# Patient Record
Sex: Male | Born: 1980
Health system: Southern US, Community
[De-identification: ages and names within clinical notes are randomized; demographics above are authoritative.]

## PROBLEM LIST (undated history)

## (undated) DIAGNOSIS — Z91199 Patient's noncompliance with other medical treatment and regimen due to unspecified reason: Secondary | ICD-10-CM

## (undated) DIAGNOSIS — G43909 Migraine, unspecified, not intractable, without status migrainosus: Secondary | ICD-10-CM

## (undated) DIAGNOSIS — E7800 Pure hypercholesterolemia, unspecified: Secondary | ICD-10-CM

## (undated) DIAGNOSIS — I2510 Atherosclerotic heart disease of native coronary artery without angina pectoris: Secondary | ICD-10-CM

## (undated) DIAGNOSIS — N4400 Torsion of testis, unspecified: Secondary | ICD-10-CM

## (undated) DIAGNOSIS — Z9119 Patient's noncompliance with other medical treatment and regimen: Secondary | ICD-10-CM

## (undated) DIAGNOSIS — J45909 Unspecified asthma, uncomplicated: Secondary | ICD-10-CM

## (undated) DIAGNOSIS — I214 Non-ST elevation (NSTEMI) myocardial infarction: Secondary | ICD-10-CM

## (undated) DIAGNOSIS — R7303 Prediabetes: Secondary | ICD-10-CM

## (undated) DIAGNOSIS — I10 Essential (primary) hypertension: Secondary | ICD-10-CM

## (undated) HISTORY — PX: INCISION / DRAINAGE HAND / FINGER: SUR695

## (undated) HISTORY — DX: Prediabetes: R73.03

## (undated) HISTORY — PX: TESTICLE TORSION REDUCTION: SHX795

---

## 2010-10-05 ENCOUNTER — Ambulatory Visit: Payer: Self-pay | Admitting: Diagnostic Radiology

## 2010-10-05 ENCOUNTER — Emergency Department (HOSPITAL_BASED_OUTPATIENT_CLINIC_OR_DEPARTMENT_OTHER): Admission: EM | Admit: 2010-10-05 | Discharge: 2010-10-06 | Payer: Self-pay | Admitting: Emergency Medicine

## 2010-10-09 ENCOUNTER — Emergency Department (HOSPITAL_BASED_OUTPATIENT_CLINIC_OR_DEPARTMENT_OTHER): Admission: EM | Admit: 2010-10-09 | Discharge: 2010-10-09 | Payer: Self-pay | Admitting: Emergency Medicine

## 2010-10-11 ENCOUNTER — Ambulatory Visit (HOSPITAL_COMMUNITY): Admission: AD | Admit: 2010-10-11 | Discharge: 2010-10-11 | Payer: Self-pay | Admitting: Orthopedic Surgery

## 2010-10-11 ENCOUNTER — Encounter: Payer: Self-pay | Admitting: Emergency Medicine

## 2010-10-11 ENCOUNTER — Ambulatory Visit: Payer: Self-pay | Admitting: Diagnostic Radiology

## 2011-03-14 LAB — DIFFERENTIAL
Basophils Absolute: 0.2 10*3/uL — ABNORMAL HIGH (ref 0.0–0.1)
Basophils Relative: 2 % — ABNORMAL HIGH (ref 0–1)
Monocytes Absolute: 1 10*3/uL (ref 0.1–1.0)
Neutro Abs: 6.6 10*3/uL (ref 1.7–7.7)
Neutrophils Relative %: 56 % (ref 43–77)

## 2011-03-14 LAB — ANAEROBIC CULTURE

## 2011-03-14 LAB — CULTURE, ROUTINE-ABSCESS

## 2011-03-14 LAB — CBC
HCT: 43.6 % (ref 39.0–52.0)
MCHC: 33.7 g/dL (ref 30.0–36.0)
RDW: 13.3 % (ref 11.5–15.5)

## 2012-10-17 ENCOUNTER — Encounter (HOSPITAL_BASED_OUTPATIENT_CLINIC_OR_DEPARTMENT_OTHER): Payer: Self-pay | Admitting: *Deleted

## 2012-10-17 ENCOUNTER — Emergency Department (HOSPITAL_BASED_OUTPATIENT_CLINIC_OR_DEPARTMENT_OTHER)
Admission: EM | Admit: 2012-10-17 | Discharge: 2012-10-17 | Disposition: A | Discharge status: Home / Self Care | Payer: Self-pay | Attending: Emergency Medicine | Admitting: Emergency Medicine

## 2012-10-17 DIAGNOSIS — M109 Gout, unspecified: Secondary | ICD-10-CM

## 2012-10-17 DIAGNOSIS — Z885 Allergy status to narcotic agent status: Secondary | ICD-10-CM | POA: Insufficient documentation

## 2012-10-17 HISTORY — DX: Torsion of testis, unspecified: N44.00

## 2012-10-17 MED ORDER — PREDNISONE 20 MG PO TABS
40.0000 mg | ORAL_TABLET | Freq: Once | ORAL | Status: AC
  Administered 2012-10-17: 40 mg via ORAL
  Filled 2012-10-17: qty 2

## 2012-10-17 MED ORDER — HYDROCODONE-ACETAMINOPHEN 5-325 MG PO TABS: ORAL_TABLET | ORAL | Status: DC

## 2012-10-17 MED ORDER — COLCHICINE 0.6 MG PO TABS
1.2000 mg | ORAL_TABLET | Freq: Once | ORAL | Status: AC
  Administered 2012-10-17: 1.2 mg via ORAL
  Filled 2012-10-17: qty 2

## 2012-10-17 MED ORDER — COLCHICINE 0.6 MG PO TABS: 0.6000 mg | ORAL_TABLET | Freq: Once | ORAL | Status: DC

## 2012-10-17 NOTE — ED Notes (Signed)
Pt presents to ED today with left thumb swelling and pain for the last 3 days.

## 2012-10-17 NOTE — ED Provider Notes (Signed)
History     CSN: [id number]  Arrival date & time 10/17/12  2130   First MD Initiated Contact with Patient 10/17/12 [phone]      Chief Complaint  Patient presents with  . Extremity Pain    (Consider location/radiation/quality/duration/timing/severity/associated sxs/prior treatment) HPI Comments: Pain to left thumb, intense, no injury that began about 3 days ago.  Pt has prior h/o abscess to finger, this pain is not similar.  Thumb is swollen, intense pain with light touch, worse with movement of thumb.  No fevers.  No drainage.  Pt is right handed.  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    Patient is a 31 y.o. male presenting with extremity pain. The history is provided by the patient.  Extremity Pain    Past Medical History  Diagnosis Date  . Abscess of finger   . Torsion, testicular     Past Surgical History  Procedure Date  . Incision / drainage hand / finger   . Testicle torsion reduction     History reviewed. No pertinent family history.  History  Substance Use Topics  . Smoking status: Not on file  . Smokeless tobacco: Not on file  . Alcohol Use: Yes      Review of Systems  Constitutional: Negative for fever.  Musculoskeletal: Positive for joint swelling and arthralgias.  Skin: Negative for color change, pallor and wound.  Neurological: Negative for weakness and numbness.    Allergies  Codeine  Home Medications   Current Outpatient Rx  Name Route Sig Dispense Refill  . COLCHICINE 0.6 MG PO TABS Oral Take 1 tablet (0.6 mg total) by mouth once. Take 2 tablets now, then one tablet 1 hour after initial dose.  No more than 3 tablets per attack 6 tablet 0  . HYDROCODONE-ACETAMINOPHEN 5-325 MG PO TABS  1-2 tablets po q 6 hours prn moderate to severe pain 20 tablet 0    BP 165/96  Pulse 80  Temp 97.7 F (36.5 C)  Resp 20  SpO2 100%  Physical Exam  Nursing note and vitals reviewed. Constitutional: He appears well-developed and  well-nourished.  HENT:  Head: Normocephalic.  Pulmonary/Chest: Effort normal. No respiratory distress.  Musculoskeletal: He exhibits tenderness.       Left hand: He exhibits decreased range of motion, tenderness and swelling. He exhibits no bony tenderness, normal capillary refill and no deformity. normal sensation noted. Decreased strength noted. He exhibits thumb/finger opposition.       Hands: Neurological: He is alert.  Skin: Skin is warm and dry. No rash noted.  Psychiatric: He has a normal mood and affect.    ED Course  Procedures (including critical care time)  Labs Reviewed - No data to display No results found.   1. Gout       MDM  Pt's symptoms suggestive of gout.  No injury.  No appearance of cellulitis or abscess at this time.  Although prior h/o abscess, no abrasions, or other obvious violation of skin.  Pt instructed to return or follow up with Dr. Merlyn Lot whom he had seen previously if symptoms worsening.  Pt also instructed to have BP rechecked in future.  Referrals given to various local PCP clinics.          Gavin Pound. Oletta Lamas, MD 10/17/12 2123

## 2012-10-17 NOTE — ED Notes (Signed)
Pt reports no injury or trauma to area.  No redness or deformity noted.  Pt has minimal swelling around joint.  Pt has tried several otc remedies with no relief at home

## 2012-10-17 NOTE — Discharge Instructions (Signed)
 Gout Gout is an inflammatory condition (arthritis) caused by a buildup of uric acid crystals in the joints. Uric acid is a chemical that is normally present in the blood. Under some circumstances, uric acid can form into crystals in your joints. This causes joint redness, soreness, and swelling (inflammation). Repeat attacks are common. Over time, uric acid crystals can form into masses (tophi) near a joint, causing disfigurement. Gout is treatable and often preventable. CAUSES  The disease begins with elevated levels of uric acid in the blood. Uric acid is produced by your body when it breaks down a naturally found substance called purines. This also happens when you eat certain foods such as meats and fish. Causes of an elevated uric acid level include:  Being passed down from parent to child (heredity).  Diseases that cause increased uric acid production (obesity, psoriasis, some cancers).  Excessive alcohol use.  Diet, especially diets rich in meat and seafood.  Medicines, including certain cancer-fighting drugs (chemotherapy), diuretics, and aspirin .  Chronic kidney disease. The kidneys are no longer able to remove uric acid well.  Problems with metabolism. Conditions strongly associated with gout include:  Obesity.  High blood pressure.  High cholesterol.  Diabetes. Not everyone with elevated uric acid levels gets gout. It is not understood why some people get gout and others do not. Surgery, joint injury, and eating too much of certain foods are some of the factors that can lead to gout. SYMPTOMS   An attack of gout comes on quickly. It causes intense pain with redness, swelling, and warmth in a joint.  Fever can occur.  Often, only one joint is involved. Certain joints are more commonly involved:  Base of the big toe.  Knee.  Ankle.  Wrist.  Finger. Without treatment, an attack usually goes away in a few days to weeks. Between attacks, you usually will not have  symptoms, which is different from many other forms of arthritis. DIAGNOSIS  Your caregiver will suspect gout based on your symptoms and exam. Removal of fluid from the joint (arthrocentesis) is done to check for uric acid crystals. Your caregiver will give you a medicine that numbs the area (local anesthetic) and use a needle to remove joint fluid for exam. Gout is confirmed when uric acid crystals are seen in joint fluid, using a special microscope. Sometimes, blood, urine, and X-ray tests are also used. TREATMENT  There are 2 phases to gout treatment: treating the sudden onset (acute) attack and preventing attacks (prophylaxis). Treatment of an Acute Attack  Medicines are used. These include anti-inflammatory medicines or steroid medicines.  An injection of steroid medicine into the affected joint is sometimes necessary.  The painful joint is rested. Movement can worsen the arthritis.  You may use warm or cold treatments on painful joints, depending which works best for you.  Discuss the use of coffee, vitamin C, or cherries with your caregiver. These may be helpful treatment options. Treatment to Prevent Attacks After the acute attack subsides, your caregiver may advise prophylactic medicine. These medicines either help your kidneys eliminate uric acid from your body or decrease your uric acid production. You may need to stay on these medicines for a very long time. The early phase of treatment with prophylactic medicine can be associated with an increase in acute gout attacks. For this reason, during the first few months of treatment, your caregiver may also advise you to take medicines usually used for acute gout treatment. Be sure you understand your caregiver's directions.  You should also discuss dietary treatment with your caregiver. Certain foods such as meats and fish can increase uric acid levels. Other foods such as dairy can decrease levels. Your caregiver can give you a list of foods  to avoid. HOME CARE INSTRUCTIONS   Do not take aspirin  to relieve pain. This raises uric acid levels.  Only take over-the-counter or prescription medicines for pain, discomfort, or fever as directed by your caregiver.  Rest the joint as much as possible. When in bed, keep sheets and blankets off painful areas.  Keep the affected joint raised (elevated).  Use crutches if the painful joint is in your leg.  Drink enough water and fluids to keep your urine clear or pale yellow. This helps your body get rid of uric acid. Do not drink alcoholic beverages. They slow the passage of uric acid.  Follow your caregiver's dietary instructions. Pay careful attention to the amount of protein you eat. Your daily diet should emphasize fruits, vegetables, whole grains, and fat-free or low-fat milk products.  Maintain a healthy body weight. SEEK MEDICAL CARE IF:   You have an oral temperature above 102 F (38.9 C).  You develop diarrhea, vomiting, or any side effects from medicines.  You do not feel better in 24 hours, or you are getting worse. SEEK IMMEDIATE MEDICAL CARE IF:   Your joint becomes suddenly more tender and you have:  Chills.  An oral temperature above 102 F (38.9 C), not controlled by medicine. MAKE SURE YOU:   Understand these instructions.  Will watch your condition.  Will get help right away if you are not doing well or get worse. Document Released: 12/13/2000 Document Revised: 03/09/2012 Document Reviewed: 03/26/2010 [facility] Patient Information 2013 [place], [place].   Narcotic and benzodiazepine use may cause drowsiness, slowed breathing or dependence.  Please use with caution and do not drive, operate machinery or watch young children alone while taking them.  Taking combinations of these medications or drinking alcohol will potentiate these effects.

## 2012-10-19 ENCOUNTER — Emergency Department (HOSPITAL_BASED_OUTPATIENT_CLINIC_OR_DEPARTMENT_OTHER)
Admission: EM | Admit: 2012-10-19 | Discharge: 2012-10-19 | Disposition: A | Discharge status: Home / Self Care | Payer: Self-pay | Attending: Emergency Medicine | Admitting: Emergency Medicine

## 2012-10-19 ENCOUNTER — Encounter (HOSPITAL_BASED_OUTPATIENT_CLINIC_OR_DEPARTMENT_OTHER): Payer: Self-pay | Admitting: Emergency Medicine

## 2012-10-19 DIAGNOSIS — IMO0002 Reserved for concepts with insufficient information to code with codable children: Secondary | ICD-10-CM | POA: Insufficient documentation

## 2012-10-19 DIAGNOSIS — J45909 Unspecified asthma, uncomplicated: Secondary | ICD-10-CM | POA: Insufficient documentation

## 2012-10-19 DIAGNOSIS — F17200 Nicotine dependence, unspecified, uncomplicated: Secondary | ICD-10-CM | POA: Insufficient documentation

## 2012-10-19 DIAGNOSIS — Z79899 Other long term (current) drug therapy: Secondary | ICD-10-CM | POA: Insufficient documentation

## 2012-10-19 HISTORY — DX: Unspecified asthma, uncomplicated: J45.909

## 2012-10-19 MED ORDER — OXYCODONE-ACETAMINOPHEN 5-325 MG PO TABS
1.0000 | ORAL_TABLET | Freq: Once | ORAL | Status: AC
  Administered 2012-10-19: 1 via ORAL
  Filled 2012-10-19 (×2): qty 1

## 2012-10-19 MED ORDER — DOXYCYCLINE HYCLATE 100 MG PO TABS
100.0000 mg | ORAL_TABLET | Freq: Once | ORAL | Status: AC
  Administered 2012-10-19: 100 mg via ORAL
  Filled 2012-10-19: qty 1

## 2012-10-19 MED ORDER — DOXYCYCLINE HYCLATE 100 MG PO CAPS: 100.0000 mg | ORAL_CAPSULE | Freq: Two times a day (BID) | ORAL | Status: DC

## 2012-10-19 MED ORDER — OXYCODONE-ACETAMINOPHEN 5-325 MG PO TABS: ORAL_TABLET | ORAL | Status: DC

## 2012-10-19 NOTE — Progress Notes (Signed)
7:35 PM Pt with pain and swelling of the left thumb, which has now extended up to the thenar eminence.  Thought to be gout 2 days ago, no relief with treatment with colchicine.  Exam shows a felon of the left thumb.  He has seen Dr. Merlyn Lot in the past for a hand infection, so will call Dr. Merrilee Seashore group.

## 2012-10-19 NOTE — ED Notes (Signed)
Pain in left thumb x5 days.  Sts he was seen here 2 days ago and diagnosed with gout.  Med not working.

## 2012-10-19 NOTE — ED Provider Notes (Signed)
History     CSN: [id number]  Arrival date & time 10/19/12  4098   First MD Initiated Contact with Patient 10/19/12 1757      Chief Complaint  Patient presents with  . Hand Pain    (Consider location/radiation/quality/duration/timing/severity/associated sxs/prior treatment) HPI Comments: Pt denies any injury to L thumb.  Was seen here 2 days ago for same and dx with gout.  Taking colchicine and hydrocodone with no improvement or pain relief.  No fever or chills.   R hand dominant.  No other complaints.  Patient is a 31 y.o. male presenting with hand pain. The history is provided by the patient. No language interpreter was used.  Hand Pain This is a new problem. Episode onset: 5 days ago. Pertinent negatives include no chills, fever, joint swelling, numbness or weakness. Exacerbated by: palpation.    Past Medical History  Diagnosis Date  . Abscess of finger   . Torsion, testicular   . Asthma     Past Surgical History  Procedure Date  . Incision / drainage hand / finger   . Testicle torsion reduction     No family history on file.  History  Substance Use Topics  . Smoking status: Current Every Day Smoker -- 0.5 packs/day  . Smokeless tobacco: Never Used  . Alcohol Use: Yes      Review of Systems  Constitutional: Negative for fever and chills.  Musculoskeletal: Negative for joint swelling.       Thumb pain   Neurological: Negative for weakness and numbness.  All other systems reviewed and are negative.    Allergies  Codeine  Home Medications   Current Outpatient Rx  Name Route Sig Dispense Refill  . HYDROCODONE-ACETAMINOPHEN 5-325 MG PO TABS  1-2 tablets po q 6 hours prn moderate to severe pain 20 tablet 0  . COLCHICINE 0.6 MG PO TABS Oral Take 1 tablet (0.6 mg total) by mouth once. Take 2 tablets now, then one tablet 1 hour after initial dose.  No more than 3 tablets per attack 6 tablet 0  . DOXYCYCLINE HYCLATE 100 MG PO CAPS Oral Take 1 capsule (100 mg  total) by mouth 2 (two) times daily. 20 capsule 0  . OXYCODONE-ACETAMINOPHEN 5-325 MG PO TABS  One tab po q 4-6 hrs prn pain 20 tablet 0    BP 172/97  Pulse 74  Temp 98.5 F (36.9 C) (Oral)  Resp 16  Ht 5\' 8"  (1.727 m)  Wt 225 lb (102.059 kg)  BMI 34.21 kg/m2  SpO2 100%  Physical Exam  Nursing note and vitals reviewed. Constitutional: He is oriented to person, place, and time. He appears well-developed and well-nourished.  HENT:  Head: Normocephalic and atraumatic.  Eyes: EOM are normal.  Neck: Normal range of motion.  Cardiovascular: Normal rate, regular rhythm, normal heart sounds and intact distal pulses.   Pulmonary/Chest: Effort normal and breath sounds normal. No respiratory distress.  Abdominal: Soft. He exhibits no distension. There is no tenderness.  Musculoskeletal: He exhibits tenderness.       Hands: Neurological: He is alert and oriented to person, place, and time.  Skin: Skin is warm and dry.  Psychiatric: He has a normal mood and affect. Judgment normal.    ED Course  Procedures (including critical care time)  Labs Reviewed - No data to display No results found. 1945-i spoke with dr. Mina Marble who is on office call for dr. Merlyn Lot.  Pt saw dr. Merlyn Lot last year for a R hand  problem  Dr. Mina Marble recommend doxycyline and office follow up tomorrow.  1. Felon       MDM  rx-doxycycline, 20 Percocet, 20 Call dr. Merlyn Lot tomorrow AM.        Evalina Field, PA 10/19/12 2007

## 2012-10-19 NOTE — ED Notes (Signed)
PA at bedside.

## 2012-10-19 NOTE — ED Notes (Signed)
Pt. Has noted edema in the R thumb with pain...  Pt. Thumb is firm with edema present .

## 2012-10-20 NOTE — ED Provider Notes (Signed)
Medical screening examination/treatment/procedure(s) were conducted as a shared visit with non-physician practitioner(s) and myself.  I personally evaluated the patient during the encounter Pt with pain and swelling of the left thumb, which has now extended up to the thenar eminence. Thought to be gout 2 days ago, no relief with treatment with colchicine. Exam shows a felon of the left thumb. He has seen Dr. Merlyn Lot in the past for a hand infection, so will call Dr. Merrilee Seashore group.       Carleene Cooper III, MD 10/20/12 1235

## 2012-10-21 ENCOUNTER — Encounter (HOSPITAL_COMMUNITY): Payer: Self-pay | Admitting: Surgery

## 2012-10-21 ENCOUNTER — Ambulatory Visit (HOSPITAL_COMMUNITY)
Admission: AD | Admit: 2012-10-21 | Discharge: 2012-10-21 | Disposition: A | Discharge status: Home / Self Care | Payer: Self-pay | Source: Ambulatory Visit | Attending: Orthopedic Surgery | Admitting: Orthopedic Surgery

## 2012-10-21 ENCOUNTER — Other Ambulatory Visit: Payer: Self-pay | Admitting: Orthopedic Surgery

## 2012-10-21 ENCOUNTER — Encounter (HOSPITAL_COMMUNITY): Payer: Self-pay | Admitting: Anesthesiology

## 2012-10-21 ENCOUNTER — Ambulatory Visit (HOSPITAL_COMMUNITY): Payer: Self-pay | Admitting: Anesthesiology

## 2012-10-21 ENCOUNTER — Encounter (HOSPITAL_COMMUNITY)
Admission: AD | Disposition: A | Discharge status: Home / Self Care | Payer: Self-pay | Source: Ambulatory Visit | Attending: Orthopedic Surgery

## 2012-10-21 DIAGNOSIS — IMO0002 Reserved for concepts with insufficient information to code with codable children: Secondary | ICD-10-CM | POA: Diagnosis present

## 2012-10-21 DIAGNOSIS — L03019 Cellulitis of unspecified finger: Secondary | ICD-10-CM | POA: Insufficient documentation

## 2012-10-21 DIAGNOSIS — F17200 Nicotine dependence, unspecified, uncomplicated: Secondary | ICD-10-CM | POA: Insufficient documentation

## 2012-10-21 DIAGNOSIS — L02519 Cutaneous abscess of unspecified hand: Secondary | ICD-10-CM | POA: Insufficient documentation

## 2012-10-21 HISTORY — PX: I & D EXTREMITY: SHX5045

## 2012-10-21 LAB — POCT I-STAT 4, (NA,K, GLUC, HGB,HCT)
Glucose, Bld: 82 mg/dL (ref 70–99)
HCT: 54 % — ABNORMAL HIGH (ref 39.0–52.0)
Hemoglobin: 18.4 g/dL — ABNORMAL HIGH (ref 13.0–17.0)
Potassium: 4 mEq/L (ref 3.5–5.1)
Sodium: 137 mEq/L (ref 135–145)

## 2012-10-21 LAB — SURGICAL PCR SCREEN
MRSA, PCR: POSITIVE — AB
Staphylococcus aureus: POSITIVE — AB

## 2012-10-21 SURGERY — IRRIGATION AND DEBRIDEMENT EXTREMITY
Anesthesia: General | Site: Thumb | Laterality: Left | Wound class: Dirty or Infected

## 2012-10-21 MED ORDER — LACTATED RINGERS IV SOLN
INTRAVENOUS | Status: DC | PRN
  Administered 2012-10-21: 16:00:00 via INTRAVENOUS

## 2012-10-21 MED ORDER — PROMETHAZINE HCL 25 MG/ML IJ SOLN: 6.2500 mg | INTRAMUSCULAR | Status: DC | PRN

## 2012-10-21 MED ORDER — FENTANYL CITRATE 0.05 MG/ML IJ SOLN
INTRAMUSCULAR | Status: AC
  Filled 2012-10-21: qty 2

## 2012-10-21 MED ORDER — CHLORHEXIDINE GLUCONATE 4 % EX LIQD: 60.0000 mL | Freq: Once | CUTANEOUS | Status: DC

## 2012-10-21 MED ORDER — FENTANYL CITRATE 0.05 MG/ML IJ SOLN
25.0000 ug | INTRAMUSCULAR | Status: DC | PRN
  Administered 2012-10-21: 50 ug via INTRAVENOUS

## 2012-10-21 MED ORDER — LACTATED RINGERS IV SOLN: INTRAVENOUS | Status: DC

## 2012-10-21 MED ORDER — MUPIROCIN 2 % EX OINT
TOPICAL_OINTMENT | Freq: Two times a day (BID) | CUTANEOUS | Status: DC
  Filled 2012-10-21: qty 22

## 2012-10-21 MED ORDER — BUPIVACAINE HCL (PF) 0.25 % IJ SOLN
INTRAMUSCULAR | Status: DC | PRN
  Administered 2012-10-21: 4 mL

## 2012-10-21 MED ORDER — LIDOCAINE HCL (CARDIAC) 10 MG/ML IV SOLN
INTRAVENOUS | Status: DC | PRN
  Administered 2012-10-21: 100 mg via INTRAVENOUS

## 2012-10-21 MED ORDER — CEFAZOLIN SODIUM-DEXTROSE 2-3 GM-% IV SOLR
INTRAVENOUS | Status: AC
  Filled 2012-10-21: qty 50

## 2012-10-21 MED ORDER — MEPERIDINE HCL 25 MG/ML IJ SOLN: 6.2500 mg | INTRAMUSCULAR | Status: DC | PRN

## 2012-10-21 MED ORDER — FENTANYL CITRATE 0.05 MG/ML IJ SOLN
INTRAMUSCULAR | Status: DC | PRN
  Administered 2012-10-21 (×2): 50 ug via INTRAVENOUS

## 2012-10-21 MED ORDER — MUPIROCIN 2 % EX OINT
TOPICAL_OINTMENT | CUTANEOUS | Status: AC
  Filled 2012-10-21: qty 22

## 2012-10-21 MED ORDER — CEFAZOLIN SODIUM-DEXTROSE 2-3 GM-% IV SOLR
INTRAVENOUS | Status: DC | PRN
  Administered 2012-10-21: 2 g via INTRAVENOUS

## 2012-10-21 MED ORDER — OXYCODONE-ACETAMINOPHEN 5-325 MG PO TABS: 1.0000 | ORAL_TABLET | ORAL | Status: DC | PRN

## 2012-10-21 MED ORDER — BUPIVACAINE HCL (PF) 0.25 % IJ SOLN
INTRAMUSCULAR | Status: AC
  Filled 2012-10-21: qty 30

## 2012-10-21 MED ORDER — PROPOFOL 10 MG/ML IV BOLUS
INTRAVENOUS | Status: DC | PRN
  Administered 2012-10-21: 300 mg via INTRAVENOUS

## 2012-10-21 MED ORDER — LACTATED RINGERS IV SOLN
INTRAVENOUS | Status: DC
  Administered 2012-10-21: 14:00:00 via INTRAVENOUS

## 2012-10-21 MED ORDER — SODIUM CHLORIDE 0.9 % IR SOLN
Status: DC | PRN
  Administered 2012-10-21: 1

## 2012-10-21 SURGICAL SUPPLY — 49 items
BANDAGE CONFORM 2  STR LF (GAUZE/BANDAGES/DRESSINGS) IMPLANT
BANDAGE ELASTIC 3 VELCRO ST LF (GAUZE/BANDAGES/DRESSINGS) ×2 IMPLANT
BANDAGE ELASTIC 4 VELCRO ST LF (GAUZE/BANDAGES/DRESSINGS) ×2 IMPLANT
BANDAGE GAUZE ELAST BULKY 4 IN (GAUZE/BANDAGES/DRESSINGS) ×4 IMPLANT
BNDG CMPR 9X4 STRL LF SNTH (GAUZE/BANDAGES/DRESSINGS)
BNDG COHESIVE 1X5 TAN STRL LF (GAUZE/BANDAGES/DRESSINGS) ×2 IMPLANT
BNDG ESMARK 4X9 LF (GAUZE/BANDAGES/DRESSINGS) IMPLANT
CLOTH BEACON ORANGE TIMEOUT ST (SAFETY) ×2 IMPLANT
CORDS BIPOLAR (ELECTRODE) ×2 IMPLANT
COVER SURGICAL LIGHT HANDLE (MISCELLANEOUS) ×4 IMPLANT
CUFF TOURNIQUET SINGLE 18IN (TOURNIQUET CUFF) ×2 IMPLANT
DECANTER SPIKE VIAL GLASS SM (MISCELLANEOUS) ×2 IMPLANT
DRAIN PENROSE 1/4X12 LTX STRL (WOUND CARE) IMPLANT
DRAPE OEC MINIVIEW 54X84 (DRAPES) IMPLANT
DRAPE SURG 17X23 STRL (DRAPES) ×2 IMPLANT
DRSG PAD ABDOMINAL 8X10 ST (GAUZE/BANDAGES/DRESSINGS) ×4 IMPLANT
DURAPREP 26ML APPLICATOR (WOUND CARE) IMPLANT
ELECT REM PT RETURN 9FT ADLT (ELECTROSURGICAL)
ELECTRODE REM PT RTRN 9FT ADLT (ELECTROSURGICAL) IMPLANT
GAUZE PACKING IODOFORM 1/4X5 (PACKING) ×2 IMPLANT
GAUZE XEROFORM 1X8 LF (GAUZE/BANDAGES/DRESSINGS) ×2 IMPLANT
GLOVE BIO SURGEON STRL SZ8.5 (GLOVE) ×2 IMPLANT
GOWN PREVENTION PLUS XLARGE (GOWN DISPOSABLE) ×2 IMPLANT
GOWN STRL NON-REIN LRG LVL3 (GOWN DISPOSABLE) ×2 IMPLANT
HANDPIECE INTERPULSE COAX TIP (DISPOSABLE)
KIT BASIN OR (CUSTOM PROCEDURE TRAY) ×2 IMPLANT
KIT ROOM TURNOVER OR (KITS) ×2 IMPLANT
MANIFOLD NEPTUNE II (INSTRUMENTS) ×2 IMPLANT
NEEDLE HYPO 25GX1X1/2 BEV (NEEDLE) IMPLANT
NEEDLE HYPO 25X1 1.5 SAFETY (NEEDLE) ×2 IMPLANT
NS IRRIG 1000ML POUR BTL (IV SOLUTION) ×2 IMPLANT
PACK ORTHO EXTREMITY (CUSTOM PROCEDURE TRAY) ×2 IMPLANT
PAD ARMBOARD 7.5X6 YLW CONV (MISCELLANEOUS) ×4 IMPLANT
PAD CAST 4YDX4 CTTN HI CHSV (CAST SUPPLIES) ×2 IMPLANT
PADDING CAST COTTON 4X4 STRL (CAST SUPPLIES) ×4
SET HNDPC FAN SPRY TIP SCT (DISPOSABLE) IMPLANT
SPONGE GAUZE 4X4 12PLY (GAUZE/BANDAGES/DRESSINGS) ×2 IMPLANT
SPONGE LAP 18X18 X RAY DECT (DISPOSABLE) ×2 IMPLANT
SUCTION FRAZIER TIP 10 FR DISP (SUCTIONS) ×2 IMPLANT
SUT VICRYL RAPIDE 4/0 PS 2 (SUTURE) ×2 IMPLANT
SYR 20CC LL (SYRINGE) IMPLANT
SYR CONTROL 10ML LL (SYRINGE) ×2 IMPLANT
TOWEL OR 17X24 6PK STRL BLUE (TOWEL DISPOSABLE) ×2 IMPLANT
TOWEL OR 17X26 10 PK STRL BLUE (TOWEL DISPOSABLE) ×2 IMPLANT
TUBE ANAEROBIC SPECIMEN COL (MISCELLANEOUS) ×2 IMPLANT
TUBE CONNECTING 12X1/4 (SUCTIONS) ×2 IMPLANT
UNDERPAD 30X30 INCONTINENT (UNDERPADS AND DIAPERS) ×2 IMPLANT
WATER STERILE IRR 1000ML POUR (IV SOLUTION) ×2 IMPLANT
YANKAUER SUCT BULB TIP NO VENT (SUCTIONS) ×2 IMPLANT

## 2012-10-21 NOTE — Transfer of Care (Signed)
Immediate Anesthesia Transfer of Care Note  Patient: Mike Frazier  Procedure(s) Performed: Procedure(s) (LRB) with comments: IRRIGATION AND DEBRIDEMENT EXTREMITY (Left)  Patient Location: PACU  Anesthesia Type: General  Level of Consciousness: awake, alert , oriented and patient cooperative  Airway & Oxygen Therapy: Patient Spontanous Breathing and Patient connected to nasal cannula oxygen  Post-op Assessment: Report given to PACU RN, Post -op Vital signs reviewed and stable and Patient moving all extremities  Post vital signs: Reviewed and stable  Complications: No apparent anesthesia complications

## 2012-10-21 NOTE — Anesthesia Postprocedure Evaluation (Signed)
  Anesthesia Post-op Note  Patient: Mike Frazier  Procedure(s) Performed: Procedure(s) (LRB) with comments: IRRIGATION AND DEBRIDEMENT EXTREMITY (Left)  Patient Location: PACU  Anesthesia Type: General  Level of Consciousness: awake  Airway and Oxygen Therapy: Patient Spontanous Breathing  Post-op Pain: mild  Post-op Assessment: Post-op Vital signs reviewed  Post-op Vital Signs: Reviewed  Complications: No apparent anesthesia complications

## 2012-10-21 NOTE — Anesthesia Postprocedure Evaluation (Signed)
  Anesthesia Post-op Note  Patient: Mike Frazier  Procedure(s) Performed: Procedure(s) (LRB) with comments: IRRIGATION AND DEBRIDEMENT EXTREMITY (Left)  Patient Location: PACU  Anesthesia Type: General  Level of Consciousness: awake, oriented and patient cooperative  Airway and Oxygen Therapy: Patient Spontanous Breathing  Post-op Pain: mild  Post-op Assessment: Post-op Vital signs reviewed, Patient's Cardiovascular Status Stable, Respiratory Function Stable, Patent Airway, No signs of Nausea or vomiting and Pain level controlled  Post-op Vital Signs: stable  Complications: No apparent anesthesia complications

## 2012-10-21 NOTE — H&P (Signed)
Mike Frazier is an 31 y.o. male.   Chief Complaint: left thumb pain and swelling HPI: as above with 36 h/o of left thumb volar pain and swelling despite po abx  Past Medical History  Diagnosis Date  . Abscess of finger   . Torsion, testicular   . Asthma     Past Surgical History  Procedure Date  . Incision / drainage hand / finger   . Testicle torsion reduction     No family history on file. Social History:  reports that he has been smoking.  He has never used smokeless tobacco. He reports that he drinks alcohol. He reports that he uses illicit drugs (Marijuana).  Allergies:  Allergies  Allergen Reactions  . Codeine     Medications Prior to Admission  Medication Sig Dispense Refill  . colchicine 0.6 MG tablet Take 1 tablet (0.6 mg total) by mouth once. Take 2 tablets now, then one tablet 1 hour after initial dose.  No more than 3 tablets per attack  6 tablet  0  . doxycycline (VIBRAMYCIN) 100 MG capsule Take 1 capsule (100 mg total) by mouth 2 (two) times daily.  20 capsule  0  . HYDROcodone-acetaminophen (NORCO/VICODIN) 5-325 MG per tablet 1-2 tablets po q 6 hours prn moderate to severe pain  20 tablet  0  . oxyCODONE-acetaminophen (PERCOCET/ROXICET) 5-325 MG per tablet One tab po q 4-6 hrs prn pain  20 tablet  0    No results found for this or any previous visit (from the past 48 hour(s)). No results found.  Review of Systems  All other systems reviewed and are negative.    Blood pressure 131/82, pulse 77, temperature 98.7 F (37.1 C), temperature source Oral, resp. rate 16, height 5' 8.5" (1.74 m), weight 100.869 kg (222 lb 6 oz), SpO2 99.00%. Physical Exam  Constitutional: He is oriented to person, place, and time. He appears well-developed and well-nourished.  HENT:  Head: Normocephalic and atraumatic.  Cardiovascular: Normal rate.   Respiratory: Effort normal.  Musculoskeletal:       Hands: Neurological: He is alert and oriented to person, place, and  time.  Skin: Skin is warm.  Psychiatric: He has a normal mood and affect. His behavior is normal. Judgment and thought content normal.     Assessment/Plan                                  As above   Plan I and D Mike Frazier A 10/21/2012, 12:36 PM

## 2012-10-21 NOTE — Brief Op Note (Signed)
10/21/2012  4:04 PM  PATIENT:  Mike Frazier  31 y.o. male  PRE-OPERATIVE DIAGNOSIS:  Left thumb infection  POST-OPERATIVE DIAGNOSIS:  Left thumb infection  PROCEDURE:  Procedure(s) (LRB) with comments: IRRIGATION AND DEBRIDEMENT EXTREMITY (Left)  SURGEON:  Surgeon(s) and Role:    * Marlowe Shores, MD - Primary  PHYSICIAN ASSISTANT:   ASSISTANTS: none   ANESTHESIA:   general  EBL:     BLOOD ADMINISTERED:none  DRAINS: none   LOCAL MEDICATIONS USED:  MARCAINE   5cc  SPECIMEN:  No Specimen  DISPOSITION OF SPECIMEN:  N/A  COUNTS:  YES  TOURNIQUET:   Total Tourniquet Time Documented: Forearm (Left) - 7 minutes  DICTATION: .Other Dictation: Dictation Number [id number]  PLAN OF CARE: Discharge to home after PACU  PATIENT DISPOSITION:  PACU - hemodynamically stable.   Delay start of Pharmacological VTE agent (>24hrs) due to surgical blood loss or risk of bleeding: not applicable

## 2012-10-21 NOTE — Anesthesia Preprocedure Evaluation (Signed)
Anesthesia Evaluation  Patient identified by MRN, date of birth, ID band Patient awake    Reviewed: Allergy & Precautions, H&P , NPO status , Patient's Chart, lab work & pertinent test results  Airway Mallampati: II TM Distance: >3 FB Neck ROM: Full    Dental No notable dental hx.    Pulmonary neg pulmonary ROS, asthma , Current Smoker,  breath sounds clear to auscultation  Pulmonary exam normal       Cardiovascular negative cardio ROS  Rhythm:Regular Rate:Normal     Neuro/Psych negative neurological ROS  negative psych ROS   GI/Hepatic negative GI ROS, Neg liver ROS,   Endo/Other  negative endocrine ROS  Renal/GU negative Renal ROS  negative genitourinary   Musculoskeletal negative musculoskeletal ROS (+)   Abdominal   Peds negative pediatric ROS (+)  Hematology negative hematology ROS (+)   Anesthesia Other Findings   Reproductive/Obstetrics negative OB ROS                           Anesthesia Physical Anesthesia Plan  ASA: II  Anesthesia Plan: General   Post-op Pain Management:    Induction: Intravenous  Airway Management Planned: LMA  Additional Equipment:   Intra-op Plan:   Post-operative Plan: Extubation in OR  Informed Consent: I have reviewed the patients History and Physical, chart, labs and discussed the procedure including the risks, benefits and alternatives for the proposed anesthesia with the patient or authorized representative who has indicated his/her understanding and acceptance.   Dental advisory given  Plan Discussed with: CRNA  Anesthesia Plan Comments:         Anesthesia Quick Evaluation

## 2012-10-21 NOTE — Addendum Note (Signed)
Addendum  created 10/21/12 1712 by Judie Petit, MD   Modules edited:Notes Section

## 2012-10-21 NOTE — Op Note (Signed)
See notre [id number]

## 2012-10-22 MED FILL — Mupirocin Oint 2%: CUTANEOUS | Qty: 22 | Status: AC

## 2012-10-22 NOTE — Op Note (Signed)
Mike Frazier, Mike Frazier            ACCOUNT NO.:  [id number]  MEDICAL RECORD NO.:  [id number]  LOCATION:  [facility]                         FACILITY:  [facility]  PHYSICIAN:  Artist Pais. Vernee Baines, M.D.DATE OF BIRTH:  August 25, 1981  DATE OF PROCEDURE:  10/21/2012 DATE OF DISCHARGE:  10/21/2012                              OPERATIVE REPORT   PREOPERATIVE DIAGNOSIS:  Deep abscess, left thumb.  POSTOPERATIVE DIAGNOSIS:  Deep abscess, left thumb.  PROCEDURES:  Incision and drainage above.  SURGEON:  Artist Pais. Mina Marble, M.D.  ASSISTANT:  None.  ANESTHESIA:  General.  Cultures x2 sent.  Wound packed open with quarter-inch Iodoform gauze.  COMPLICATIONS:  No complication.  DRAINS:  No drains.  DESCRIPTION OF PROCEDURE:  The patient was taken to the operating suite. After induction of adequate general anesthesia, left upper extremity was prepped and draped in sterile fashion.  An Esmarch was used to exsanguinate the limb.  Tourniquet was inflated to 250 mmHg.  At this point in time, midlateral incision was made over the radial border of the left thumb.  Skin was incised 2 cm.  Dissection was carried down to the volar pad.  Purulence was encountered and cultured.  It was thoroughly this incised and drained with a liter of normal saline.  It was packed open with quarter-inch Iodoform gauze and dressed with 4x4s and Coban wrap.  The patient tolerated the procedure well and went to the recovery room in a stable fashion.     Artist Pais Mina Marble, M.D.     MAW/MEDQ  D:  10/21/2012  T:  10/22/2012  Job:  [id number]

## 2012-10-23 ENCOUNTER — Encounter (HOSPITAL_COMMUNITY): Payer: Self-pay | Admitting: Orthopedic Surgery

## 2012-10-24 LAB — WOUND CULTURE

## 2012-10-26 LAB — ANAEROBIC CULTURE

## 2012-12-04 LAB — AFB CULTURE WITH SMEAR (NOT AT ARMC): Acid Fast Smear: NONE SEEN

## 2013-03-03 ENCOUNTER — Emergency Department (HOSPITAL_BASED_OUTPATIENT_CLINIC_OR_DEPARTMENT_OTHER)
Admission: EM | Admit: 2013-03-03 | Discharge: 2013-03-03 | Disposition: A | Discharge status: Home / Self Care | Payer: Self-pay | Attending: Emergency Medicine | Admitting: Emergency Medicine

## 2013-03-03 ENCOUNTER — Encounter (HOSPITAL_BASED_OUTPATIENT_CLINIC_OR_DEPARTMENT_OTHER): Payer: Self-pay | Admitting: Emergency Medicine

## 2013-03-03 DIAGNOSIS — L02419 Cutaneous abscess of limb, unspecified: Secondary | ICD-10-CM | POA: Insufficient documentation

## 2013-03-03 DIAGNOSIS — Z872 Personal history of diseases of the skin and subcutaneous tissue: Secondary | ICD-10-CM | POA: Insufficient documentation

## 2013-03-03 DIAGNOSIS — J45909 Unspecified asthma, uncomplicated: Secondary | ICD-10-CM | POA: Insufficient documentation

## 2013-03-03 DIAGNOSIS — F17200 Nicotine dependence, unspecified, uncomplicated: Secondary | ICD-10-CM | POA: Insufficient documentation

## 2013-03-03 DIAGNOSIS — Z87448 Personal history of other diseases of urinary system: Secondary | ICD-10-CM | POA: Insufficient documentation

## 2013-03-03 MED ORDER — HYDROCODONE-ACETAMINOPHEN 5-325 MG PO TABS
2.0000 | ORAL_TABLET | Freq: Once | ORAL | Status: AC
  Administered 2013-03-03: 2 via ORAL

## 2013-03-03 MED ORDER — SULFAMETHOXAZOLE-TRIMETHOPRIM 800-160 MG PO TABS: 1.0000 | ORAL_TABLET | Freq: Two times a day (BID) | ORAL | Status: DC

## 2013-03-03 MED ORDER — LIDOCAINE HCL (PF) 1 % IJ SOLN
INTRAMUSCULAR | Status: AC
  Administered 2013-03-03: 22:00:00
  Filled 2013-03-03: qty 5

## 2013-03-03 MED ORDER — HYDROCODONE-ACETAMINOPHEN 5-325 MG PO TABS: 1.0000 | ORAL_TABLET | ORAL | Status: DC | PRN

## 2013-03-03 MED ORDER — HYDROCODONE-ACETAMINOPHEN 5-325 MG PO TABS
ORAL_TABLET | ORAL | Status: AC
  Administered 2013-03-03: 2 via ORAL
  Filled 2013-03-03: qty 2

## 2013-03-03 NOTE — ED Provider Notes (Addendum)
History     CSN: [id number]  Arrival date & time 03/03/13  1919   First MD Initiated Contact with Patient 03/03/13 2056      Chief Complaint  Patient presents with  . Abscess    (Consider location/radiation/quality/duration/timing/severity/associated sxs/prior treatment) Patient is a 32 y.o. male presenting with abscess. The history is provided by the patient. No language interpreter was used.  Abscess Location:  Leg Leg abscess location:  R upper leg Abscess quality: induration, painful and redness   Abscess quality: not draining   Red streaking: no   Progression:  Worsening Pain details:    Timing:  Constant   Progression:  Worsening Relieved by:  Nothing   Past Medical History  Diagnosis Date  . Abscess of finger   . Torsion, testicular   . Asthma     Past Surgical History  Procedure Laterality Date  . Incision / drainage hand / finger    . Testicle torsion reduction    . I&d extremity  10/21/2012    Procedure: IRRIGATION AND DEBRIDEMENT EXTREMITY;  Surgeon: Marlowe Shores, MD;  Location: [facility] OR;  Service: Orthopedics;  Laterality: Left;    No family history on file.  History  Substance Use Topics  . Smoking status: Current Every Day Smoker -- 0.50 packs/day  . Smokeless tobacco: Never Used  . Alcohol Use: No      Review of Systems  All other systems reviewed and are negative.    Allergies  Codeine  Home Medications   Current Outpatient Rx  Name  Route  Sig  Dispense  Refill  . colchicine 0.6 MG tablet   Oral   Take 1 tablet (0.6 mg total) by mouth once. Take 2 tablets now, then one tablet 1 hour after initial dose.  No more than 3 tablets per attack   6 tablet   0   . doxycycline (VIBRAMYCIN) 100 MG capsule   Oral   Take 1 capsule (100 mg total) by mouth 2 (two) times daily.   20 capsule   0   . HYDROcodone-acetaminophen (NORCO) 5-325 MG per tablet   Oral   Take 1 tablet by mouth every 4 (four) hours as needed for pain.   15  tablet   0   . HYDROcodone-acetaminophen (NORCO/VICODIN) 5-325 MG per tablet      1-2 tablets po q 6 hours prn moderate to severe pain   20 tablet   0   . oxyCODONE-acetaminophen (PERCOCET/ROXICET) 5-325 MG per tablet      One tab po q 4-6 hrs prn pain   20 tablet   0   . oxyCODONE-acetaminophen (ROXICET) 5-325 MG per tablet   Oral   Take 1 tablet by mouth every 4 (four) hours as needed for pain.   30 tablet   0   . sulfamethoxazole-trimethoprim (SEPTRA DS) 800-160 MG per tablet   Oral   Take 1 tablet by mouth every 12 (twelve) hours.   10 tablet   0     BP 181/97  Pulse 94  Temp(Src) 98.9 F (37.2 C) (Oral)  Resp 18  Ht 5' 8.5" (1.74 m)  Wt 230 lb (104.327 kg)  BMI 34.46 kg/m2  SpO2 100%  Physical Exam  Nursing note and vitals reviewed. Constitutional: He is oriented to person, place, and time. He appears well-developed.  Cardiovascular: Normal rate.   Pulmonary/Chest: Effort normal.  Musculoskeletal: He exhibits tenderness.  Swollen tender abscess right inner thigh  Neurological: He is alert and  oriented to person, place, and time. He has normal reflexes.  Skin: There is erythema.  Psychiatric: He has a normal mood and affect.    ED Course  INCISION AND DRAINAGE Date/Time: 03/03/2013 10:20 PM Performed by: Elson Areas Authorized by: Elson Areas Consent: Verbal consent obtained. Risks and benefits: risks, benefits and alternatives were discussed Consent given by: patient Patient understanding: patient states understanding of the procedure being performed Required items: required blood products, implants, devices, and special equipment available Patient identity confirmed: verbally with patient Type: abscess Body area: lower extremity Location details: right leg Anesthesia: local infiltration Local anesthetic: lidocaine 2% with epinephrine Patient sedated: no Scalpel size: 11 Incision type: single straight Complexity: simple Drainage:  purulent Drainage amount: moderate Wound treatment: wound left open Packing material: 1/4 in iodoform gauze Patient tolerance: Patient tolerated the procedure well with no immediate complications.   (including critical care time)  Labs Reviewed - No data to display No results found.   1. Abscess       MDM          Elson Areas, PA-C 03/03/13 2221  Lonia Skinner Deale, PA-C 03/17/13 2252

## 2013-03-03 NOTE — ED Provider Notes (Signed)
Medical screening examination/treatment/procedure(s) were conducted as a shared visit with non-physician practitioner(s) and myself.  I personally evaluated the patient during the encounter   Loren Racer, MD 03/03/13 2314

## 2013-03-03 NOTE — ED Notes (Signed)
Pt c/o abscess on inner upper right leg

## 2013-03-07 ENCOUNTER — Emergency Department (HOSPITAL_BASED_OUTPATIENT_CLINIC_OR_DEPARTMENT_OTHER)
Admission: EM | Admit: 2013-03-07 | Discharge: 2013-03-07 | Disposition: A | Discharge status: Home / Self Care | Payer: Self-pay | Attending: Emergency Medicine | Admitting: Emergency Medicine

## 2013-03-07 ENCOUNTER — Encounter (HOSPITAL_BASED_OUTPATIENT_CLINIC_OR_DEPARTMENT_OTHER): Payer: Self-pay | Admitting: *Deleted

## 2013-03-07 DIAGNOSIS — J45909 Unspecified asthma, uncomplicated: Secondary | ICD-10-CM | POA: Insufficient documentation

## 2013-03-07 DIAGNOSIS — L02419 Cutaneous abscess of limb, unspecified: Secondary | ICD-10-CM | POA: Insufficient documentation

## 2013-03-07 DIAGNOSIS — Z87448 Personal history of other diseases of urinary system: Secondary | ICD-10-CM | POA: Insufficient documentation

## 2013-03-07 DIAGNOSIS — Z872 Personal history of diseases of the skin and subcutaneous tissue: Secondary | ICD-10-CM | POA: Insufficient documentation

## 2013-03-07 DIAGNOSIS — F17200 Nicotine dependence, unspecified, uncomplicated: Secondary | ICD-10-CM | POA: Insufficient documentation

## 2013-03-07 NOTE — ED Notes (Signed)
Pt states he was seen here Wed and had packing placed in groin wound. States feels better.

## 2013-03-07 NOTE — ED Provider Notes (Signed)
History/physical exam/procedure(s) were performed by non-physician practitioner and as supervising physician I was immediately available for consultation/collaboration. I have reviewed all notes and am in agreement with care and plan.   Danielle S Ray, MD 03/07/13 1920 

## 2013-03-07 NOTE — ED Provider Notes (Signed)
History     CSN: [id number]  Arrival date & time 03/07/13  1447   First MD Initiated Contact with Patient 03/07/13 1641      Chief Complaint  Patient presents with  . Wound Check    (Consider location/radiation/quality/duration/timing/severity/associated sxs/prior treatment) HPI Patient presents to the emergency department for recheck of abscess.  Patient, states, that the area is draining.  Patient denies fevers, nausea, vomiting, or syncope. Past Medical History  Diagnosis Date  . Abscess of finger   . Torsion, testicular   . Asthma     Past Surgical History  Procedure Laterality Date  . Incision / drainage hand / finger    . Testicle torsion reduction    . I&d extremity  10/21/2012    Procedure: IRRIGATION AND DEBRIDEMENT EXTREMITY;  Surgeon: Marlowe Shores, MD;  Location: [facility] OR;  Service: Orthopedics;  Laterality: Left;    History reviewed. No pertinent family history.  History  Substance Use Topics  . Smoking status: Current Every Day Smoker -- 0.50 packs/day  . Smokeless tobacco: Never Used  . Alcohol Use: No      Review of Systems All other systems negative except as documented in the HPI. All pertinent positives and negatives as reviewed in the HPI. Allergies  Codeine  Home Medications   Current Outpatient Rx  Name  Route  Sig  Dispense  Refill  . colchicine 0.6 MG tablet   Oral   Take 1 tablet (0.6 mg total) by mouth once. Take 2 tablets now, then one tablet 1 hour after initial dose.  No more than 3 tablets per attack   6 tablet   0   . doxycycline (VIBRAMYCIN) 100 MG capsule   Oral   Take 1 capsule (100 mg total) by mouth 2 (two) times daily.   20 capsule   0   . HYDROcodone-acetaminophen (NORCO) 5-325 MG per tablet   Oral   Take 1 tablet by mouth every 4 (four) hours as needed for pain.   15 tablet   0   . HYDROcodone-acetaminophen (NORCO/VICODIN) 5-325 MG per tablet      1-2 tablets po q 6 hours prn moderate to severe pain   20  tablet   0   . oxyCODONE-acetaminophen (PERCOCET/ROXICET) 5-325 MG per tablet      One tab po q 4-6 hrs prn pain   20 tablet   0   . oxyCODONE-acetaminophen (ROXICET) 5-325 MG per tablet   Oral   Take 1 tablet by mouth every 4 (four) hours as needed for pain.   30 tablet   0   . sulfamethoxazole-trimethoprim (SEPTRA DS) 800-160 MG per tablet   Oral   Take 1 tablet by mouth every 12 (twelve) hours.   10 tablet   0     BP 166/89  Pulse 98  Temp(Src) 98.6 F (37 C) (Oral)  Resp 20  Ht 5\' 8"  (1.727 m)  Wt 230 lb (104.327 kg)  BMI 34.98 kg/m2  SpO2 100%  Physical Exam  Nursing note and vitals reviewed. Constitutional: He appears well-developed and well-nourished.  Musculoskeletal:       Legs:   ED Course  Procedures (including critical care time)  Packing was removed from the abscess area.  Still has some induration noted in the area.  Patient is advised to use heat around the area and also advised warm soaks.  Told to return here as needed   MDM  Carlyle Dolly, PA-C 03/07/13 1751

## 2013-03-29 ENCOUNTER — Encounter (HOSPITAL_BASED_OUTPATIENT_CLINIC_OR_DEPARTMENT_OTHER): Payer: Self-pay | Admitting: *Deleted

## 2013-03-29 ENCOUNTER — Emergency Department (HOSPITAL_BASED_OUTPATIENT_CLINIC_OR_DEPARTMENT_OTHER)
Admission: EM | Admit: 2013-03-29 | Discharge: 2013-03-30 | Disposition: A | Discharge status: Home / Self Care | Payer: Self-pay | Attending: Emergency Medicine | Admitting: Emergency Medicine

## 2013-03-29 DIAGNOSIS — Z79899 Other long term (current) drug therapy: Secondary | ICD-10-CM | POA: Insufficient documentation

## 2013-03-29 DIAGNOSIS — L03119 Cellulitis of unspecified part of limb: Secondary | ICD-10-CM | POA: Insufficient documentation

## 2013-03-29 DIAGNOSIS — L738 Other specified follicular disorders: Secondary | ICD-10-CM | POA: Insufficient documentation

## 2013-03-29 DIAGNOSIS — L02419 Cutaneous abscess of limb, unspecified: Secondary | ICD-10-CM | POA: Insufficient documentation

## 2013-03-29 DIAGNOSIS — L0390 Cellulitis, unspecified: Secondary | ICD-10-CM

## 2013-03-29 DIAGNOSIS — J45909 Unspecified asthma, uncomplicated: Secondary | ICD-10-CM | POA: Insufficient documentation

## 2013-03-29 DIAGNOSIS — Z872 Personal history of diseases of the skin and subcutaneous tissue: Secondary | ICD-10-CM | POA: Insufficient documentation

## 2013-03-29 DIAGNOSIS — F17200 Nicotine dependence, unspecified, uncomplicated: Secondary | ICD-10-CM | POA: Insufficient documentation

## 2013-03-29 DIAGNOSIS — Z87448 Personal history of other diseases of urinary system: Secondary | ICD-10-CM | POA: Insufficient documentation

## 2013-03-29 NOTE — ED Provider Notes (Signed)
History    This chart was scribed for Hilario Quarry, MD by Marlyne Beards, ED Scribe. The patient was seen in room MH07/MH07. Patient's care was started at 11:24 PM.    CSN: [id number]  Arrival date & time 03/29/13  2227   First MD Initiated Contact with Patient 03/29/13 2324      Chief Complaint  Patient presents with  . Abscess    (Consider location/radiation/quality/duration/timing/severity/associated sxs/prior treatment) Patient is a 32 y.o. male presenting with abscess. The history is provided by the patient. No language interpreter was used.  Abscess  Mike Frazier is a 32 y.o. male who presents to the Emergency Department complaining of moderate constant irritation due to an abscess on his upper right thigh near scrotum onset yesterday. Pt states movement exacerbates the pain. Pt had a previous abscess that was treated March 2014 at [facility] which got better. Pt denies fever, chills, cough, nausea, vomiting, diarrhea, SOB, weakness, and any other associated symptoms. Pt Smokes tobacco daily. Pt is allergic to codeine.  Past Medical History  Diagnosis Date  . Abscess of finger   . Torsion, testicular   . Asthma     Past Surgical History  Procedure Laterality Date  . Incision / drainage hand / finger    . Testicle torsion reduction    . I&d extremity  10/21/2012    Procedure: IRRIGATION AND DEBRIDEMENT EXTREMITY;  Surgeon: Marlowe Shores, MD;  Location: [facility] OR;  Service: Orthopedics;  Laterality: Left;    No family history on file.  History  Substance Use Topics  . Smoking status: Current Every Day Smoker -- 0.50 packs/day  . Smokeless tobacco: Never Used  . Alcohol Use: No      Review of Systems  Skin: Positive for rash.  All other systems reviewed and are negative.    Allergies  Codeine  Home Medications   Current Outpatient Rx  Name  Route  Sig  Dispense  Refill  . colchicine 0.6 MG tablet   Oral   Take 1 tablet (0.6 mg total) by  mouth once. Take 2 tablets now, then one tablet 1 hour after initial dose.  No more than 3 tablets per attack   6 tablet   0   . doxycycline (VIBRAMYCIN) 100 MG capsule   Oral   Take 1 capsule (100 mg total) by mouth 2 (two) times daily.   20 capsule   0   . HYDROcodone-acetaminophen (NORCO) 5-325 MG per tablet   Oral   Take 1 tablet by mouth every 4 (four) hours as needed for pain.   15 tablet   0   . HYDROcodone-acetaminophen (NORCO/VICODIN) 5-325 MG per tablet      1-2 tablets po q 6 hours prn moderate to severe pain   20 tablet   0   . oxyCODONE-acetaminophen (PERCOCET/ROXICET) 5-325 MG per tablet      One tab po q 4-6 hrs prn pain   20 tablet   0   . oxyCODONE-acetaminophen (ROXICET) 5-325 MG per tablet   Oral   Take 1 tablet by mouth every 4 (four) hours as needed for pain.   30 tablet   0   . sulfamethoxazole-trimethoprim (SEPTRA DS) 800-160 MG per tablet   Oral   Take 1 tablet by mouth every 12 (twelve) hours.   10 tablet   0     BP 150/84  Pulse 92  Temp(Src) 99.2 F (37.3 C) (Oral)  Resp 18  Wt 230  lb (104.327 kg)  BMI 34.98 kg/m2  SpO2 95%  Physical Exam  Nursing note and vitals reviewed. Constitutional: He is oriented to person, place, and time. He appears well-developed and well-nourished. No distress.  HENT:  Head: Normocephalic and atraumatic.  Eyes: Conjunctivae and EOM are normal. Pupils are equal, round, and reactive to light.  Neck: Normal range of motion. Neck supple. No tracheal deviation present.  Cardiovascular: Normal rate, regular rhythm, normal heart sounds and intact distal pulses.   Pulmonary/Chest: Effort normal. No respiratory distress.  Abdominal: Soft. Bowel sounds are normal. There is no tenderness. There is no guarding.  Musculoskeletal: Normal range of motion.  Neurological: He is alert and oriented to person, place, and time.  Skin: Skin is warm and dry. There is erythema.  indurated area on the medial posterior of  the right thigh. 5 cm by 8 cm. No drainage no discharge.  Psychiatric: He has a normal mood and affect. His behavior is normal.    ED Course  Procedures (including critical care time) DIAGNOSTIC STUDIES: Oxygen Saturation is 95% on room air, adequate by my interpretation.    COORDINATION OF CARE: 11:38 PM Discussed ED treatment with pt and pt agrees.     Labs Reviewed - No data to display No results found.   No diagnosis found.    MDM  Folliculitis, no evidence of abscess.       I personally performed the services described in this documentation, which was scribed in my presence. The recorded information has been reviewed and considered.   Hilario Quarry, MD 03/30/13 [phone]

## 2013-03-29 NOTE — ED Notes (Signed)
Abscess right upper leg. Same thing 2 weeks ago.

## 2013-03-30 MED ORDER — DOXYCYCLINE HYCLATE 100 MG PO CAPS: 100.0000 mg | ORAL_CAPSULE | Freq: Two times a day (BID) | ORAL | Status: DC

## 2013-03-30 MED ORDER — DOXYCYCLINE HYCLATE 100 MG PO TABS
100.0000 mg | ORAL_TABLET | Freq: Once | ORAL | Status: AC
  Administered 2013-03-30: 100 mg via ORAL
  Filled 2013-03-30: qty 1

## 2013-03-30 NOTE — ED Provider Notes (Signed)
Medical screening examination/treatment/procedure(s) were performed by non-physician practitioner and as supervising physician I was immediately available for consultation/collaboration.   Loren Racer, MD 03/30/13 [phone]

## 2016-01-01 ENCOUNTER — Emergency Department (HOSPITAL_BASED_OUTPATIENT_CLINIC_OR_DEPARTMENT_OTHER): Payer: Self-pay

## 2016-01-01 ENCOUNTER — Emergency Department (HOSPITAL_BASED_OUTPATIENT_CLINIC_OR_DEPARTMENT_OTHER)
Admission: EM | Admit: 2016-01-01 | Discharge: 2016-01-01 | Disposition: A | Discharge status: Home / Self Care | Payer: Self-pay | Attending: Emergency Medicine | Admitting: Emergency Medicine

## 2016-01-01 ENCOUNTER — Encounter (HOSPITAL_BASED_OUTPATIENT_CLINIC_OR_DEPARTMENT_OTHER): Payer: Self-pay | Admitting: *Deleted

## 2016-01-01 DIAGNOSIS — M25571 Pain in right ankle and joints of right foot: Secondary | ICD-10-CM

## 2016-01-01 DIAGNOSIS — S99911A Unspecified injury of right ankle, initial encounter: Secondary | ICD-10-CM | POA: Insufficient documentation

## 2016-01-01 DIAGNOSIS — Z87438 Personal history of other diseases of male genital organs: Secondary | ICD-10-CM | POA: Insufficient documentation

## 2016-01-01 DIAGNOSIS — Y998 Other external cause status: Secondary | ICD-10-CM | POA: Insufficient documentation

## 2016-01-01 DIAGNOSIS — J45909 Unspecified asthma, uncomplicated: Secondary | ICD-10-CM | POA: Insufficient documentation

## 2016-01-01 DIAGNOSIS — Z792 Long term (current) use of antibiotics: Secondary | ICD-10-CM | POA: Insufficient documentation

## 2016-01-01 DIAGNOSIS — Z872 Personal history of diseases of the skin and subcutaneous tissue: Secondary | ICD-10-CM | POA: Insufficient documentation

## 2016-01-01 DIAGNOSIS — Z79899 Other long term (current) drug therapy: Secondary | ICD-10-CM | POA: Insufficient documentation

## 2016-01-01 DIAGNOSIS — Y9389 Activity, other specified: Secondary | ICD-10-CM | POA: Insufficient documentation

## 2016-01-01 DIAGNOSIS — Y9289 Other specified places as the place of occurrence of the external cause: Secondary | ICD-10-CM | POA: Insufficient documentation

## 2016-01-01 DIAGNOSIS — F17200 Nicotine dependence, unspecified, uncomplicated: Secondary | ICD-10-CM | POA: Insufficient documentation

## 2016-01-01 DIAGNOSIS — X58XXXA Exposure to other specified factors, initial encounter: Secondary | ICD-10-CM | POA: Insufficient documentation

## 2016-01-01 MED ORDER — IBUPROFEN 800 MG PO TABS
800.0000 mg | ORAL_TABLET | Freq: Once | ORAL | Status: AC
  Administered 2016-01-01: 800 mg via ORAL
  Filled 2016-01-01: qty 1

## 2016-01-01 NOTE — ED Notes (Addendum)
Woke yesterday with pain in his right foot. Worse in his heel. No injury.

## 2016-01-01 NOTE — ED Provider Notes (Signed)
CSN: [id number]     Arrival date & time 01/01/16  1530 History  By signing my name below, I, Mike Frazier, attest that this documentation has been prepared under the direction and in the presence of Margarita Grizzleanielle Rilda Bulls, MD. Electronically Signed: Budd PalmerVanessa Frazier, ED Scribe. 01/01/2016. 6:32 PM.     Chief Complaint  Patient presents with  . Foot Injury   The history is provided by the patient. No language interpreter was used.   HPI Comments: Mike Frazier is a 35 y.o. male smoker at 0.5 ppd with a PMHx of asthma who presents to the Emergency Department complaining of  An injury to the right foot sustained 1 day ago. He reports associated aching, worsening, constant inner right foot pain from the right inner foot radiating up through the heel and ankle. He notes exacerbation of the pain with weight bearing and pressure, as well as when curling his toes. He has not tried taking anything for this. He notes his blood pressure has also been high recently, but that he is not on any medication for this. He does not currently have a PCP. He denies a PMHx of gout. Pt denies swelling to the area.   Past Medical History  Diagnosis Date  . Abscess of finger   . Torsion, testicular   . Asthma    Past Surgical History  Procedure Laterality Date  . Incision / drainage hand / finger    . Testicle torsion reduction    . I&d extremity  10/21/2012    Procedure: IRRIGATION AND DEBRIDEMENT EXTREMITY;  Surgeon: Marlowe ShoresMatthew A Weingold, MD;  Location: [facility] OR;  Service: Orthopedics;  Laterality: Left;   No family history on file. Social History  Substance Use Topics  . Smoking status: Current Every Day Smoker -- 0.50 packs/day  . Smokeless tobacco: Never Used  . Alcohol Use: No    Review of Systems  Musculoskeletal: Positive for myalgias and arthralgias.  All other systems reviewed and are negative.   Allergies  Codeine  Home Medications   Prior to Admission medications   Medication Sig Start Date End  Date Taking? Authorizing Provider  colchicine 0.6 MG tablet Take 1 tablet (0.6 mg total) by mouth once. Take 2 tablets now, then one tablet 1 hour after initial dose.  No more than 3 tablets per attack 10/17/12   Quita SkyeMichael Ghim, MD  doxycycline (VIBRAMYCIN) 100 MG capsule Take 1 capsule (100 mg total) by mouth 2 (two) times daily. 10/19/12   Richard Paul HalfM Miller, PA-C  doxycycline (VIBRAMYCIN) 100 MG capsule Take 1 capsule (100 mg total) by mouth 2 (two) times daily. 03/30/13   Margarita Grizzleanielle Kielee Care, MD  HYDROcodone-acetaminophen (NORCO) 5-325 MG per tablet Take 1 tablet by mouth every 4 (four) hours as needed for pain. 03/03/13   Loren Raceravid Yelverton, MD  HYDROcodone-acetaminophen (NORCO/VICODIN) 5-325 MG per tablet 1-2 tablets po q 6 hours prn moderate to severe pain 10/17/12   Quita SkyeMichael Ghim, MD  oxyCODONE-acetaminophen (PERCOCET/ROXICET) 5-325 MG per tablet One tab po q 4-6 hrs prn pain 10/19/12   Worthy Rancherichard M Miller, PA-C  oxyCODONE-acetaminophen (ROXICET) 5-325 MG per tablet Take 1 tablet by mouth every 4 (four) hours as needed for pain. 10/21/12   Dairl PonderMatthew Weingold, MD  sulfamethoxazole-trimethoprim (SEPTRA DS) 800-160 MG per tablet Take 1 tablet by mouth every 12 (twelve) hours. 03/03/13   Loren Raceravid Yelverton, MD   BP 186/101 mmHg  Pulse 72  Temp(Src) 98.6 F (37 C) (Oral)  Resp 18  Ht 5' 8.5" (1.74 m)  Wt 230 lb (104.327 kg)  BMI 34.46 kg/m2  SpO2 100% Physical Exam  Constitutional: He is oriented to person, place, and time. He appears well-developed.  HENT:  Head: Normocephalic and atraumatic.  Right Ear: External ear normal.  Left Ear: External ear normal.  Nose: Nose normal.  Eyes: EOM are normal.  Neck: No tracheal deviation present.  Pulmonary/Chest: Effort normal.  Musculoskeletal: Normal range of motion.       Feet:  No redness, swelling, fluctuance.   Neurological: He is alert and oriented to person, place, and time.  Skin: Skin is warm and dry.  Psychiatric: He has a normal mood and affect. His  behavior is normal.  Nursing note and vitals reviewed.   ED Course  Procedures  DIAGNOSTIC STUDIES: Oxygen Saturation is 100% on RA, normal by my interpretation.    COORDINATION OF CARE: 6:29 PM - Discussed XR results. Discussed plans to order anti-inflammatory medication. Pt advised of plan for treatment and pt agrees.  Labs Review Labs Reviewed - No data to display  Imaging Review Dg Foot Complete Right  01/01/2016  CLINICAL DATA:  Right heel pain starting yesterday EXAM: RIGHT FOOT COMPLETE - 3+ VIEW COMPARISON:  None FINDINGS: Small Achilles calcaneal spur. Minimal spurring along the anterior tibial rim. No other significant findings identified. IMPRESSION: 1. Minimal Achilles calcaneal spur and minimal spurring along the anterior tibial rim, otherwise normal exam. Electronically Signed   By: Gaylyn Rong M.D.   On: 01/01/2016 16:35   I have personally reviewed and evaluated these images and lab results as part of my medical decision-making.   EKG Interpretation None      MDM   Final diagnoses:  Acute right ankle pain   I personally performed the services described in this documentation, which was scribed in my presence. The recorded information has been reviewed and considered.    Margarita Grizzle, MD 01/01/16 2325

## 2016-01-01 NOTE — Discharge Instructions (Signed)

## 2016-01-01 NOTE — ED Notes (Signed)
Patient transported to X-ray 

## 2016-09-27 ENCOUNTER — Encounter (HOSPITAL_BASED_OUTPATIENT_CLINIC_OR_DEPARTMENT_OTHER): Payer: Self-pay | Admitting: *Deleted

## 2016-09-27 ENCOUNTER — Emergency Department (HOSPITAL_BASED_OUTPATIENT_CLINIC_OR_DEPARTMENT_OTHER): Payer: Self-pay

## 2016-09-27 ENCOUNTER — Inpatient Hospital Stay (HOSPITAL_BASED_OUTPATIENT_CLINIC_OR_DEPARTMENT_OTHER)
Admission: EM | Admit: 2016-09-27 | Discharge: 2016-10-02 | DRG: 247 | Disposition: A | Discharge status: Home / Self Care | Payer: Self-pay | Attending: Internal Medicine | Admitting: Internal Medicine

## 2016-09-27 DIAGNOSIS — E7800 Pure hypercholesterolemia, unspecified: Secondary | ICD-10-CM | POA: Diagnosis present

## 2016-09-27 DIAGNOSIS — Z955 Presence of coronary angioplasty implant and graft: Secondary | ICD-10-CM

## 2016-09-27 DIAGNOSIS — E876 Hypokalemia: Secondary | ICD-10-CM | POA: Diagnosis present

## 2016-09-27 DIAGNOSIS — F17210 Nicotine dependence, cigarettes, uncomplicated: Secondary | ICD-10-CM | POA: Diagnosis present

## 2016-09-27 DIAGNOSIS — Z8249 Family history of ischemic heart disease and other diseases of the circulatory system: Secondary | ICD-10-CM

## 2016-09-27 DIAGNOSIS — I214 Non-ST elevation (NSTEMI) myocardial infarction: Principal | ICD-10-CM | POA: Diagnosis present

## 2016-09-27 DIAGNOSIS — I200 Unstable angina: Secondary | ICD-10-CM

## 2016-09-27 DIAGNOSIS — I25110 Atherosclerotic heart disease of native coronary artery with unstable angina pectoris: Secondary | ICD-10-CM | POA: Diagnosis present

## 2016-09-27 DIAGNOSIS — R079 Chest pain, unspecified: Secondary | ICD-10-CM | POA: Diagnosis present

## 2016-09-27 DIAGNOSIS — R072 Precordial pain: Secondary | ICD-10-CM

## 2016-09-27 DIAGNOSIS — I10 Essential (primary) hypertension: Secondary | ICD-10-CM | POA: Diagnosis present

## 2016-09-27 DIAGNOSIS — R778 Other specified abnormalities of plasma proteins: Secondary | ICD-10-CM

## 2016-09-27 DIAGNOSIS — J45909 Unspecified asthma, uncomplicated: Secondary | ICD-10-CM | POA: Diagnosis present

## 2016-09-27 DIAGNOSIS — R739 Hyperglycemia, unspecified: Secondary | ICD-10-CM | POA: Diagnosis present

## 2016-09-27 DIAGNOSIS — R7989 Other specified abnormal findings of blood chemistry: Secondary | ICD-10-CM

## 2016-09-27 LAB — CBC WITH DIFFERENTIAL/PLATELET
BASOS PCT: 0 %
Basophils Absolute: 0 10*3/uL (ref 0.0–0.1)
EOS ABS: 0.2 10*3/uL (ref 0.0–0.7)
EOS PCT: 3 %
HCT: 45.4 % (ref 39.0–52.0)
HEMOGLOBIN: 15.2 g/dL (ref 13.0–17.0)
LYMPHS ABS: 3.4 10*3/uL (ref 0.7–4.0)
Lymphocytes Relative: 44 %
MCH: 29.1 pg (ref 26.0–34.0)
MCHC: 33.5 g/dL (ref 30.0–36.0)
MCV: 87 fL (ref 78.0–100.0)
MONO ABS: 0.7 10*3/uL (ref 0.1–1.0)
MONOS PCT: 9 %
NEUTROS PCT: 44 %
Neutro Abs: 3.5 10*3/uL (ref 1.7–7.7)
PLATELETS: 250 10*3/uL (ref 150–400)
RBC: 5.22 MIL/uL (ref 4.22–5.81)
RDW: 14 % (ref 11.5–15.5)
WBC: 7.8 10*3/uL (ref 4.0–10.5)

## 2016-09-27 LAB — D-DIMER, QUANTITATIVE (NOT AT ARMC)

## 2016-09-27 MED ORDER — ASPIRIN 81 MG PO CHEW
324.0000 mg | CHEWABLE_TABLET | Freq: Once | ORAL | Status: AC
  Administered 2016-09-27: 324 mg via ORAL
  Filled 2016-09-27: qty 4

## 2016-09-27 MED ORDER — NITROGLYCERIN 0.4 MG SL SUBL: 0.4000 mg | SUBLINGUAL_TABLET | SUBLINGUAL | Status: DC | PRN

## 2016-09-27 NOTE — ED Provider Notes (Signed)
[facility] Provider Note: Lowella Dell, MD, FACEP  CSN: [id number] MRN: [id number] ARRIVAL: 09/27/16 at 2312  CHIEF COMPLAINT  Chest Pain  HISTORY OF PRESENT ILLNESS  Mike Frazier is a 35 y.o. male who presents to the Emergency Department complaining of intermittent left-sided chest pain onset a couple of days ago. His pain is described as sharp and lasts for about two minutes at a time. It is severe enough that the pain almost brought him to his knees earlier. No alleviating or exacerbating factors noted. He also complains of associated SOB which has improved. Pt denies any pain with inspiration, nausea, sweating or jaw pain. He is pain-free presently.   He has been having pain in his left upper first molar which is known to be carious.  Past Medical History:  Diagnosis Date  . Abscess of finger   . Asthma   . Torsion, testicular    Past Surgical History:  Procedure Laterality Date  . I&D EXTREMITY  10/21/2012   Procedure: IRRIGATION AND DEBRIDEMENT EXTREMITY;  Surgeon: Marlowe Shores, MD;  Location: [facility] OR;  Service: Orthopedics;  Laterality: Left;  . INCISION / DRAINAGE HAND / FINGER    . TESTICLE TORSION REDUCTION      History reviewed. No pertinent family history.  Social History  Substance Use Topics  . Smoking status: Current Every Day Smoker    Packs/day: 0.50  . Smokeless tobacco: Never Used  . Alcohol use No    Prior to Admission medications   Medication Sig Start Date End Date Taking? Authorizing Provider  sulfamethoxazole-trimethoprim (SEPTRA DS) 800-160 MG per tablet Take 1 tablet by mouth every 12 (twelve) hours. 03/03/13   Loren Racer, MD   Allergies Codeine  REVIEW OF SYSTEMS  Negative except as noted here or in the History of Present Illness.  PHYSICAL EXAMINATION  Initial Vital Signs Blood pressure 179/93, pulse 64, temperature 97.8 F (36.6 C), resp. rate 16, height 5\' 9"  (1.753 m), weight 230 lb (104.3 kg), SpO2 99  %.  Examination General: Well-developed, well-nourished male in no acute distress; appearance consistent with age of record HENT: normocephalic; atraumatic; carious left upper first molar Eyes: pupils equal, round and reactive to light; extraocular muscles intact Neck: supple Heart: regular rate and rhythm; no murmurs, rubs or gallops Lungs: clear to auscultation bilaterally Abdomen: soft; nondistended; nontender; no masses or hepatosplenomegaly; bowel sounds present aExtremities: No deformity; full range of motion; pulses normal Neurologic: Awake, alert and oriented; motor function intact in all extremities and symmetric; no facial droop Skin: Warm and dry Psychiatric: Normal mood and affect   RESULTS  Summary of this visit's results, reviewed by myself:   EKG Interpretation  Date/Time:  Friday September 27 2016 23:53:13 EDT Ventricular Rate:  68 PR Interval:    QRS Duration: 118 QT Interval:  398 QTC Calculation: 424 R Axis:   -57 Text Interpretation:  Sinus rhythm Nonspecific IVCD with LAD Inferior infarct, old Minimal ST elevation, anterior leads Lateral leads are also involved No old tracing to compare Confirmed by Jandi Swiger  MD, Jonny Ruiz ([id number]) on 09/27/2016 11:58:42 PM      Laboratory Studies: Results for orders placed or performed during the hospital encounter of 09/27/16 (from the past 24 hour(s))  CBC with Differential/Platelet     Status: None   Collection Time: 09/27/16 11:43 PM  Result Value Ref Range   WBC 7.8 4.0 - 10.5 K/uL   RBC 5.22 4.22 - 5.81 MIL/uL   Hemoglobin 15.2 13.0 -  17.0 g/dL   HCT 16.145.4 09.639.0 - 04.552.0 %   MCV 87.0 78.0 - 100.0 fL   MCH 29.1 26.0 - 34.0 pg   MCHC 33.5 30.0 - 36.0 g/dL   RDW 40.914.0 81.111.5 - 91.415.5 %   Platelets 250 150 - 400 K/uL   Neutrophils Relative % 44 %   Neutro Abs 3.5 1.7 - 7.7 K/uL   Lymphocytes Relative 44 %   Lymphs Abs 3.4 0.7 - 4.0 K/uL   Monocytes Relative 9 %   Monocytes Absolute 0.7 0.1 - 1.0 K/uL   Eosinophils Relative 3  %   Eosinophils Absolute 0.2 0.0 - 0.7 K/uL   Basophils Relative 0 %   Basophils Absolute 0.0 0.0 - 0.1 K/uL  Basic metabolic panel     Status: Abnormal   Collection Time: 09/27/16 11:43 PM  Result Value Ref Range   Sodium 136 135 - 145 mmol/L   Potassium 3.4 (L) 3.5 - 5.1 mmol/L   Chloride 102 101 - 111 mmol/L   CO2 26 22 - 32 mmol/L   Glucose, Bld 182 (H) 65 - 99 mg/dL   BUN 12 6 - 20 mg/dL   Creatinine, Ser 7.821.09 0.61 - 1.24 mg/dL   Calcium 9.0 8.9 - 95.610.3 mg/dL   GFR calc non Af Amer >60 >60 mL/min   GFR calc Af Amer >60 >60 mL/min   Anion gap 8 5 - 15  Troponin I     Status: Abnormal   Collection Time: 09/27/16 11:43 PM  Result Value Ref Range   Troponin I 0.03 (HH) <0.03 ng/mL  D-dimer, quantitative (not [facility])     Status: None   Collection Time: 09/27/16 11:43 PM  Result Value Ref Range   D-Dimer, Quant <0.27 0.00 - 0.50 ug/mL-FEU  Troponin I     Status: Abnormal   Collection Time: 09/28/16  2:50 AM  Result Value Ref Range   Troponin I 0.03 (HH) <0.03 ng/mL   Imaging Studies: Dg Chest 2 View  Result Date: 09/27/2016 CLINICAL DATA:  Acute onset of generalized chest pain and shortness of breath on exertion. Initial encounter. EXAM: CHEST  2 VIEW COMPARISON:  Chest radiograph performed 05/25/2016 FINDINGS: The lungs are well-aerated. Pulmonary vascularity is at the upper limits of normal. There is no evidence of focal opacification, pleural effusion or pneumothorax. The heart is normal in size; the mediastinal contour is within normal limits. No acute osseous abnormalities are seen. IMPRESSION: No acute cardiopulmonary process seen. Electronically Signed   By: Roanna RaiderJeffery  Chang M.D.   On: 09/27/2016 23:57    ED COURSE  Nursing notes and initial vitals signs, including pulse oximetry, reviewed.  Vitals:   09/27/16 2320 09/27/16 2321 09/28/16 0000 09/28/16 0130  BP:  179/93 168/88 154/88  Pulse:  64 70 71  Resp:  16 19 14   Temp:  97.8 F (36.6 C)    SpO2:  99% 97% 98%   Weight: 230 lb (104.3 kg)     Height: 5\' 9"  (1.753 m)      3:28 AM Patient pain-free. Second troponin is the same as the first troponin and right on the borderline between normal and elevated. [facility]  PROCEDURES    ED DIAGNOSES     ICD-9-CM ICD-10-CM   1. Intermittent chest pain 786.50 R07.9   2. Elevated troponin level 790.6 R79.89      I personally performed the services described in this documentation, which was scribed in my presence.  The recorded information has been reviewed and is accurate.    Paula Libra, MD 09/28/16 [phone]

## 2016-09-27 NOTE — ED Triage Notes (Signed)
Pt c/o left sided chest pain with sob x 3 days

## 2016-09-28 ENCOUNTER — Encounter (HOSPITAL_COMMUNITY): Payer: Self-pay | Admitting: Family Medicine

## 2016-09-28 DIAGNOSIS — E876 Hypokalemia: Secondary | ICD-10-CM

## 2016-09-28 DIAGNOSIS — R778 Other specified abnormalities of plasma proteins: Secondary | ICD-10-CM | POA: Diagnosis present

## 2016-09-28 DIAGNOSIS — R7989 Other specified abnormal findings of blood chemistry: Secondary | ICD-10-CM

## 2016-09-28 DIAGNOSIS — I10 Essential (primary) hypertension: Secondary | ICD-10-CM

## 2016-09-28 DIAGNOSIS — R079 Chest pain, unspecified: Secondary | ICD-10-CM | POA: Diagnosis present

## 2016-09-28 LAB — TROPONIN I
TROPONIN I: 0.03 ng/mL — AB (ref <0.03)
TROPONIN I: 0.03 ng/mL — AB (ref <0.03)

## 2016-09-28 LAB — BASIC METABOLIC PANEL
ANION GAP: 8 (ref 5–15)
BUN: 12 mg/dL (ref 6–20)
CHLORIDE: 102 mmol/L (ref 101–111)
CO2: 26 mmol/L (ref 22–32)
Calcium: 9 mg/dL (ref 8.9–10.3)
Creatinine, Ser: 1.09 mg/dL (ref 0.61–1.24)
GFR calc Af Amer: >60 mL/min (ref >60)
Glucose, Bld: 182 mg/dL — ABNORMAL HIGH (ref 65–99)
POTASSIUM: 3.4 mmol/L — AB (ref 3.5–5.1)
SODIUM: 136 mmol/L (ref 135–145)

## 2016-09-28 LAB — LIPID PANEL
CHOLESTEROL: 172 mg/dL (ref 0–200)
HDL: 54 mg/dL (ref >40)
LDL Cholesterol: 105 mg/dL — ABNORMAL HIGH (ref 0–99)
Total CHOL/HDL Ratio: 3.2 RATIO
Triglycerides: 64 mg/dL (ref <150)
VLDL: 13 mg/dL (ref 0–40)

## 2016-09-28 LAB — HEPARIN LEVEL (UNFRACTIONATED): HEPARIN UNFRACTIONATED: 0.46 [IU]/mL (ref 0.30–0.70)

## 2016-09-28 LAB — MRSA PCR SCREENING: MRSA by PCR: POSITIVE — AB

## 2016-09-28 MED ORDER — POTASSIUM CHLORIDE 10 MEQ/100ML IV SOLN: 10.0000 meq | INTRAVENOUS | Status: DC

## 2016-09-28 MED ORDER — MUPIROCIN 2 % EX OINT
1.0000 "application " | TOPICAL_OINTMENT | Freq: Two times a day (BID) | CUTANEOUS | Status: DC
  Administered 2016-09-28 – 2016-10-01 (×8): 1 via NASAL
  Filled 2016-09-28 (×5): qty 22

## 2016-09-28 MED ORDER — HEPARIN BOLUS VIA INFUSION
4000.0000 [IU] | Freq: Once | INTRAVENOUS | Status: AC
  Administered 2016-09-28: 4000 [IU] via INTRAVENOUS
  Filled 2016-09-28: qty 4000

## 2016-09-28 MED ORDER — POTASSIUM CHLORIDE CRYS ER 20 MEQ PO TBCR
40.0000 meq | EXTENDED_RELEASE_TABLET | Freq: Once | ORAL | Status: AC
  Administered 2016-09-28: 40 meq via ORAL
  Filled 2016-09-28: qty 2

## 2016-09-28 MED ORDER — CHLORHEXIDINE GLUCONATE CLOTH 2 % EX PADS
6.0000 | MEDICATED_PAD | Freq: Every day | CUTANEOUS | Status: AC
  Administered 2016-09-28 – 2016-10-02 (×5): 6 via TOPICAL

## 2016-09-28 MED ORDER — ACETAMINOPHEN 325 MG PO TABS
650.0000 mg | ORAL_TABLET | ORAL | Status: DC | PRN
  Administered 2016-09-28: 650 mg via ORAL
  Filled 2016-09-28 (×2): qty 2

## 2016-09-28 MED ORDER — AMLODIPINE BESYLATE 5 MG PO TABS
5.0000 mg | ORAL_TABLET | Freq: Every day | ORAL | Status: DC
  Administered 2016-09-28 – 2016-10-02 (×5): 5 mg via ORAL
  Filled 2016-09-28 (×5): qty 1

## 2016-09-28 MED ORDER — GI COCKTAIL ~~LOC~~: 30.0000 mL | Freq: Four times a day (QID) | ORAL | Status: DC | PRN

## 2016-09-28 MED ORDER — ENOXAPARIN SODIUM 40 MG/0.4ML ~~LOC~~ SOLN: 40.0000 mg | Freq: Every day | SUBCUTANEOUS | Status: DC

## 2016-09-28 MED ORDER — HEPARIN (PORCINE) IN NACL 100-0.45 UNIT/ML-% IJ SOLN
1250.0000 [IU]/h | INTRAMUSCULAR | Status: DC
  Administered 2016-09-28 (×2): 1250 [IU]/h via INTRAVENOUS
  Filled 2016-09-28 (×3): qty 250

## 2016-09-28 MED ORDER — ONDANSETRON HCL 4 MG/2ML IJ SOLN: 4.0000 mg | Freq: Four times a day (QID) | INTRAMUSCULAR | Status: DC | PRN

## 2016-09-28 NOTE — Progress Notes (Signed)
ANTICOAGULATION CONSULT NOTE - Initial Consult  Pharmacy Consult for heparin Indication: chest pain/ACS/STEMI  Allergies  Allergen Reactions  . Codeine    Patient Measurements: Height: 5\' 9"  (175.3 cm) Weight: 230 lb (104.3 kg) IBW/kg (Calculated) : 70.7 Heparin Dosing Weight: 93 kg   Vital Signs: Temp: 98.2 F (36.8 C) (09/30 0520) Temp Source: Oral (09/30 0520) BP: 157/99 (09/30 0903) Pulse Rate: 51 (09/30 0520)  Labs:  Recent Labs  09/27/16 2343 09/28/16 0250 09/28/16 0634  HGB 15.2  --   --   HCT 45.4  --   --   PLT 250  --   --   CREATININE 1.09  --   --   TROPONINI 0.03* 0.03* <0.03    Estimated Creatinine Clearance: 110.4 mL/min (by C-G formula based on SCr of 1.09 mg/dL).  Medical History: Past Medical History:  Diagnosis Date  . Abscess of finger   . Asthma   . Torsion, testicular    Assessment: 35 yo male admitted with chest pain and no prior cardiac history, however, has strong family history of CAD. Pharmacy consulted to dose heparin for ACS/STEMI. No oral anticoagulation PTA. CBC stable and within normal limits. No overt signs or symptoms of bleeding noted.   Goal of Therapy:  Heparin level 0.3-0.7 units/ml Monitor platelets by anticoagulation protocol: Yes   Plan:  Give 4000 units bolus x 1 Start heparin infusion at 1250 units/hr Discontinue SQ lovenox  Check anti-Xa level in 6 hours and daily while on heparin Continue to monitor H&H and platelets  York CeriseKatherine Cook, PharmD Pharmacy Resident  Pager [phone] 09/28/16 9:36 AM

## 2016-09-28 NOTE — Progress Notes (Signed)
ANTICOAGULATION CONSULT NOTE - Follow Up Consult  Pharmacy Consult for heparin Indication: chest pain/ACS  Allergies  Allergen Reactions  . Codeine Shortness Of Breath    Patient Measurements: Height: 5\' 9"  (175.3 cm) Weight: 230 lb (104.3 kg) IBW/kg (Calculated) : 70.7 Heparin Dosing Weight: 93 kg  Vital Signs: Temp: 97.8 F (36.6 C) (09/30 1400) Temp Source: Oral (09/30 1400) BP: 144/88 (09/30 1400) Pulse Rate: 61 (09/30 1400)  Labs:  Recent Labs  09/27/16 2343  09/28/16 0634 09/28/16 0935 09/28/16 1246 09/28/16 1620  HGB 15.2  --   --   --   --   --   HCT 45.4  --   --   --   --   --   PLT 250  --   --   --   --   --   HEPARINUNFRC  --   --   --   --   --  0.46  CREATININE 1.09  --   --   --   --   --   TROPONINI 0.03*  < > <0.03 <0.03 <0.03  --   < > = values in this interval not displayed.  Estimated Creatinine Clearance: 110.4 mL/min (by C-G formula based on SCr of 1.09 mg/dL).   Medications:  Prescriptions Prior to Admission  Medication Sig Dispense Refill Last Dose  . albuterol (PROVENTIL HFA;VENTOLIN HFA) 108 (90 Base) MCG/ACT inhaler Inhale 2 puffs into the lungs every 6 (six) hours as needed for wheezing or shortness of breath (asthma).   1-2 months ago  . ibuprofen (ADVIL,MOTRIN) 200 MG tablet Take 800 mg by mouth 2 (two) times daily as needed for headache (pain).   few days ago  . [DISCONTINUED] sulfamethoxazole-trimethoprim (SEPTRA DS) 800-160 MG per tablet Take 1 tablet by mouth every 12 (twelve) hours. 10 tablet 0     Assessment: 35 yo M admitted 09/27/2016 with chest pain and no prior cardiac history. Pharmacy consulted to dose heparin for ACS. Not on any anticoagulation PTA. Treating as unstable angina.  Heparin level 0.46 (therapeutic), Hgb 15.2, Plt wnl. No s/sx of bleeding noted.  Goal of Therapy:  Heparin level 0.3-0.7 units/ml  Monitor platelets by anticoagulation protocol: Yes    Plan:  - Continue heparin 1250 units/hr - Monitor  daily heparin level, CBC and s/sx of bleeding  Casilda Carlsaylor Aliha Diedrich, PharmD. PGY-2 Infectious Diseases Pharmacy Resident Pager: [phone] 09/28/2016,5:32 PM

## 2016-09-28 NOTE — Progress Notes (Signed)
PROGRESS NOTE  Mike Frazier AVW:[id number] DOB: June 19, 1981 DOA: 09/27/2016 PCP: Pcp Not In System  Brief History:  35 year old male with a history of undiagnosed hypertension presented with intermittent chest pain that began on 09/24/2016. The patient states that he has had different times of sensations from sharp pain to dull ache. He states that he has had onset of the chest discomfort with exertion and pushing furniture as well as at rest. He had some associated shortness of breath, but denied any diaphoresis, nausea, presyncopal symptoms. He denies any recent long travels or malignancy. The patient states that he had a sister who had an MI in her mid 85s. The patient does not take any prescription medicines and has not seen a physician in many years. Initial troponins were minimally elevated at 0.03. EKG showed sinus rhythm with IVCD and some repolarization abnormalities. Cardiology was consulted to assist with management.  Assessment/Plan: Chest Pain -pt with atypical and typical components -given pt's family hx of premature coronary disease, consult cardiology -troponin 0.03 x 2 -personally reviewed EKG--sinus with nonspecific IVCD, repol abnormality -personally reviewed CXR--neg  HTN -started amlodipine  Bradycardia -no AV block on EKG -tele  Hypokalemia -replete -check mag   Disposition Plan:   Home when cleared by cardiology Family Communication:  No Family at bedside--  Consultants:  Cardiology  Code Status:  FULL  DVT Prophylaxis:  [place] Lovenox   Procedures: As Listed in Progress Note Above  Antibiotics: None    Subjective: Patient denies fevers, chills, headache, chest pain, dyspnea, nausea, vomiting, diarrhea, abdominal pain, dysuria, hematuria, hematochezia, and melena.   Objective: Vitals:   09/28/16 0230 09/28/16 0300 09/28/16 0330 09/28/16 0520  BP: 150/94 155/92 (!) 167/103 (!) 182/96  Pulse: (!) 58 (!) 51 (!) 54 (!) 51  Resp: 14 14  15 16   Temp:    98.2 F (36.8 C)  TempSrc:    Oral  SpO2: 97% 100% 98% 99%  Weight:      Height:       No intake or output data in the 24 hours ending 09/28/16 0807 Weight change:  Exam:   General:  Pt is alert, follows commands appropriately, not in acute distress  HEENT: No icterus, No thrush, No neck mass, [place]/AT  Cardiovascular: RRR, S1/S2, no rubs, no gallops  Respiratory: CTA bilaterally, no wheezing, no crackles, no rhonchi  Abdomen: Soft/+BS, non tender, non distended, no guarding  Extremities: No edema, No lymphangitis, No petechiae, No rashes, no synovitis   Data Reviewed: I have personally reviewed following labs and imaging studies Basic Metabolic Panel:  Recent Labs Lab 09/27/16 2343  NA 136  K 3.4*  CL 102  CO2 26  GLUCOSE 182*  BUN 12  CREATININE 1.09  CALCIUM 9.0   Liver Function Tests: No results for input(s): AST, ALT, ALKPHOS, BILITOT, PROT, ALBUMIN in the last 168 hours. No results for input(s): LIPASE, AMYLASE in the last 168 hours. No results for input(s): AMMONIA in the last 168 hours. Coagulation Profile: No results for input(s): INR, PROTIME in the last 168 hours. CBC:  Recent Labs Lab 09/27/16 2343  WBC 7.8  NEUTROABS 3.5  HGB 15.2  HCT 45.4  MCV 87.0  PLT 250   Cardiac Enzymes:  Recent Labs Lab 09/27/16 2343 09/28/16 0250  TROPONINI 0.03* 0.03*   BNP: Invalid input(s): POCBNP CBG: No results for input(s): GLUCAP in the last 168 hours. HbA1C: No results for input(s): HGBA1C in the  last 72 hours. Urine analysis: No results found for: COLORURINE, APPEARANCEUR, LABSPEC, PHURINE, GLUCOSEU, HGBUR, BILIRUBINUR, KETONESUR, PROTEINUR, UROBILINOGEN, NITRITE, LEUKOCYTESUR Sepsis Labs: @LABRCNTIP (procalcitonin:4,lacticidven:4) ) Recent Results (from the past 240 hour(s))  MRSA PCR Screening     Status: Abnormal   Collection Time: 09/28/16  5:45 AM  Result Value Ref Range Status   MRSA by PCR POSITIVE (A) NEGATIVE Final      Comment:        The GeneXpert MRSA Assay (FDA approved for NASAL specimens only), is one component of a comprehensive MRSA colonization surveillance program. It is not intended to diagnose MRSA infection nor to guide or monitor treatment for MRSA infections. RESULT CALLED TO, READ BACK BY AND VERIFIED WITH: D. DUVALL RN 8:05 09/28/16 ([email or web address])      Scheduled Meds: . amLODipine  5 mg Oral Daily  . Chlorhexidine Gluconate Cloth  6 each Topical Q0600  . enoxaparin (LOVENOX) injection  40 mg Subcutaneous Daily  . mupirocin ointment  1 application Nasal BID   Continuous Infusions:   Procedures/Studies: Dg Chest 2 View  Result Date: 09/27/2016 CLINICAL DATA:  Acute onset of generalized chest pain and shortness of breath on exertion. Initial encounter. EXAM: CHEST  2 VIEW COMPARISON:  Chest radiograph performed 05/25/2016 FINDINGS: The lungs are well-aerated. Pulmonary vascularity is at the upper limits of normal. There is no evidence of focal opacification, pleural effusion or pneumothorax. The heart is normal in size; the mediastinal contour is within normal limits. No acute osseous abnormalities are seen. IMPRESSION: No acute cardiopulmonary process seen. Electronically Signed   By: Roanna RaiderJeffery  Chang M.D.   On: 09/27/2016 23:57    Selyna Klahn, DO  [facility] Pager [phone]  If 7PM-7AM, please contact night-coverage [email or web address] Password [email or web address] 09/28/2016, 8:07 AM   LOS: 0 days

## 2016-09-28 NOTE — H&P (Signed)
History and Physical  Patient Name: Mike Frazier [place]     ZOX:[id number]    DOB: 03/02/1981    DOA: 09/27/2016 PCP: Pcp Not In System   Patient coming from: Home     Chief Complaint: Chest pain  HPI: Mike Frazier Prats is a 35 y.o. male with a past medical history significant for asthma who presents with chest pain.  The patient was in his usual state of health until about 2 days ago when he noticed a new chest pain while climbing the stairs. The pain is located central chest, sharp in character, radiates to the left, last a few minutes at a time. Today he was at work, moving some furniture on a dolly when suddenly he had the pain and it dropped into his knees, so he came to the ER.  He had no nausea, no radiation of the neck or the arms, no sweating, no feeling of apprehension with the pain. While in the ER he feels like the pain also came and went a few times spontaneously.  ED course: -Afebrile, heart rate 60s, respirations and pulse oximetry normal, blood pressure 180/93 -Initial ECG showed no ischemic changes and troponin was 0.03 ng/mL. -Na 136, K 3.4, Cr 1.09 (baseline 1.1), WBC 7.8, Hgb 15.2 -D-dimer negative -CXR clear -His repeat troponin at 4 hours was also 0.03 ng/mL/at the lower limit of detectable, and so [facility] was asked to admit for observation, serial troponins and risk stratification -Before transfer, his ECG was reviewed with the Cardiology fellow on call who agreed that there was no STEMI  Smokes but denies cocaine use.  Extensive history of HTN on both sides of family, and he has been told he has high blood pressure several times at ER visits, but he has never seen a PCP and does not take medicine for blood pressure. He has a sister who has had 2 heart attacks he believes and is now age 37. He personally has had no previous vascular disease.   Review of Systems:  Review of Systems  Respiratory: Negative for cough and shortness of breath.   Cardiovascular: Positive for  chest pain. Negative for palpitations, orthopnea, claudication, leg swelling and PND.  Gastrointestinal: Negative for abdominal pain and nausea.  All other systems reviewed and are negative.    Past Medical History:  Diagnosis Date  . Abscess of finger   . Asthma   . Torsion, testicular     Past Surgical History:  Procedure Laterality Date  . I&D EXTREMITY  10/21/2012   Procedure: IRRIGATION AND DEBRIDEMENT EXTREMITY;  Surgeon: Marlowe ShoresMatthew A Weingold, MD;  Location: [facility] OR;  Service: Orthopedics;  Laterality: Left;  . INCISION / DRAINAGE HAND / FINGER    . TESTICLE TORSION REDUCTION      Social History: Patient lives with his mother.  Patient walks unassisted.  He works in loading trucks, full time.  He smokes intermittently and uses marijuana, but does not use cocaine, smoke cocaine and does not use alcohol to excess.   reports that he has been smoking.  He has been smoking about 0.50 packs per day. He has never used smokeless tobacco. He reports that he uses drugs, including Marijuana. He reports that he does not drink alcohol.  Allergies  Allergen Reactions  . Codeine     Family history: family history includes Heart attack in his sister; Hypertension in his maternal grandmother and mother.  Prior to Admission medications   None       Physical Exam: BP Marland Kitchen(!)  182/96 (BP Location: Left Arm)   Pulse (!) 51   Temp 98.2 F (36.8 C) (Oral)   Resp 16   Ht 5\' 9"  (1.753 m)   Wt 104.3 kg (230 lb)   SpO2 99%   BMI 33.97 kg/m  General appearance: Well-developed, adult male, alert and in no acute distress.   Eyes: Anicteric, conjunctiva pink, lids and lashes normal.     ENT: No nasal deformity, discharge, or epistaxis.  OP moist without lesions.   Skin: Warm and dry.   Cardiac: RRR, nl S1-S2, no murmurs appreciated.  Capillary refill is brisk.  JVP normal.  No LE edema.  Radial and DP pulses 2+ and symmetric.  No carotid bruits. Respiratory: Normal respiratory rate and rhythm.   CTAB without rales or wheezes. GI: Abdomen soft without rigidity.  No TTP. No ascites, distension.   MSK: No deformities or effusions.   Pain not reproduced with palpation of precordium.  No pain with arm movement. Neuro: Sensorium intact and responding to questions, attention normal.  Speech is fluent.  Moves all extremities equally and with normal coordination.    Psych: Behavior appropriate.  Affect normal.  No evidence of aural or visual hallucinations or delusions.       Labs on Admission:  The metabolic panel shows normal electrolytes and renal function, slightly elevated glucose but nondiagnostic. The complete blood count shows no leukocytosis, anemia, thrombocytopenia. The initial troponin is minimally positive 2. D-dimer negative.  Radiological Exams on Admission: Personally reviewed and shows no focal opacities, edema: Dg Chest 2 View  Result Date: 09/27/2016 CLINICAL DATA:  Acute onset of generalized chest pain and shortness of breath on exertion. Initial encounter. EXAM: CHEST  2 VIEW COMPARISON:  Chest radiograph performed 05/25/2016 FINDINGS: The lungs are well-aerated. Pulmonary vascularity is at the upper limits of normal. There is no evidence of focal opacification, pleural effusion or pneumothorax. The heart is normal in size; the mediastinal contour is within normal limits. No acute osseous abnormalities are seen. IMPRESSION: No acute cardiopulmonary process seen. Electronically Signed   By: Roanna Raider M.D.   On: 09/27/2016 23:57    EKG: Independently reviewed. Rate 68, QTC 424, early repolarization pattern in anterior leads, no ST depressions. He has a near IVCD, nonspecific pattern.    Assessment/Plan 1. Chest pain: This is new.  The patient has HEART score of 3. Angina is atypical in that it is not consistently exertional or relieved with rest, and is intense, lasting only minutes.  Other potential causes of chest pain (PE, dissection, pancreatitis,  pneumonia/effusion, pericarditis) are doubted or have been ruled out with D-dimer and CXR.  Possibly this is all hypertensive heart (pain and minimally elevated troponin).  We have been asked to admit the patient for observation and etiology consultation with Cardiology tomorrow.   -Will continue to trend enzymes this morning -Telemetry -Will obtain echocardiogram -Consult to cardiology, appreciate recommendations -Smoking cessation was recommended strongly   2. Hypertension:  This is new.   -Start amlodipine 5 mg -Check lipids        DVT prophylaxis: Lovenox Diet: NPO after 4am for anticipated stress testing Code Status: FULL  Family Communication: None present  Disposition Plan: Anticipate overnight observation for arrhythmia on telemetry, serial troponins and subsequent risk stratification by Cardiology.  If testing negative, home after. Admission status: Telemetry, OBS   Medical decision making: Patient seen at 6:16 AM on 09/28/2016.  The patient was discussed with Dr. Read Drivers. What exists of the patient's chart  was reviewed in depth.  Clinical condition: stable.      Alberteen Sam [facility] Pager [phone]

## 2016-09-28 NOTE — Progress Notes (Signed)
35 yo M without previous medical history presents with intermittent chest pain for 2 days.  Severe in intensity, lasting minutes, left sided.    BP 154/88   Pulse 71   Temp 97.8 F (36.6 C)   Resp 14   Ht 5\' 9"  (1.753 m)   Wt 104.3 kg (230 lb)   SpO2 98%   BMI 33.97 kg/m   D-dimer negative.  WBC normal.  Cr normal.  Troponin at ULN twice (0.03 ng/mL).  ECG shows minimal ST elevations in anterior leads.  ER will discuss with Cardiology and if no STEMI, will admit to OBS tele for echo and serial enzymes.

## 2016-09-28 NOTE — Consult Note (Signed)
CARDIOLOGY CONSULT NOTE   Patient ID: Mike Frazier MRN: [id number] DOB/AGE: May 13, 1981 35 y.o.  Admit date: 09/27/2016  Requesting Physician: Dr. Arbutus Leas Primary Physician:   Pcp Not In System Primary Cardiologist:  New Reason for Consultation:  Chest pain  HPI: Mike Frazier is a 35 y.o. male with a history of tobacco abuse, early family hx of CAD, untreated HTN, hyperglycemia with no formal diagnosis of DMT2 and asthma who presented to [facility] on 09/27/16 with chest pain.   He has no past medical history likely by virtue of not seeing any medical doctors for many years. He has been seen in the ER multiple times for various problems and found to be hypertensive but never followed up with a PCP or took any medications. His sister had an MI in her 30s and has had multiple stents.  He was in his usual state of health until yesterday when he developed severe chest tightness while loading furniture. It was so severe that brought him to his knees. It did eventually resolve with rest. He had associated shortness of breath and diaphoresis. No nausea or vomiting or radiation of pain. He does note that over the past couple months he's had worsening exercise tolerance and worsening dyspnea on exertion. No lower extremity edema, orthopnea or PND. No dizziness or syncope. He does smoke about a half a pack of cigarettes for over 20 years.   Past Medical History:  Diagnosis Date  . Abscess of finger   . Asthma   . Torsion, testicular      Past Surgical History:  Procedure Laterality Date  . I&D EXTREMITY  10/21/2012   Procedure: IRRIGATION AND DEBRIDEMENT EXTREMITY;  Surgeon: Marlowe Shores, MD;  Location: [facility] OR;  Service: Orthopedics;  Laterality: Left;  . INCISION / DRAINAGE HAND / FINGER    . TESTICLE TORSION REDUCTION      Allergies  Allergen Reactions  . Codeine     I have reviewed the patient's current medications . amLODipine  5 mg Oral Daily  . Chlorhexidine Gluconate  Cloth  6 each Topical Q0600  . enoxaparin (LOVENOX) injection  40 mg Subcutaneous Daily  . mupirocin ointment  1 application Nasal BID     acetaminophen, gi cocktail, nitroGLYCERIN, ondansetron (ZOFRAN) IV  Prior to Admission medications   Not on File     Social History   Social History  . Marital status: Single    Spouse name: N/A  . Number of children: N/A  . Years of education: N/A   Occupational History  . Not on file.   Social History Main Topics  . Smoking status: Current Every Day Smoker    Packs/day: 0.50  . Smokeless tobacco: Never Used  . Alcohol use No  . Drug use:     Types: Marijuana  . Sexual activity: Not on file   Other Topics Concern  . Not on file   Social History Narrative  . No narrative on file    Family Status  Relation Status  . Mother Alive  . Father Alive  . Sister   . Maternal Grandmother    Family History  Problem Relation Age of Onset  . Hypertension Mother   . Heart attack Sister     2 MI before age 31  . Hypertension Maternal Grandmother    ROS:  Full 14 point review of systems complete and found to be negative unless listed above.  Physical Exam: Blood pressure (!) 182/96, pulse Marland Kitchen)  51, temperature 98.2 F (36.8 C), temperature source Oral, resp. rate 16, height 5\' 9"  (1.753 m), weight 230 lb (104.3 kg), SpO2 99 %.  General: Well developed, well nourished, male in no acute distress Head: Eyes PERRLA, No xanthomas.   Normocephalic and atraumatic, oropharynx without edema or exudate. Dentition:  Lungs: ctab Heart: HRRR S1 S2, no rub/gallop, Heart regular rate and rhythm with S1, S2  murmur. pulses are 2+ extrem.   Neck: No carotid bruits. No lymphadenopathy.  No JVD. Abdomen: Bowel sounds present, abdomen soft and non-tender without masses or hernias noted. Msk:  No spine or cva tenderness. No weakness, no joint deformities or effusions. Extremities: No clubbing or cyanosis.  No edema.  Neuro: Alert and oriented X 3. No  focal deficits noted. Psych:  Good affect, responds appropriately Skin: No rashes or lesions noted.  Labs:   Lab Results  Component Value Date   WBC 7.8 09/27/2016   HGB 15.2 09/27/2016   HCT 45.4 09/27/2016   MCV 87.0 09/27/2016   PLT 250 09/27/2016   No results for input(s): INR in the last 72 hours.  Recent Labs Lab 09/27/16 2343  NA 136  K 3.4*  CL 102  CO2 26  BUN 12  CREATININE 1.09  CALCIUM 9.0  GLUCOSE 182*   No results found for: MG  Recent Labs  09/27/16 2343 09/28/16 0250  TROPONINI 0.03* 0.03*   No results for input(s): TROPIPOC in the last 72 hours. No results found for: PROBNP No results found for: CHOL, HDL, LDLCALC, TRIG Lab Results  Component Value Date   DDIMER <0.27 09/27/2016    Echo:  Pending  ECG:  Sinus rhythm HR 68 Nonspecific IVCD  Minimal ST elevation, anterior leads, likely normal variant No previous tracing for comparison  Radiology:  Dg Chest 2 View  Result Date: 09/27/2016 CLINICAL DATA:  Acute onset of generalized chest pain and shortness of breath on exertion. Initial encounter. EXAM: CHEST  2 VIEW COMPARISON:  Chest radiograph performed 05/25/2016 FINDINGS: The lungs are well-aerated. Pulmonary vascularity is at the upper limits of normal. There is no evidence of focal opacification, pleural effusion or pneumothorax. The heart is normal in size; the mediastinal contour is within normal limits. No acute osseous abnormalities are seen. IMPRESSION: No acute cardiopulmonary process seen. Electronically Signed   By: Roanna Raider M.D.   On: 09/27/2016 23:57    ASSESSMENT AND PLAN:    Principal Problem:   Elevated troponin Active Problems:   Chest pain, unspecified  Chest pain: Concerning for unstable angina. Troponin is essentially negative but borderline 0.032. He is currently chest pain-free. With his family history of early CAD (sister with MI in 40s with multiple stents), ongoing tobacco abuse,  untreated hypertension and  likely prediabetes or diabetes, he is at high risk for coronary disease. Will check lipids and hemoglobin A1c for further risk stratification. 2-D echo is pending. He will need a stress test versus cardiac catheterization. Dr. Mika Anastasi to make final plan.   Hyperglycemia: Will order hemoglobin A1c  Hypertension: BP has been elevated up to 182/96. Started on Norvasc 5 mg daily here  Signed: Cline Crock, PA-C 09/28/2016 8:12 AM  Pager [phone]  Co-Sign MD  Patient seen and discussed with PA Janee Morn, I agree with her documentation above. 35 yo male with multiple CAD risk factors admitted with chest pain. Very strong family history, sister with MI in her 56s with multiple stents. Severe chest tightness/pain occurred while moving furntiture, he states it  was a mild to moderate level of exertion. Repeat epsidoe while in ER.    Hgb 15.2, Plt 250, K 3.4, Cr 1.09, D-dime rneg,  Trop 0.03 x 2 CXR no acute process EKG: SR, suggests inferior infarct.   Multiple CAD risk factors,very strong family history for early CAD. EKG suggests possible old inferior infarct, no acute changes. Admitted last night, workup is ongoing. F/u echo, EKG, and troponin trends today. Decide on ischemic testing pending further data, but given his risk factors would have low threshold for cath as opposed to noninvasive testing. Will give diet today, make NPO tonight. Treat as unstable angina at this time, start heparin gtt.    Dominga FerryJ Lorne Winkels MD

## 2016-09-29 DIAGNOSIS — R072 Precordial pain: Secondary | ICD-10-CM

## 2016-09-29 DIAGNOSIS — R778 Other specified abnormalities of plasma proteins: Secondary | ICD-10-CM

## 2016-09-29 DIAGNOSIS — R079 Chest pain, unspecified: Secondary | ICD-10-CM

## 2016-09-29 DIAGNOSIS — I208 Other forms of angina pectoris: Secondary | ICD-10-CM

## 2016-09-29 DIAGNOSIS — R7989 Other specified abnormal findings of blood chemistry: Secondary | ICD-10-CM

## 2016-09-29 DIAGNOSIS — R0789 Other chest pain: Secondary | ICD-10-CM

## 2016-09-29 DIAGNOSIS — R748 Abnormal levels of other serum enzymes: Secondary | ICD-10-CM

## 2016-09-29 LAB — BASIC METABOLIC PANEL
Anion gap: 6 (ref 5–15)
BUN: 12 mg/dL (ref 6–20)
CALCIUM: 8.9 mg/dL (ref 8.9–10.3)
CO2: 28 mmol/L (ref 22–32)
CREATININE: 1.08 mg/dL (ref 0.61–1.24)
Chloride: 105 mmol/L (ref 101–111)
GFR calc non Af Amer: >60 mL/min (ref >60)
GLUCOSE: 101 mg/dL — AB (ref 65–99)
Potassium: 4 mmol/L (ref 3.5–5.1)
Sodium: 139 mmol/L (ref 135–145)

## 2016-09-29 LAB — CBC
HEMATOCRIT: 48.1 % (ref 39.0–52.0)
HEMOGLOBIN: 15.5 g/dL (ref 13.0–17.0)
MCH: 28.8 pg (ref 26.0–34.0)
MCHC: 32.2 g/dL (ref 30.0–36.0)
MCV: 89.2 fL (ref 78.0–100.0)
Platelets: 256 10*3/uL (ref 150–400)
RBC: 5.39 MIL/uL (ref 4.22–5.81)
RDW: 14 % (ref 11.5–15.5)
WBC: 7.5 10*3/uL (ref 4.0–10.5)

## 2016-09-29 LAB — HEMOGLOBIN A1C
HEMOGLOBIN A1C: 6.1 % — AB (ref 4.8–5.6)
Mean Plasma Glucose: 128 mg/dL

## 2016-09-29 LAB — MAGNESIUM: Magnesium: 2 mg/dL (ref 1.7–2.4)

## 2016-09-29 LAB — PROTIME-INR
INR: 1.08
Prothrombin Time: 14.1 seconds (ref 11.4–15.2)

## 2016-09-29 LAB — HEPARIN LEVEL (UNFRACTIONATED): Heparin Unfractionated: 0.5 IU/mL (ref 0.30–0.70)

## 2016-09-29 MED ORDER — SODIUM CHLORIDE 0.9 % WEIGHT BASED INFUSION
3.0000 mL/kg/h | INTRAVENOUS | Status: DC
  Administered 2016-09-30: 3 mL/kg/h via INTRAVENOUS

## 2016-09-29 MED ORDER — ASPIRIN 81 MG PO CHEW
81.0000 mg | CHEWABLE_TABLET | ORAL | Status: AC
  Administered 2016-09-30: 81 mg via ORAL
  Filled 2016-09-29: qty 1

## 2016-09-29 MED ORDER — ASPIRIN EC 81 MG PO TBEC
81.0000 mg | DELAYED_RELEASE_TABLET | Freq: Every day | ORAL | Status: DC
  Administered 2016-09-29 – 2016-10-02 (×3): 81 mg via ORAL
  Filled 2016-09-29 (×5): qty 1

## 2016-09-29 MED ORDER — ATORVASTATIN CALCIUM 20 MG PO TABS
20.0000 mg | ORAL_TABLET | Freq: Every day | ORAL | Status: DC
  Administered 2016-09-29 – 2016-09-30 (×2): 20 mg via ORAL
  Filled 2016-09-29 (×2): qty 1

## 2016-09-29 MED ORDER — SODIUM CHLORIDE 0.9% FLUSH: 3.0000 mL | INTRAVENOUS | Status: DC | PRN

## 2016-09-29 MED ORDER — SODIUM CHLORIDE 0.9 % IV SOLN: 250.0000 mL | INTRAVENOUS | Status: DC | PRN

## 2016-09-29 MED ORDER — SODIUM CHLORIDE 0.9 % WEIGHT BASED INFUSION
1.0000 mL/kg/h | INTRAVENOUS | Status: DC
  Administered 2016-09-30: 1 mL/kg/h via INTRAVENOUS

## 2016-09-29 MED ORDER — SODIUM CHLORIDE 0.9% FLUSH: 3.0000 mL | Freq: Two times a day (BID) | INTRAVENOUS | Status: DC

## 2016-09-29 NOTE — Progress Notes (Signed)
PROGRESS NOTE  Mike Frazier ZOX:[id number] DOB: 12/03/1981 DOA: 09/27/2016 PCP: Pcp Not In System  Brief History:  35 year old male with a history of undiagnosed hypertension presented with intermittent chest pain that began on 09/24/2016. The patient states that he has had different times of sensations from sharp pain to dull ache. He states that he has had onset of the chest discomfort with exertion and pushing furniture as well as at rest. He had some associated shortness of breath, but denied any diaphoresis, nausea, presyncopal symptoms. He denies any recent long travels or malignancy. The patient states that he had a sister who had an MI in her mid 4230s. The patient does not take any prescription medicines and has not seen a physician in many years. Initial troponins were minimally elevated at 0.03. EKG showed sinus rhythm with IVCD and some repolarization abnormalities. Cardiology was consulted to assist with management.  Assessment/Plan: Chest Pain -pt with atypical and typical components with pain on exertion -given pt's family hx of premature coronary disease, consult cardiology -troponin 0.03 x 2 -appreciate cardiology-->treat as unstable angina-->started heparin IV 09/28/16 -plans noted for cardiac cath 09/30/16 -Echo--pending  HTN -started amlodipine -if CAD on cath-->will need BB  Dyslipidemia -LDL 105 -start statin  Bradycardia -no AV block on EKG -tele  Hypokalemia -replete -check mag--2.0   Disposition Plan:   Home when cleared by cardiology Family Communication: wife updated at bedside 09/29/16  Consultants:  Cardiology  Code Status:  FULL  DVT Prophylaxis:  IV heparin   Procedures: As Listed in Progress Note Above  Antibiotics: None    Subjective: Patient had some "light" chest pain overnight with some dizziness, shortness breath, nausea, vomiting. He stated that it was substernal and fleeting lasting approximately 1  minute. He has not had any chest discomfort since then. Today he is feeling better without any symptoms. Today,Patient denies fevers, chills, headache, chest pain, dyspnea, nausea, vomiting, diarrhea, abdominal pain, dysuria, hematuria, hematochezia, and melena.   Objective: Vitals:   09/28/16 2320 09/29/16 0405 09/29/16 0838 09/29/16 1300  BP: (!) 172/97 (!) 146/91 136/81 130/84  Pulse:    (!) 59  Resp: 17 18  17   Temp: 98.1 F (36.7 C) 97.9 F (36.6 C)  98 F (36.7 C)  TempSrc: Oral Oral  Oral  SpO2: 99% 98%  98%  Weight:      Height:        Intake/Output Summary (Last 24 hours) at 09/29/16 1500 Last data filed at 09/29/16 0600  Gross per 24 hour  Intake              605 ml  Output             1000 ml  Net             -395 ml   Weight change:  Exam:   General:  Pt is alert, follows commands appropriately, not in acute distress  HEENT: No icterus, No thrush, No neck mass, [place]/AT  Cardiovascular: RRR, S1/S2, no rubs, no gallops  Respiratory: CTA bilaterally, no wheezing, no crackles, no rhonchi  Abdomen: Soft/+BS, non tender, non distended, no guarding  Extremities: No edema, No lymphangitis, No petechiae, No rashes, no synovitis   Data Reviewed: I have personally reviewed following labs and imaging studies Basic Metabolic Panel:  Recent Labs Lab 09/27/16 2343 09/29/16 0339  NA 136 139  K 3.4* 4.0  CL 102 105  CO2 26 28  GLUCOSE 182* 101*  BUN 12 12  CREATININE 1.09 1.08  CALCIUM 9.0 8.9  MG  --  2.0   Liver Function Tests: No results for input(s): AST, ALT, ALKPHOS, BILITOT, PROT, ALBUMIN in the last 168 hours. No results for input(s): LIPASE, AMYLASE in the last 168 hours. No results for input(s): AMMONIA in the last 168 hours. Coagulation Profile:  Recent Labs Lab 09/29/16 1116  INR 1.08   CBC:  Recent Labs Lab 09/27/16 2343 09/29/16 0339  WBC 7.8 7.5  NEUTROABS 3.5  --   HGB 15.2 15.5  HCT 45.4 48.1  MCV 87.0 89.2  PLT 250 256    Cardiac Enzymes:  Recent Labs Lab 09/27/16 2343 09/28/16 0250 09/28/16 0634 09/28/16 0935 09/28/16 1246  TROPONINI 0.03* 0.03* <0.03 <0.03 <0.03   BNP: Invalid input(s): POCBNP CBG: No results for input(s): GLUCAP in the last 168 hours. HbA1C:  Recent Labs  09/28/16 0935  HGBA1C 6.1*   Urine analysis: No results found for: COLORURINE, APPEARANCEUR, LABSPEC, PHURINE, GLUCOSEU, HGBUR, BILIRUBINUR, KETONESUR, PROTEINUR, UROBILINOGEN, NITRITE, LEUKOCYTESUR Sepsis Labs: @LABRCNTIP (procalcitonin:4,lacticidven:4) ) Recent Results (from the past 240 hour(s))  MRSA PCR Screening     Status: Abnormal   Collection Time: 09/28/16  5:45 AM  Result Value Ref Range Status   MRSA by PCR POSITIVE (A) NEGATIVE Final    Comment:        The GeneXpert MRSA Assay (FDA approved for NASAL specimens only), is one component of a comprehensive MRSA colonization surveillance program. It is not intended to diagnose MRSA infection nor to guide or monitor treatment for MRSA infections. RESULT CALLED TO, READ BACK BY AND VERIFIED WITH: D. DUVALL RN 8:05 09/28/16 ([email or web address])      Scheduled Meds: . amLODipine  5 mg Oral Daily  . aspirin EC  81 mg Oral Daily  . Chlorhexidine Gluconate Cloth  6 each Topical Q0600  . mupirocin ointment  1 application Nasal BID   Continuous Infusions: . heparin 1,250 Units/hr (09/28/16 2159)    Procedures/Studies: Dg Chest 2 View  Result Date: 09/27/2016 CLINICAL DATA:  Acute onset of generalized chest pain and shortness of breath on exertion. Initial encounter. EXAM: CHEST  2 VIEW COMPARISON:  Chest radiograph performed 05/25/2016 FINDINGS: The lungs are well-aerated. Pulmonary vascularity is at the upper limits of normal. There is no evidence of focal opacification, pleural effusion or pneumothorax. The heart is normal in size; the mediastinal contour is within normal limits. No acute osseous abnormalities are seen. IMPRESSION: No acute cardiopulmonary  process seen. Electronically Signed   By: Roanna Raider M.D.   On: 09/27/2016 23:57    Mayeli Bornhorst, DO  [facility] Pager [phone]  If 7PM-7AM, please contact night-coverage [email or web address] Password [email or web address] 09/29/2016, 3:00 PM   LOS: 1 day

## 2016-09-29 NOTE — Progress Notes (Signed)
ANTICOAGULATION CONSULT NOTE - Follow Up Consult  Pharmacy Consult for heparin Indication: chest pain/ACS  Allergies  Allergen Reactions  . Codeine Shortness Of Breath    Patient Measurements: Height: 5\' 9"  (175.3 cm) Weight: 230 lb (104.3 kg) IBW/kg (Calculated) : 70.7 Heparin Dosing Weight: 93 kg  Vital Signs: Temp: 97.9 F (36.6 C) (10/01 0405) Temp Source: Oral (10/01 0405) BP: 146/91 (10/01 0405) Pulse Rate: 63 (09/30 2036)  Labs:  Recent Labs  09/27/16 2343  09/28/16 [id number] 09/28/16 0935 09/28/16 1246 09/28/16 1620 09/29/16 0339  HGB 15.2  --   --   --   --   --  15.5  HCT 45.4  --   --   --   --   --  48.1  PLT 250  --   --   --   --   --  256  HEPARINUNFRC  --   --   --   --   --  0.46 0.50  CREATININE 1.09  --   --   --   --   --  1.08  TROPONINI 0.03*  < > <0.03 <0.03 <0.03  --   --   < > = values in this interval not displayed.  Estimated Creatinine Clearance: 111.4 mL/min (by C-G formula based on SCr of 1.08 mg/dL).   Medications:  Prescriptions Prior to Admission  Medication Sig Dispense Refill Last Dose  . albuterol (PROVENTIL HFA;VENTOLIN HFA) 108 (90 Base) MCG/ACT inhaler Inhale 2 puffs into the lungs every 6 (six) hours as needed for wheezing or shortness of breath (asthma).   1-2 months ago  . ibuprofen (ADVIL,MOTRIN) 200 MG tablet Take 800 mg by mouth 2 (two) times daily as needed for headache (pain).   few days ago  . [DISCONTINUED] sulfamethoxazole-trimethoprim (SEPTRA DS) 800-160 MG per tablet Take 1 tablet by mouth every 12 (twelve) hours. 10 tablet 0     Assessment: 35 yo M admitted 09/27/2016 with chest pain and no prior cardiac history. Pharmacy consulted to dose heparin for ACS. Troponins <0.03. Not on any anticoagulation PTA. Heparin level therapeutic at 0.50. CBC within normal limits. No s/sx of bleeding noted.  Goal of Therapy:  Heparin level 0.3-0.7 units/ml  Monitor platelets by anticoagulation protocol: Yes    Plan:  - Continue  heparin 1250 units/hr - Monitor daily heparin level, CBC and for s/sx of bleeding  York CeriseKatherine Cook, PharmD Pharmacy Resident  Pager [phone] 09/29/16 7:56 AM

## 2016-09-29 NOTE — Progress Notes (Signed)
Subjective:     Mild chest pain overnight now resolved.  Objective:   Temp:  [97.8 F (36.6 C)-98.2 F (36.8 C)] 97.9 F (36.6 C) (10/01 0405) Pulse Rate:  [61-63] 63 (09/30 2036) Resp:  [16-18] 18 (10/01 0405) BP: (136-172)/(81-110) 136/81 (10/01 0838) SpO2:  [98 %-99 %] 98 % (10/01 0405) Last BM Date: 09/26/16  Filed Weights   09/27/16 2320  Weight: 230 lb (104.3 kg)    Intake/Output Summary (Last 24 hours) at 09/29/16 0853 Last data filed at 09/29/16 0600  Gross per 24 hour  Intake             1025 ml  Output             1000 ml  Net               25 ml    Telemetry: NSR  Exam:  General:NAD  HEENT: sclera clear, throat clear  Resp: CTAB  Cardiac: RRR, no m/r/g, no jvd  GI: abdomen soft, NT, ND  MSK: no LE edema  Neuro: no focal deficits  Psych: appropriate affect  Lab Results:  Basic Metabolic Panel:  Recent Labs Lab 09/27/16 2343 09/29/16 0339  NA 136 139  K 3.4* 4.0  CL 102 105  CO2 26 28  GLUCOSE 182* 101*  BUN 12 12  CREATININE 1.09 1.08  CALCIUM 9.0 8.9  MG  --  2.0    Liver Function Tests: No results for input(s): AST, ALT, ALKPHOS, BILITOT, PROT, ALBUMIN in the last 168 hours.  CBC:  Recent Labs Lab 09/27/16 2343 09/29/16 0339  WBC 7.8 7.5  HGB 15.2 15.5  HCT 45.4 48.1  MCV 87.0 89.2  PLT 250 256    Cardiac Enzymes:  Recent Labs Lab 09/28/16 0634 09/28/16 0935 09/28/16 1246  TROPONINI <0.03 <0.03 <0.03    BNP: No results for input(s): PROBNP in the last 8760 hours.  Coagulation: No results for input(s): INR in the last 168 hours.  ECG:   Medications:   Scheduled Medications: . amLODipine  5 mg Oral Daily  . Chlorhexidine Gluconate Cloth  6 each Topical Q0600  . mupirocin ointment  1 application Nasal BID     Infusions: . heparin 1,250 Units/hr (09/28/16 2159)     PRN Medications:  acetaminophen, gi cocktail, nitroGLYCERIN, ondansetron (ZOFRAN) IV     Assessment/Plan    1.  Chest pain - multiple CAD risk factors including sister with CAD in her 30s with multiple stents. Admitted with exertional chest pain with mild to moderate level of activity. Repeat episode while in ER. Mild symptoms overnight.  - nonspecific trop of 0.03, now undetectable. EKG suggests possible old inferior infarct. Echo is pending - given his risk factors and family history high suspicion for unstable angina. He is on medical therapy with ASA, heparin gtt. If confirmed by ischemic testing will need added beta blocker and ACE-I. Given high suspicion for UA, plan for cath tomorrow as opposed to noninvasive testing.  - normal lipids, f/u HgbA1c.   2. HTN - improved on norvasc. Pending cardiac workup, may need addition to regimen if systolic dysfunction or CAD is present.    I have reviewed the risks, indications, and alternatives to cardiac catheterization, possible angioplasty, and stenting with the patient. Risks include but are not limited to bleeding, infection, vascular injury, stroke, myocardial infection, arrhythmia, kidney injury, radiation-related injury in the case of prolonged fluoroscopy use, emergency cardiac surgery, and death. The patient  understands the risks of serious complication is 1-2 in 1000 with diagnostic cardiac cath and 1-2% or less with angioplasty/stenting.   Dina Rich, M.D.

## 2016-09-30 ENCOUNTER — Inpatient Hospital Stay (HOSPITAL_COMMUNITY): Payer: Self-pay

## 2016-09-30 ENCOUNTER — Encounter (HOSPITAL_COMMUNITY)
Admission: EM | Disposition: A | Discharge status: Home / Self Care | Payer: Self-pay | Source: Home / Self Care | Attending: Internal Medicine

## 2016-09-30 DIAGNOSIS — I200 Unstable angina: Secondary | ICD-10-CM

## 2016-09-30 DIAGNOSIS — I214 Non-ST elevation (NSTEMI) myocardial infarction: Secondary | ICD-10-CM

## 2016-09-30 DIAGNOSIS — I10 Essential (primary) hypertension: Secondary | ICD-10-CM

## 2016-09-30 DIAGNOSIS — I25110 Atherosclerotic heart disease of native coronary artery with unstable angina pectoris: Secondary | ICD-10-CM

## 2016-09-30 DIAGNOSIS — R079 Chest pain, unspecified: Secondary | ICD-10-CM

## 2016-09-30 HISTORY — PX: CARDIAC CATHETERIZATION: SHX172

## 2016-09-30 LAB — ECHOCARDIOGRAM COMPLETE
E decel time: 180 msec
E/e' ratio: 8.42
FS: 43 % (ref 28–44)
HEIGHTINCHES: 69 in
IV/PV OW: 0.78
LA ID, A-P, ES: 35 mm
LA diam index: 1.61 cm/m2
LA vol A4C: 48 ml
LA vol index: 14.1 mL/m2
LA vol: 30.6 mL
LDCA: 3.8 cm2
LEFT ATRIUM END SYS DIAM: 35 mm
LV E/e'average: 8.42
LV TDI E'LATERAL: 12
LV TDI E'MEDIAL: 10.3
LVEEMED: 8.42
LVELAT: 12 cm/s
LVOT VTI: 22.2 cm
LVOT peak grad rest: 6 mmHg
LVOTD: 22 mm
LVOTPV: 121 cm/s
LVOTSV: 84 mL
MV Dec: 180
MV pk A vel: 61.6 m/s
MV pk E vel: 101 m/s
MVPG: 4 mmHg
PW: 12.5 mm — AB (ref 0.6–1.1)
WEIGHTICAEL: 3577.6 [oz_av]

## 2016-09-30 LAB — COMPREHENSIVE METABOLIC PANEL
ALK PHOS: 51 U/L (ref 38–126)
ALT: 16 U/L — AB (ref 17–63)
AST: 16 U/L (ref 15–41)
Albumin: 3.8 g/dL (ref 3.5–5.0)
Anion gap: 7 (ref 5–15)
BILIRUBIN TOTAL: 0.9 mg/dL (ref 0.3–1.2)
BUN: 11 mg/dL (ref 6–20)
CALCIUM: 9.1 mg/dL (ref 8.9–10.3)
CO2: 26 mmol/L (ref 22–32)
CREATININE: 1.1 mg/dL (ref 0.61–1.24)
Chloride: 105 mmol/L (ref 101–111)
Glucose, Bld: 100 mg/dL — ABNORMAL HIGH (ref 65–99)
Potassium: 3.9 mmol/L (ref 3.5–5.1)
Sodium: 138 mmol/L (ref 135–145)
Total Protein: 6.3 g/dL — ABNORMAL LOW (ref 6.5–8.1)

## 2016-09-30 LAB — PROTIME-INR
INR: 1.05
PROTHROMBIN TIME: 13.7 s (ref 11.4–15.2)

## 2016-09-30 LAB — POCT ACTIVATED CLOTTING TIME
ACTIVATED CLOTTING TIME: 301 s
Activated Clotting Time: 340 seconds
Activated Clotting Time: 406 seconds

## 2016-09-30 LAB — CBC
HEMATOCRIT: 49.2 % (ref 39.0–52.0)
HEMOGLOBIN: 15.8 g/dL (ref 13.0–17.0)
MCH: 28.9 pg (ref 26.0–34.0)
MCHC: 32.1 g/dL (ref 30.0–36.0)
MCV: 89.9 fL (ref 78.0–100.0)
Platelets: 243 10*3/uL (ref 150–400)
RBC: 5.47 MIL/uL (ref 4.22–5.81)
RDW: 13.9 % (ref 11.5–15.5)
WBC: 7.1 10*3/uL (ref 4.0–10.5)

## 2016-09-30 LAB — HEPARIN LEVEL (UNFRACTIONATED): Heparin Unfractionated: 0.43 IU/mL (ref 0.30–0.70)

## 2016-09-30 SURGERY — LEFT HEART CATH AND CORONARY ANGIOGRAPHY
Anesthesia: LOCAL

## 2016-09-30 MED ORDER — HEPARIN (PORCINE) IN NACL 2-0.9 UNIT/ML-% IJ SOLN
INTRAMUSCULAR | Status: DC | PRN
  Administered 2016-09-30: 1000 mL

## 2016-09-30 MED ORDER — ACETAMINOPHEN 325 MG PO TABS
650.0000 mg | ORAL_TABLET | ORAL | Status: DC | PRN
Start: 2016-09-30 — End: 2016-10-02
  Administered 2016-09-30: 650 mg via ORAL

## 2016-09-30 MED ORDER — ADENOSINE 12 MG/4ML IV SOLN
INTRAVENOUS | Status: AC
  Filled 2016-09-30: qty 4

## 2016-09-30 MED ORDER — FENTANYL CITRATE (PF) 100 MCG/2ML IJ SOLN
INTRAMUSCULAR | Status: DC | PRN
  Administered 2016-09-30 (×3): 50 ug via INTRAVENOUS

## 2016-09-30 MED ORDER — HEPARIN SODIUM (PORCINE) 1000 UNIT/ML IJ SOLN
INTRAMUSCULAR | Status: AC
  Filled 2016-09-30: qty 1

## 2016-09-30 MED ORDER — SODIUM CHLORIDE 0.9% FLUSH
3.0000 mL | Freq: Two times a day (BID) | INTRAVENOUS | Status: DC
  Administered 2016-09-30 – 2016-10-01 (×3): 3 mL via INTRAVENOUS

## 2016-09-30 MED ORDER — ONDANSETRON HCL 4 MG/2ML IJ SOLN
4.0000 mg | Freq: Four times a day (QID) | INTRAMUSCULAR | Status: DC | PRN
Start: 2016-09-30 — End: 2016-10-01

## 2016-09-30 MED ORDER — TICAGRELOR 90 MG PO TABS
90.0000 mg | ORAL_TABLET | Freq: Two times a day (BID) | ORAL | Status: DC
  Administered 2016-10-01 – 2016-10-02 (×3): 90 mg via ORAL
  Filled 2016-09-30 (×4): qty 1

## 2016-09-30 MED ORDER — TICAGRELOR 90 MG PO TABS
ORAL_TABLET | ORAL | Status: DC | PRN
  Administered 2016-09-30: 180 mg via ORAL

## 2016-09-30 MED ORDER — PERFLUTREN LIPID MICROSPHERE
INTRAVENOUS | Status: AC
  Administered 2016-09-30: 2 mL
  Filled 2016-09-30: qty 10

## 2016-09-30 MED ORDER — HEPARIN SODIUM (PORCINE) 1000 UNIT/ML IJ SOLN
INTRAMUSCULAR | Status: DC | PRN
  Administered 2016-09-30: 5000 [IU] via INTRAVENOUS
  Administered 2016-09-30: 4000 [IU] via INTRAVENOUS

## 2016-09-30 MED ORDER — NITROGLYCERIN 1 MG/10 ML FOR IR/CATH LAB
INTRA_ARTERIAL | Status: AC
  Filled 2016-09-30: qty 10

## 2016-09-30 MED ORDER — TICAGRELOR 90 MG PO TABS
ORAL_TABLET | ORAL | Status: AC
  Filled 2016-09-30: qty 1

## 2016-09-30 MED ORDER — TIROFIBAN HCL IN NACL 5-0.9 MG/100ML-% IV SOLN
INTRAVENOUS | Status: AC
  Filled 2016-09-30: qty 100

## 2016-09-30 MED ORDER — IOPAMIDOL (ISOVUE-370) INJECTION 76%
INTRAVENOUS | Status: AC
  Filled 2016-09-30: qty 100

## 2016-09-30 MED ORDER — TIROFIBAN HCL IN NACL 5-0.9 MG/100ML-% IV SOLN
INTRAVENOUS | Status: DC | PRN
  Administered 2016-09-30: 0.15 ug/kg/min via INTRAVENOUS

## 2016-09-30 MED ORDER — TIROFIBAN (AGGRASTAT) BOLUS VIA INFUSION
INTRAVENOUS | Status: DC | PRN
  Administered 2016-09-30: 2535 ug via INTRAVENOUS

## 2016-09-30 MED ORDER — VERAPAMIL HCL 2.5 MG/ML IV SOLN
INTRAVENOUS | Status: DC | PRN
  Administered 2016-09-30: 10 mL via INTRA_ARTERIAL

## 2016-09-30 MED ORDER — IOPAMIDOL (ISOVUE-370) INJECTION 76%
INTRAVENOUS | Status: AC
  Filled 2016-09-30: qty 50

## 2016-09-30 MED ORDER — LIDOCAINE HCL (PF) 1 % IJ SOLN
INTRAMUSCULAR | Status: DC | PRN
  Administered 2016-09-30: 2 mL

## 2016-09-30 MED ORDER — SODIUM CHLORIDE 0.9 % WEIGHT BASED INFUSION
1.0000 mL/kg/h | INTRAVENOUS | Status: AC
Start: 2016-09-30 — End: 2016-09-30
  Administered 2016-09-30: 1 mL/kg/h via INTRAVENOUS

## 2016-09-30 MED ORDER — ASPIRIN 81 MG PO CHEW: 81.0000 mg | CHEWABLE_TABLET | Freq: Every day | ORAL | Status: DC

## 2016-09-30 MED ORDER — MIDAZOLAM HCL 2 MG/2ML IJ SOLN
INTRAMUSCULAR | Status: AC
  Filled 2016-09-30: qty 2

## 2016-09-30 MED ORDER — LIDOCAINE HCL (PF) 1 % IJ SOLN
INTRAMUSCULAR | Status: AC
  Filled 2016-09-30: qty 30

## 2016-09-30 MED ORDER — OXYCODONE-ACETAMINOPHEN 5-325 MG PO TABS
1.0000 | ORAL_TABLET | ORAL | Status: DC | PRN
  Administered 2016-09-30 – 2016-10-02 (×2): 2 via ORAL
  Filled 2016-09-30 (×2): qty 2

## 2016-09-30 MED ORDER — HEPARIN SODIUM (PORCINE) 5000 UNIT/ML IJ SOLN
5000.0000 [IU] | Freq: Three times a day (TID) | INTRAMUSCULAR | Status: DC
  Administered 2016-09-30 – 2016-10-02 (×5): 5000 [IU] via SUBCUTANEOUS
  Filled 2016-09-30 (×5): qty 1

## 2016-09-30 MED ORDER — HEPARIN (PORCINE) IN NACL 2-0.9 UNIT/ML-% IJ SOLN
INTRAMUSCULAR | Status: AC
  Filled 2016-09-30: qty 1000

## 2016-09-30 MED ORDER — FENTANYL CITRATE (PF) 100 MCG/2ML IJ SOLN
INTRAMUSCULAR | Status: AC
  Filled 2016-09-30: qty 2

## 2016-09-30 MED ORDER — NITROGLYCERIN 1 MG/10 ML FOR IR/CATH LAB
INTRA_ARTERIAL | Status: DC | PRN
  Administered 2016-09-30 (×3): 200 ug via INTRACORONARY

## 2016-09-30 MED ORDER — NITROGLYCERIN IN D5W 200-5 MCG/ML-% IV SOLN
INTRAVENOUS | Status: DC | PRN
  Administered 2016-09-30: 20 ug/min via INTRAVENOUS

## 2016-09-30 MED ORDER — NITROGLYCERIN IN D5W 200-5 MCG/ML-% IV SOLN
INTRAVENOUS | Status: AC
  Filled 2016-09-30: qty 250

## 2016-09-30 MED ORDER — SODIUM CHLORIDE 0.9% FLUSH: 3.0000 mL | INTRAVENOUS | Status: DC | PRN

## 2016-09-30 MED ORDER — IOPAMIDOL (ISOVUE-370) INJECTION 76%
INTRAVENOUS | Status: DC | PRN
  Administered 2016-09-30: 275 mL via INTRA_ARTERIAL

## 2016-09-30 MED ORDER — VERAPAMIL HCL 2.5 MG/ML IV SOLN
INTRAVENOUS | Status: AC
  Filled 2016-09-30: qty 2

## 2016-09-30 MED ORDER — SODIUM CHLORIDE 0.9 % IV SOLN: 250.0000 mL | INTRAVENOUS | Status: DC | PRN

## 2016-09-30 MED ORDER — MIDAZOLAM HCL 2 MG/2ML IJ SOLN
INTRAMUSCULAR | Status: DC | PRN
  Administered 2016-09-30 (×4): 1 mg via INTRAVENOUS

## 2016-09-30 SURGICAL SUPPLY — 18 items
BALLN EMERGE MR 2.5X12 (BALLOONS) ×2
BALLN ~~LOC~~ TREK RX 3.0X12 (BALLOONS) ×2
BALLOON EMERGE MR 2.5X12 (BALLOONS) ×1 IMPLANT
BALLOON ~~LOC~~ TREK RX 3.0X12 (BALLOONS) ×1 IMPLANT
CATH IMPULSE 5F ANG/FL3.5 (CATHETERS) ×2 IMPLANT
CATH MICROCATH NAVVUS (MICROCATHETER) ×1 IMPLANT
CATH VISTA GUIDE 6FR XBLAD3.5 (CATHETERS) ×2 IMPLANT
DEVICE RAD COMP TR BAND LRG (VASCULAR PRODUCTS) ×2 IMPLANT
GLIDESHEATH SLEND A-KIT 6F 22G (SHEATH) ×2 IMPLANT
KIT ENCORE 26 ADVANTAGE (KITS) ×2 IMPLANT
KIT HEART LEFT (KITS) ×2 IMPLANT
MICROCATHETER NAVVUS (MICROCATHETER) ×2
PACK CARDIAC CATHETERIZATION (CUSTOM PROCEDURE TRAY) ×2 IMPLANT
STENT PROMUS PREM MR 2.75X16 (Permanent Stent) ×2 IMPLANT
TRANSDUCER W/STOPCOCK (MISCELLANEOUS) ×2 IMPLANT
TUBING CIL FLEX 10 FLL-RA (TUBING) ×2 IMPLANT
WIRE ASAHI PROWATER 180CM (WIRE) ×4 IMPLANT
WIRE SAFE-T 1.5MM-J .035X260CM (WIRE) ×2 IMPLANT

## 2016-09-30 NOTE — Progress Notes (Signed)
PROGRESS NOTE  Mike SickleJameel Frazier OZH:[id number]:[id number] DOB: 04/29/1981 DOA: 09/27/2016 PCP: Pcp Not In System  Brief History: 35 year old male with a history of undiagnosed hypertension presented with intermittent chest pain that began on 09/24/2016. The patient states that he has had different times of sensations from sharp pain to dull ache. He states that he has had onset of the chest discomfort with exertion and pushing furniture as well as at rest. He had some associated shortness of breath, but denied any diaphoresis, nausea, presyncopal symptoms. He denies any recent long travels or malignancy. The patient states that he had a sister who had an MI in her mid 2630s. The patient does not take any prescription medicines and has not seen a physician in many years. Initial troponins were minimally elevated at 0.03. EKG showed sinus rhythm with IVCD and some repolarization abnormalities. Cardiology was consulted to assist with management.  Assessment/Plan: Chest Pain -pt with atypical and typical components with pain on exertion -given pt's family hx of premature coronary disease, consult cardiology -troponin 0.03 x 2 -appreciate cardiology-->treat as unstable angina-->started heparin IV 09/28/16 -plans noted for cardiac cath 09/30/16 -Echo--EF 65-70%, no WMA  HTN -continue amlodipine -if CAD on cath-->will need BB  Dyslipidemia -LDL 105 -start statin if treating pt as UA  Bradycardia -no AV block on EKG -tele  Hypokalemia -replete -check mag--2.0   Disposition Plan: Home when cleared by cardiology Family Communication: wife updated at bedside 09/29/16  Consultants: Cardiology  Code Status: FULL  DVT Prophylaxis: IV heparin   Procedures: As Listed in Progress Note Above  Antibiotics: None     Subjective: Patient denies fevers, chills, headache, chest pain, dyspnea, nausea, vomiting, diarrhea, abdominal pain, dysuria, hematuria, hematochezia,  and melena.   Objective: Vitals:   09/29/16 2042 09/30/16 0500 09/30/16 1300 09/30/16 1555  BP: (!) 153/87 (!) 150/85 138/82   Pulse: 64 (!) 53 61   Resp:   15   Temp: 98.2 F (36.8 C) 98 F (36.7 C) 98.1 F (36.7 C)   TempSrc: Oral Oral Oral   SpO2: 99% 100% 100% 99%  Weight:  101.4 kg (223 lb 9.6 oz)    Height:        Intake/Output Summary (Last 24 hours) at 09/30/16 1643 Last data filed at 09/30/16 1500  Gross per 24 hour  Intake           1684.4 ml  Output              500 ml  Net           1184.4 ml   Weight change:  Exam:   General:  Pt is alert, follows commands appropriately, not in acute distress  HEENT: No icterus, No thrush, No neck mass, [place]/AT  Cardiovascular: RRR, S1/S2, no rubs, no gallops  Respiratory: CTA bilaterally, no wheezing, no crackles, no rhonchi  Abdomen: Soft/+BS, non tender, non distended, no guarding  Extremities: No edema, No lymphangitis, No petechiae, No rashes, no synovitis   Data Reviewed: I have personally reviewed following labs and imaging studies Basic Metabolic Panel:  Recent Labs Lab 09/27/16 2343 09/29/16 0339 09/30/16 0416  NA 136 139 138  K 3.4* 4.0 3.9  CL 102 105 105  CO2 26 28 26   GLUCOSE 182* 101* 100*  BUN 12 12 11   CREATININE 1.09 1.08 1.10  CALCIUM 9.0 8.9 9.1  MG  --  2.0  --    Liver Function Tests:  Recent Labs Lab 09/30/16 0416  AST 16  ALT 16*  ALKPHOS 51  BILITOT 0.9  PROT 6.3*  ALBUMIN 3.8   No results for input(s): LIPASE, AMYLASE in the last 168 hours. No results for input(s): AMMONIA in the last 168 hours. Coagulation Profile:  Recent Labs Lab 09/29/16 1116 09/30/16 0416  INR 1.08 1.05   CBC:  Recent Labs Lab 09/27/16 2343 09/29/16 0339 09/30/16 0416  WBC 7.8 7.5 7.1  NEUTROABS 3.5  --   --   HGB 15.2 15.5 15.8  HCT 45.4 48.1 49.2  MCV 87.0 89.2 89.9  PLT 250 256 243   Cardiac Enzymes:  Recent Labs Lab 09/27/16 2343 09/28/16 0250 09/28/16 0634  09/28/16 0935 09/28/16 1246  TROPONINI 0.03* 0.03* <0.03 <0.03 <0.03   BNP: Invalid input(s): POCBNP CBG: No results for input(s): GLUCAP in the last 168 hours. HbA1C:  Recent Labs  09/28/16 0935  HGBA1C 6.1*   Urine analysis: No results found for: COLORURINE, APPEARANCEUR, LABSPEC, PHURINE, GLUCOSEU, HGBUR, BILIRUBINUR, KETONESUR, PROTEINUR, UROBILINOGEN, NITRITE, LEUKOCYTESUR Sepsis Labs: @LABRCNTIP (procalcitonin:4,lacticidven:4) ) Recent Results (from the past 240 hour(s))  MRSA PCR Screening     Status: Abnormal   Collection Time: 09/28/16  5:45 AM  Result Value Ref Range Status   MRSA by PCR POSITIVE (A) NEGATIVE Final    Comment:        The GeneXpert MRSA Assay (FDA approved for NASAL specimens only), is one component of a comprehensive MRSA colonization surveillance program. It is not intended to diagnose MRSA infection nor to guide or monitor treatment for MRSA infections. RESULT CALLED TO, READ BACK BY AND VERIFIED WITH: D. DUVALL RN 8:05 09/28/16 ([email or web address])      Scheduled Meds: . [MAR Hold] amLODipine  5 mg Oral Daily  . [MAR Hold] aspirin EC  81 mg Oral Daily  . [MAR Hold] atorvastatin  20 mg Oral q1800  . [MAR Hold] Chlorhexidine Gluconate Cloth  6 each Topical Q0600  . [MAR Hold] mupirocin ointment  1 application Nasal BID  . sodium chloride flush  3 mL Intravenous Q12H   Continuous Infusions: . sodium chloride 1 mL/kg/hr (09/30/16 1509)  . heparin 1,250 Units/hr (09/29/16 2000)  . heparin      Procedures/Studies: Dg Chest 2 View  Result Date: 09/27/2016 CLINICAL DATA:  Acute onset of generalized chest pain and shortness of breath on exertion. Initial encounter. EXAM: CHEST  2 VIEW COMPARISON:  Chest radiograph performed 05/25/2016 FINDINGS: The lungs are well-aerated. Pulmonary vascularity is at the upper limits of normal. There is no evidence of focal opacification, pleural effusion or pneumothorax. The heart is normal in size; the mediastinal  contour is within normal limits. No acute osseous abnormalities are seen. IMPRESSION: No acute cardiopulmonary process seen. Electronically Signed   By: Roanna Raider M.D.   On: 09/27/2016 23:57    Yamaira Spinner, DO  [facility] Pager [phone]  If 7PM-7AM, please contact night-coverage [email or web address] Password [email or web address] 09/30/2016, 4:43 PM   LOS: 2 days

## 2016-09-30 NOTE — H&P (View-Only) (Signed)
Subjective:    Feeling well this morning.   Objective:   Temp:  [98 F (36.7 C)-98.2 F (36.8 C)] 98 F (36.7 C) (10/02 0500) Pulse Rate:  [53-64] 53 (10/02 0500) Resp:  [17] 17 (10/01 1300) BP: (130-153)/(84-87) 150/85 (10/02 0500) SpO2:  [98 %-100 %] 100 % (10/02 0500) Weight:  [223 lb 9.6 oz (101.4 kg)] 223 lb 9.6 oz (101.4 kg) (10/02 0500) Last BM Date: 09/29/16  Filed Weights   09/27/16 2320 09/30/16 0500  Weight: 230 lb (104.3 kg) 223 lb 9.6 oz (101.4 kg)    Intake/Output Summary (Last 24 hours) at 09/30/16 1117 Last data filed at 09/30/16 0900  Gross per 24 hour  Intake           809.17 ml  Output                0 ml  Net           809.17 ml    Telemetry: NSR  Exam:  General:NAD  HEENT: sclera clear, throat clear  Resp: CTAB  Cardiac: RRR, no m/r/g, no jvd  GI: abdomen soft, NT, ND  MSK: no LE edema  Neuro: no focal deficits  Psych: appropriate affect  Lab Results:  Basic Metabolic Panel:  Recent Labs Lab 09/27/16 2343 09/29/16 0339 09/30/16 0416  NA 136 139 138  K 3.4* 4.0 3.9  CL 102 105 105  CO2 26 28 26   GLUCOSE 182* 101* 100*  BUN 12 12 11   CREATININE 1.09 1.08 1.10  CALCIUM 9.0 8.9 9.1  MG  --  2.0  --     Liver Function Tests:  Recent Labs Lab 09/30/16 0416  AST 16  ALT 16*  ALKPHOS 51  BILITOT 0.9  PROT 6.3*  ALBUMIN 3.8    CBC:  Recent Labs Lab 09/27/16 2343 09/29/16 0339 09/30/16 0416  WBC 7.8 7.5 7.1  HGB 15.2 15.5 15.8  HCT 45.4 48.1 49.2  MCV 87.0 89.2 89.9  PLT 250 256 243    Cardiac Enzymes:  Recent Labs Lab 09/28/16 0634 09/28/16 0935 09/28/16 1246  TROPONINI <0.03 <0.03 <0.03    BNP: No results for input(s): PROBNP in the last 8760 hours.  Coagulation:  Recent Labs Lab 09/29/16 1116 09/30/16 0416  INR 1.08 1.05    ECG: N/A   Tele: SR   Medications:   Scheduled Medications: . amLODipine  5 mg Oral Daily  . aspirin EC  81 mg Oral Daily  . atorvastatin  20 mg  Oral q1800  . Chlorhexidine Gluconate Cloth  6 each Topical Q0600  . mupirocin ointment  1 application Nasal BID  . sodium chloride flush  3 mL Intravenous Q12H    Infusions: . sodium chloride 1 mL/kg/hr (09/30/16 0700)  . heparin 1,250 Units/hr (09/29/16 2000)    PRN Medications: sodium chloride, acetaminophen, gi cocktail, nitroGLYCERIN, ondansetron (ZOFRAN) IV, sodium chloride flush     Assessment/Plan    1. Chest pain - multiple CAD risk factors including sister with CAD in her 30s with multiple stents. Admitted with exertional chest pain with mild to moderate level of activity. - nonspecific trop of 0.03, now undetectable. EKG suggests possible old inferior infarct. Echo is pending today - given his risk factors and family history high suspicion for unstable angina. He is on medical therapy with ASA, heparin gtt.  - plan for LHC today - normal lipids, Hgb a1c 6.1  2. HTN - improved initially on Norvasc but  slightly elevated this morning. May need additional agent pending results of cath today.   Laverda Page, NP-C  Patient seen and examined and history reviewed. Agree with above findings and plan. No chest pain today. Risk factors of tobacco use, untreated HTN, and family history of premature CAD. Agree with plans for cardiac cath today. Discussed smoking cessation. Will need antihypertensive therapy.  Kadarius Cuffe [place], MDFACC 09/30/2016 11:34 AM

## 2016-09-30 NOTE — Progress Notes (Signed)
ANTICOAGULATION CONSULT NOTE - Follow Up Consult  Pharmacy Consult for heparin Indication: chest pain/ACS  Allergies  Allergen Reactions  . Codeine Shortness Of Breath    Patient Measurements: Height: 5\' 9"  (175.3 cm) Weight: 223 lb 9.6 oz (101.4 kg) IBW/kg (Calculated) : 70.7 Heparin Dosing Weight: 93 kg  Vital Signs: Temp: 98 F (36.7 C) (10/02 0500) Temp Source: Oral (10/02 0500) BP: 150/85 (10/02 0500) Pulse Rate: 53 (10/02 0500)  Labs:  Recent Labs  09/27/16 2343  09/28/16 [id number] 09/28/16 0935 09/28/16 1246 09/28/16 1620 09/29/16 0339 09/29/16 1116 09/30/16 0416  HGB 15.2  --   --   --   --   --  15.5  --  15.8  HCT 45.4  --   --   --   --   --  48.1  --  49.2  PLT 250  --   --   --   --   --  256  --  243  LABPROT  --   --   --   --   --   --   --  14.1 13.7  INR  --   --   --   --   --   --   --  1.08 1.05  HEPARINUNFRC  --   --   --   --   --  0.46 0.50  --  0.43  CREATININE 1.09  --   --   --   --   --  1.08  --  1.10  TROPONINI 0.03*  < > <0.03 <0.03 <0.03  --   --   --   --   < > = values in this interval not displayed.  Estimated Creatinine Clearance: 107.9 mL/min (by C-G formula based on SCr of 1.1 mg/dL).   Medications:  Prescriptions Prior to Admission  Medication Sig Dispense Refill Last Dose  . albuterol (PROVENTIL HFA;VENTOLIN HFA) 108 (90 Base) MCG/ACT inhaler Inhale 2 puffs into the lungs every 6 (six) hours as needed for wheezing or shortness of breath (asthma).   1-2 months ago  . ibuprofen (ADVIL,MOTRIN) 200 MG tablet Take 800 mg by mouth 2 (two) times daily as needed for headache (pain).   few days ago  . [DISCONTINUED] sulfamethoxazole-trimethoprim (SEPTRA DS) 800-160 MG per tablet Take 1 tablet by mouth every 12 (twelve) hours. 10 tablet 0     Assessment: 35 yo M admitted 09/27/2016 with chest pain and no prior cardiac history. Pharmacy consulted to dose heparin for ACS/STEMI. No oral anticoagulation PTA. Heparin level remains  therapeutic at 0.43. CBC stable and within normal limits. No s/s of bleed. Plan for cath today.   Goal of Therapy:  Heparin level 0.3-0.7 units/ml  Monitor platelets by anticoagulation protocol: Yes    Plan:  Continue heparin gtt at 1,250 units/hr Monitor daily heparin level, CBC, s/s of bleed F/U s/p cath  Enzo BiNathan Khadeejah Castner, PharmD, [facility] CypressBCPS Clinical Pharmacist Pager [phone] 09/30/2016 9:47 AM

## 2016-09-30 NOTE — Interval H&P Note (Signed)
  Anginal Classification: CCS III  Anti-ischemic medical therapy: Minimal Therapy (1 class of medications)  Non-Invasive Test Results: No non-invasive testing performed  Prior CABG: Previous CABG       ACS: Yes.    Non-ACS:    Anginal Classification: CCS III  Anti-ischemic medical therapy: Minimal Therapy (1 class of medications)  Non-Invasive Test Results: No non-invasive testing performed  Prior CABG: No previous CABG      History and Physical Interval Note:  09/30/2016 12:57 PM  Mike Frazier  has presented today for surgery, with the diagnosis of NSTEMI  The various methods of treatment have been discussed with the patient and family. After consideration of risks, benefits and other options for treatment, the patient has consented to  Procedure(s): Left Heart Cath and Coronary Angiography (N/A) as a surgical intervention .  The patient's history has been reviewed, patient examined, no change in status, stable for surgery.  I have reviewed the patient's chart and labs.  Questions were answered to the patient's satisfaction.     Lyn RecordsHenry W Smith III

## 2016-09-30 NOTE — Progress Notes (Signed)
Subjective:    Feeling well this morning.   Objective:   Temp:  [98 F (36.7 C)-98.2 F (36.8 C)] 98 F (36.7 C) (10/02 0500) Pulse Rate:  [53-64] 53 (10/02 0500) Resp:  [17] 17 (10/01 1300) BP: (130-153)/(84-87) 150/85 (10/02 0500) SpO2:  [98 %-100 %] 100 % (10/02 0500) Weight:  [223 lb 9.6 oz (101.4 kg)] 223 lb 9.6 oz (101.4 kg) (10/02 0500) Last BM Date: 09/29/16  Filed Weights   09/27/16 2320 09/30/16 0500  Weight: 230 lb (104.3 kg) 223 lb 9.6 oz (101.4 kg)    Intake/Output Summary (Last 24 hours) at 09/30/16 1117 Last data filed at 09/30/16 0900  Gross per 24 hour  Intake           809.17 ml  Output                0 ml  Net           809.17 ml    Telemetry: NSR  Exam:  General:NAD  HEENT: sclera clear, throat clear  Resp: CTAB  Cardiac: RRR, no m/r/g, no jvd  GI: abdomen soft, NT, ND  MSK: no LE edema  Neuro: no focal deficits  Psych: appropriate affect  Lab Results:  Basic Metabolic Panel:  Recent Labs Lab 09/27/16 2343 09/29/16 0339 09/30/16 0416  NA 136 139 138  K 3.4* 4.0 3.9  CL 102 105 105  CO2 26 28 26   GLUCOSE 182* 101* 100*  BUN 12 12 11   CREATININE 1.09 1.08 1.10  CALCIUM 9.0 8.9 9.1  MG  --  2.0  --     Liver Function Tests:  Recent Labs Lab 09/30/16 0416  AST 16  ALT 16*  ALKPHOS 51  BILITOT 0.9  PROT 6.3*  ALBUMIN 3.8    CBC:  Recent Labs Lab 09/27/16 2343 09/29/16 0339 09/30/16 0416  WBC 7.8 7.5 7.1  HGB 15.2 15.5 15.8  HCT 45.4 48.1 49.2  MCV 87.0 89.2 89.9  PLT 250 256 243    Cardiac Enzymes:  Recent Labs Lab 09/28/16 0634 09/28/16 0935 09/28/16 1246  TROPONINI <0.03 <0.03 <0.03    BNP: No results for input(s): PROBNP in the last 8760 hours.  Coagulation:  Recent Labs Lab 09/29/16 1116 09/30/16 0416  INR 1.08 1.05    ECG: N/A   Tele: SR   Medications:   Scheduled Medications: . amLODipine  5 mg Oral Daily  . aspirin EC  81 mg Oral Daily  . atorvastatin  20 mg  Oral q1800  . Chlorhexidine Gluconate Cloth  6 each Topical Q0600  . mupirocin ointment  1 application Nasal BID  . sodium chloride flush  3 mL Intravenous Q12H    Infusions: . sodium chloride 1 mL/kg/hr (09/30/16 0700)  . heparin 1,250 Units/hr (09/29/16 2000)    PRN Medications: sodium chloride, acetaminophen, gi cocktail, nitroGLYCERIN, ondansetron (ZOFRAN) IV, sodium chloride flush     Assessment/Plan    1. Chest pain - multiple CAD risk factors including sister with CAD in her 30s with multiple stents. Admitted with exertional chest pain with mild to moderate level of activity. - nonspecific trop of 0.03, now undetectable. EKG suggests possible old inferior infarct. Echo is pending today - given his risk factors and family history high suspicion for unstable angina. He is on medical therapy with ASA, heparin gtt.  - plan for LHC today - normal lipids, Hgb a1c 6.1  2. HTN - improved initially on Norvasc but  slightly elevated this morning. May need additional agent pending results of cath today.   Laverda Page, NP-C  Patient seen and examined and history reviewed. Agree with above findings and plan. No chest pain today. Risk factors of tobacco use, untreated HTN, and family history of premature CAD. Agree with plans for cardiac cath today. Discussed smoking cessation. Will need antihypertensive therapy.  Marcus Schwandt [place], MDFACC 09/30/2016 11:34 AM

## 2016-09-30 NOTE — Progress Notes (Signed)
  Echocardiogram 2D Echocardiogram has been performed.  Nolon RodBrown, Tony 09/30/2016, 1:58 PM

## 2016-10-01 ENCOUNTER — Encounter (HOSPITAL_COMMUNITY): Payer: Self-pay | Admitting: Interventional Cardiology

## 2016-10-01 DIAGNOSIS — E7800 Pure hypercholesterolemia, unspecified: Secondary | ICD-10-CM

## 2016-10-01 DIAGNOSIS — Z9861 Coronary angioplasty status: Secondary | ICD-10-CM

## 2016-10-01 DIAGNOSIS — I214 Non-ST elevation (NSTEMI) myocardial infarction: Principal | ICD-10-CM

## 2016-10-01 DIAGNOSIS — I200 Unstable angina: Secondary | ICD-10-CM

## 2016-10-01 DIAGNOSIS — Z720 Tobacco use: Secondary | ICD-10-CM

## 2016-10-01 DIAGNOSIS — Z955 Presence of coronary angioplasty implant and graft: Secondary | ICD-10-CM

## 2016-10-01 LAB — CBC
HEMATOCRIT: 48.8 % (ref 39.0–52.0)
Hemoglobin: 15.9 g/dL (ref 13.0–17.0)
MCH: 28.8 pg (ref 26.0–34.0)
MCHC: 32.6 g/dL (ref 30.0–36.0)
MCV: 88.4 fL (ref 78.0–100.0)
Platelets: 261 10*3/uL (ref 150–400)
RBC: 5.52 MIL/uL (ref 4.22–5.81)
RDW: 13.7 % (ref 11.5–15.5)
WBC: 10.8 10*3/uL — ABNORMAL HIGH (ref 4.0–10.5)

## 2016-10-01 LAB — BASIC METABOLIC PANEL
ANION GAP: 9 (ref 5–15)
BUN: 11 mg/dL (ref 6–20)
CALCIUM: 9.2 mg/dL (ref 8.9–10.3)
CO2: 25 mmol/L (ref 22–32)
CREATININE: 0.94 mg/dL (ref 0.61–1.24)
Chloride: 101 mmol/L (ref 101–111)
Glucose, Bld: 67 mg/dL (ref 65–99)
Potassium: 3.7 mmol/L (ref 3.5–5.1)
SODIUM: 135 mmol/L (ref 135–145)

## 2016-10-01 LAB — TROPONIN I
TROPONIN I: 2.31 ng/mL — AB (ref <0.03)
TROPONIN I: 2.98 ng/mL — AB (ref <0.03)
TROPONIN I: 3.09 ng/mL — AB (ref <0.03)

## 2016-10-01 MED ORDER — LISINOPRIL 10 MG PO TABS
10.0000 mg | ORAL_TABLET | Freq: Every day | ORAL | Status: DC
  Administered 2016-10-01 – 2016-10-02 (×2): 10 mg via ORAL
  Filled 2016-10-01 (×2): qty 1

## 2016-10-01 MED ORDER — ATORVASTATIN CALCIUM 80 MG PO TABS
80.0000 mg | ORAL_TABLET | Freq: Every day | ORAL | Status: DC
  Administered 2016-10-01: 80 mg via ORAL
  Filled 2016-10-01: qty 1

## 2016-10-01 MED ORDER — HYDRALAZINE HCL 20 MG/ML IJ SOLN
10.0000 mg | INTRAMUSCULAR | Status: DC | PRN
  Administered 2016-10-01: 10 mg via INTRAVENOUS
  Filled 2016-10-01: qty 1

## 2016-10-01 MED ORDER — METOPROLOL TARTRATE 25 MG PO TABS: 25.0000 mg | ORAL_TABLET | Freq: Two times a day (BID) | ORAL | Status: DC

## 2016-10-01 MED ORDER — TICAGRELOR 90 MG PO TABS
ORAL_TABLET | ORAL | Status: DC | PRN
  Administered 2016-09-30: 180 mg via ORAL

## 2016-10-01 MED ORDER — CARVEDILOL 6.25 MG PO TABS
6.2500 mg | ORAL_TABLET | Freq: Two times a day (BID) | ORAL | Status: DC
  Administered 2016-10-01 – 2016-10-02 (×3): 6.25 mg via ORAL
  Filled 2016-10-01 (×3): qty 1

## 2016-10-01 NOTE — Progress Notes (Signed)
Subjective:    Feels minor chest pain today. Much better than yesterday.  Objective:   Temp:  [97.9 F (36.6 C)-98.2 F (36.8 C)] 97.9 F (36.6 C) (10/03 0300) Pulse Rate:  [0-92] 75 (10/03 0700) Resp:  [0-26] 14 (10/03 0700) BP: (123-213)/(75-139) 183/106 (10/03 0700) SpO2:  [0 %-100 %] 100 % (10/03 0700) Last BM Date:  (PTA)  Filed Weights   09/27/16 2320 09/30/16 0500  Weight: 230 lb (104.3 kg) 223 lb 9.6 oz (101.4 kg)    Intake/Output Summary (Last 24 hours) at 10/01/16 0807 Last data filed at 10/01/16 0400  Gross per 24 hour  Intake          1570.67 ml  Output             1650 ml  Net           -79.33 ml    Telemetry: NSR with rare PVC. 5 beat run AIVR.  Exam:  General:NAD  HEENT: sclera clear, throat clear  Resp: CTAB  Cardiac: RRR, no m/r/g, no jvd  GI: abdomen soft, NT, ND  MSK: no LE edema, right radial site without hematoma.  Neuro: no focal deficits  Psych: appropriate affect  Lab Results:  Basic Metabolic Panel:  Recent Labs Lab 09/29/16 0339 09/30/16 0416 10/01/16 0150  NA 139 138 135  K 4.0 3.9 3.7  CL 105 105 101  CO2 28 26 25   GLUCOSE 101* 100* 67  BUN 12 11 11   CREATININE 1.08 1.10 0.94  CALCIUM 8.9 9.1 9.2  MG 2.0  --   --     Liver Function Tests:  Recent Labs Lab 09/30/16 0416  AST 16  ALT 16*  ALKPHOS 51  BILITOT 0.9  PROT 6.3*  ALBUMIN 3.8    CBC:  Recent Labs Lab 09/29/16 0339 09/30/16 0416 10/01/16 0150  WBC 7.5 7.1 10.8*  HGB 15.5 15.8 15.9  HCT 48.1 49.2 48.8  MCV 89.2 89.9 88.4  PLT 256 243 261    Cardiac Enzymes:  Recent Labs Lab 09/28/16 0634 09/28/16 0935 09/28/16 1246  TROPONINI <0.03 <0.03 <0.03    BNP: No results for input(s): PROBNP in the last 8760 hours.  Coagulation:  Recent Labs Lab 09/29/16 1116 09/30/16 0416  INR 1.08 1.05    ECG: NSR, LAD, inferior infarct. No acute change  Echo: Study Conclusions  - Left ventricle: The cavity size was normal.  Posterior wall   thickness was increased in a pattern of mild LVH. Systolic   function was vigorous. The estimated ejection fraction was in the   range of 65% to 70%. Wall motion was normal; there were no   regional wall motion abnormalities. Left ventricular diastolic   function parameters were normal. - Aortic valve: Transvalvular velocity was within the normal range.   There was no stenosis. There was no regurgitation. - Mitral valve: Transvalvular velocity was within the normal range.   There was no evidence for stenosis. There was no regurgitation. - Right ventricle: The cavity size was normal. Wall thickness was   normal. Systolic function was normal. - Tricuspid valve: There was no regurgitation.  Cardiac cath: Conclusion     A stent was not successfully placed.  Lat Ramus lesion, 100 %stenosed.  Post intervention, there is a 99% residual stenosis.  The left ventricular ejection fraction is 50-55% by visual estimate.  The left ventricular systolic function is normal.  LV end diastolic pressure is mildly elevated.  Mid LAD  lesion, 75 %stenosed.  Post intervention, there is a 0% residual stenosis.  A STENT PROMUS PREM MR 2.75X16 drug eluting stent was successfully placed.    70-85% stenosis in the mid LAD with resting FFR of 0.74.  Successful drug-eluting stent implantation reducing the 85% stenosis to 0% with TIMI grade 3 flow.  Catheter-based embolic occlusion (thrombus) of a branch in the distal ramus intermedius. Once identified, the lesion was treated with wire probing which allowed TIMI grade 1-2 flow to occur. The vessel segment was too small to dilate with the balloon.  Normal right coronary and circumflex.  Normal left ventricular systolic function. EF greater than 50%.  RECOMMENDATIONS:   Aspirin and Brilinta  Angiomax 6 hours  Subcutaneous heparin for DVT prophylaxis  Hospitalization for at least an additional 24-36 hours.  Trend cardiac  markers  Phase I into cardiac rehabilitation     Medications:   Scheduled Medications: . amLODipine  5 mg Oral Daily  . aspirin  81 mg Oral Daily  . aspirin EC  81 mg Oral Daily  . atorvastatin  80 mg Oral q1800  . Chlorhexidine Gluconate Cloth  6 each Topical Q0600  . heparin  5,000 Units Subcutaneous Q8H  . metoprolol tartrate  25 mg Oral BID  . mupirocin ointment  1 application Nasal BID  . sodium chloride flush  3 mL Intravenous Q12H  . ticagrelor  90 mg Oral BID    Infusions:    PRN Medications: sodium chloride, acetaminophen, acetaminophen, gi cocktail, nitroGLYCERIN, ondansetron (ZOFRAN) IV, ondansetron (ZOFRAN) IV, oxyCODONE-acetaminophen, sodium chloride flush     Assessment/Plan    1. USAP - multiple CAD risk factors including sister with CAD in her 30s with multiple stents. Admitted with exertional chest pain with mild to moderate level of activity. - nonspecific trop of 0.03 on admission. EKG suggests possible old inferior infarct.  - cardiac cath demonstrated significant stenosis in the LAD with abnormal FFR. This was successfully stented with DES. Complicated by catheter related embolus into distal Ramus branch. Ecg is stable. Cycle troponin today. Continue DAPT for one year. Statin Rx. Start Coreg today.   2. HTN - improved initially on Norvasc but is elevated this morning. Will add Coreg and ACEi.   3. Hypercholesterolemia. On high dose statin.  4. Tobacco abuse- recommend smoking cessation.  Will transfer to telemetry today and ambulate with cardiac Rehab. Anticipate DC tomorrow.  Peter [place], MDFACC 10/01/2016 8:07 AM

## 2016-10-01 NOTE — Progress Notes (Signed)
[facility]  Patient alert and oriented, VSS, no complaints of pain.  Patient oriented to room and updated on plan of care.

## 2016-10-01 NOTE — Progress Notes (Signed)
CRITICAL VALUE ALERT  Critical value received:  Trop 2.31  Date of notification:  10/01/2016  Time of notification:  1031  Critical value read back:Yes.    Nurse who received alert:  Carmie Lanpher, Casilda CarlsHolly Cartner  Dr [place] notified. Pt post cath with stent placement. Will continue to monitor closely.

## 2016-10-01 NOTE — Progress Notes (Signed)
CARDIAC REHAB PHASE I   PRE:  Rate/Rhythm: 89 SR  BP:  Sitting: 157/95       SaO2:   MODE:  Ambulation: 390 ft   POST:  Rate/Rhythm: 112 ST  BP:  Sitting: 170/101        SaO2:   Pt stated he has not ambulated since Friday and c/o of feeling dizzy when he stood up.  Pt walked 390 ft and tolerated well.  Reviewed MI and stent ed with pt and spouse.  Discussed the importance of taking Brilinta/ASA and NTG(as needed).  Discussed pre-diabetes and the importance of diet and exercise.  Also, discussed smoking cessation.  Pt and spouse both verbalized understanding.  Returned to chair with feet elevated after walking.  Pt c/o feeling dizzy again so he went to bed, possibly from new bp meds.   1030-11:40  Ples Spectershley L Damoni Causby, MS 10/01/2016 11:42 AM

## 2016-10-01 NOTE — Progress Notes (Signed)
PROGRESS NOTE  Mike Frazier NWG:[id number] DOB: 1981/06/11 DOA: 09/27/2016 PCP: Pcp Not In System  Brief History: 35 year old male with a history of undiagnosed hypertension presented with intermittent chest pain that began on 09/24/2016. The patient states that he has had different times of sensations from sharp pain to dull ache. He states that he has had onset of the chest discomfort with exertion and pushing furniture as well as at rest. He had some associated shortness of breath, but denied any diaphoresis, nausea, presyncopal symptoms. He denies any recent long travels or malignancy. The patient states that he had a sister who had an MI in her mid 41s. The patient does not take any prescription medicines and has not seen a physician in many years. Initial troponins were minimally elevated at 0.03. EKG showed sinus rhythm with IVCD and some repolarization abnormalities. Cardiology was consulted to assist with management. Cardiology felt that the patient was having symptoms concerning for unstable angina.  The patient underwent cardiac catheterization on 09/30/2016 which showed Mid LAD lesion, 75 %stenosed and Lat Ramus lesion, 100 %stenosed with DES placed to LAD.  Assessment/Plan: Unstable Angina -pt presented with atypical and typical components with pain on exertion -given pt's family hx of premature coronary disease, consult cardiology -troponin 0.03 x 2 -appreciate cardiology-->treated as unstable angina-->started heparin IV 09/28/16 until cath -09/30/16--cardiac cath -- Mid LAD lesion, 75 %stenosed and Lat Ramus lesion, 100 %stenosed with DES placed to LAD. -Echo--EF 65-70%, no WMA -ASA and Brillinta per cardiology  HTN -continue amlodipine -added coreg  Dyslipidemia -LDL 105 -increase statin dose to 80 mg daily  Bradycardia -no AV block on EKG -tele  Hypokalemia -replete -check mag--2.0   Disposition Plan: Home 10/02/16 if cleared by  cardiology Family Communication: wife updated at bedside 10/01/16  Consultants: Cardiology  Code Status: FULL  DVT Prophylaxis: [place] heparin   Procedures: As Listed in Progress Note Above  Antibiotics: None    Subjective: Patient has some chest discomfort earlier this morning. No further discomfort since then. Denies any fevers, chills, shortness of breath, nausea, vomiting, diarrhea, dysuria, hematuria. He has some dizziness with positional changes.  Objective: Vitals:   10/01/16 0700 10/01/16 0800 10/01/16 0941 10/01/16 1142  BP: (!) 183/106 138/85 (!) 162/95 (!) 147/87  Pulse: 75 79 84   Resp: 14 12    Temp:  97.5 F (36.4 C)  97.3 F (36.3 C)  TempSrc:  Oral  Oral  SpO2: 100% 100%  99%  Weight:      Height:        Intake/Output Summary (Last 24 hours) at 10/01/16 1621 Last data filed at 10/01/16 1400  Gross per 24 hour  Intake           941.44 ml  Output             1475 ml  Net          -533.56 ml   Weight change:  Exam:   General:  Pt is alert, follows commands appropriately, not in acute distress  HEENT: No icterus, No thrush, No neck mass, [place]/AT  Cardiovascular: RRR, S1/S2, no rubs, no gallops  Respiratory: CTA bilaterally, no wheezing, no crackles, no rhonchi  Abdomen: Soft/+BS, non tender, non distended, no guarding  Extremities: No edema, No lymphangitis, No petechiae, No rashes, no synovitis   Data Reviewed: I have personally reviewed following labs and imaging studies Basic Metabolic Panel:  Recent Labs Lab 09/27/16 2343  09/29/16 0339 09/30/16 0416 10/01/16 0150  NA 136 139 138 135  K 3.4* 4.0 3.9 3.7  CL 102 105 105 101  CO2 26 28 26 25   GLUCOSE 182* 101* 100* 67  BUN 12 12 11 11   CREATININE 1.09 1.08 1.10 0.94  CALCIUM 9.0 8.9 9.1 9.2  MG  --  2.0  --   --    Liver Function Tests:  Recent Labs Lab 09/30/16 0416  AST 16  ALT 16*  ALKPHOS 51  BILITOT 0.9  PROT 6.3*  ALBUMIN 3.8   No results for input(s):  LIPASE, AMYLASE in the last 168 hours. No results for input(s): AMMONIA in the last 168 hours. Coagulation Profile:  Recent Labs Lab 09/29/16 1116 09/30/16 0416  INR 1.08 1.05   CBC:  Recent Labs Lab 09/27/16 2343 09/29/16 0339 09/30/16 0416 10/01/16 0150  WBC 7.8 7.5 7.1 10.8*  NEUTROABS 3.5  --   --   --   HGB 15.2 15.5 15.8 15.9  HCT 45.4 48.1 49.2 48.8  MCV 87.0 89.2 89.9 88.4  PLT 250 256 243 261   Cardiac Enzymes:  Recent Labs Lab 09/28/16 0250 09/28/16 0634 09/28/16 0935 09/28/16 1246 10/01/16 0910  TROPONINI 0.03* <0.03 <0.03 <0.03 2.31*   BNP: Invalid input(s): POCBNP CBG: No results for input(s): GLUCAP in the last 168 hours. HbA1C: No results for input(s): HGBA1C in the last 72 hours. Urine analysis: No results found for: COLORURINE, APPEARANCEUR, LABSPEC, PHURINE, GLUCOSEU, HGBUR, BILIRUBINUR, KETONESUR, PROTEINUR, UROBILINOGEN, NITRITE, LEUKOCYTESUR Sepsis Labs: @LABRCNTIP (procalcitonin:4,lacticidven:4) ) Recent Results (from the past 240 hour(s))  MRSA PCR Screening     Status: Abnormal   Collection Time: 09/28/16  5:45 AM  Result Value Ref Range Status   MRSA by PCR POSITIVE (A) NEGATIVE Final    Comment:        The GeneXpert MRSA Assay (FDA approved for NASAL specimens only), is one component of a comprehensive MRSA colonization surveillance program. It is not intended to diagnose MRSA infection nor to guide or monitor treatment for MRSA infections. RESULT CALLED TO, READ BACK BY AND VERIFIED WITH: D. DUVALL RN 8:05 09/28/16 ([email or web address])      Scheduled Meds: . amLODipine  5 mg Oral Daily  . aspirin EC  81 mg Oral Daily  . atorvastatin  80 mg Oral q1800  . carvedilol  6.25 mg Oral BID WC  . Chlorhexidine Gluconate Cloth  6 each Topical Q0600  . heparin  5,000 Units Subcutaneous Q8H  . lisinopril  10 mg Oral Daily  . mupirocin ointment  1 application Nasal BID  . sodium chloride flush  3 mL Intravenous Q12H  . ticagrelor  90 mg  Oral BID   Continuous Infusions:   Procedures/Studies: Dg Chest 2 View  Result Date: 09/27/2016 CLINICAL DATA:  Acute onset of generalized chest pain and shortness of breath on exertion. Initial encounter. EXAM: CHEST  2 VIEW COMPARISON:  Chest radiograph performed 05/25/2016 FINDINGS: The lungs are well-aerated. Pulmonary vascularity is at the upper limits of normal. There is no evidence of focal opacification, pleural effusion or pneumothorax. The heart is normal in size; the mediastinal contour is within normal limits. No acute osseous abnormalities are seen. IMPRESSION: No acute cardiopulmonary process seen. Electronically Signed   By: Roanna RaiderJeffery  Chang M.D.   On: 09/27/2016 23:57    Imaya Duffy, DO  [facility] Pager [phone]  If 7PM-7AM, please contact night-coverage [email or web address] Password [email or web address] 10/01/2016, 4:21 PM   LOS: 3 days

## 2016-10-01 NOTE — Care Management Note (Signed)
Case Management Note  Patient Details  Name: Mike SickleJameel Frazier MRN: [id number] Date of Birth: 11/07/1981  Subjective/Objective:          Adm w mi          Action/Plan: lives at home. No ins. No pcp.   Expected Discharge Date:                  Expected Discharge Plan:  Home/Self Care  In-House Referral:     Discharge planning Services  CM Consult, Indigent Health Clinic, Medication Assistance  Post Acute Care Choice:    Choice offered to:     DME Arranged:    DME Agency:     HH Arranged:    HH Agency:     Status of Service:  In process, will continue to follow  If discussed at Long Length of Stay Meetings, dates discussed:    Additional Comments: gave pt 30day free brilinta card and enc pt to call 800 number to get extra meds. Pt assist form on shadow chart for md to sign. No ins. Gave pt [facility] list. Pt lives in [place].  Hanley Haysowell, Cadden Elizondo T, RN 10/01/2016, 9:53 AM

## 2016-10-01 NOTE — Progress Notes (Addendum)
[phone]: Cards Fellow pager off. Paged PA on call regarding elevated blood pressure and no prn medication.  [phone]: PA gave prn order of 10mg  hydralazine q4 hours. Will administer medication.

## 2016-10-01 NOTE — Progress Notes (Signed)
   Mild chest soreness.  Procedure yesterday complicated by embolus to a distal branch of the ramus intermedius. Managed with intracoronary pharmacology and guidewire probing.  Successful stent of mid LAD high-grade stenosis documented by FFR at rest of 0.72.  Stable this morning. Cycle cardiac markers. Ambulate. Start beta blocker. Intensify statin therapy. Discontinue smoking.

## 2016-10-02 ENCOUNTER — Telehealth: Payer: Self-pay | Admitting: Cardiology

## 2016-10-02 DIAGNOSIS — Z955 Presence of coronary angioplasty implant and graft: Secondary | ICD-10-CM

## 2016-10-02 DIAGNOSIS — R072 Precordial pain: Secondary | ICD-10-CM

## 2016-10-02 LAB — CBC
HEMATOCRIT: 50.5 % (ref 39.0–52.0)
Hemoglobin: 16.5 g/dL (ref 13.0–17.0)
MCH: 29 pg (ref 26.0–34.0)
MCHC: 32.7 g/dL (ref 30.0–36.0)
MCV: 88.8 fL (ref 78.0–100.0)
PLATELETS: 287 10*3/uL (ref 150–400)
RBC: 5.69 MIL/uL (ref 4.22–5.81)
RDW: 14 % (ref 11.5–15.5)
WBC: 10 10*3/uL (ref 4.0–10.5)

## 2016-10-02 MED ORDER — TICAGRELOR 90 MG PO TABS: 90.0000 mg | ORAL_TABLET | Freq: Two times a day (BID) | ORAL | 0 refills | Status: DC

## 2016-10-02 MED ORDER — AMLODIPINE BESYLATE 5 MG PO TABS: 5.0000 mg | ORAL_TABLET | Freq: Every day | ORAL | 0 refills | Status: DC

## 2016-10-02 MED ORDER — LISINOPRIL 10 MG PO TABS: 10.0000 mg | ORAL_TABLET | Freq: Every day | ORAL | 0 refills | Status: DC

## 2016-10-02 MED ORDER — CARVEDILOL 6.25 MG PO TABS: 6.2500 mg | ORAL_TABLET | Freq: Two times a day (BID) | ORAL | 0 refills | Status: DC

## 2016-10-02 MED ORDER — NITROGLYCERIN 0.4 MG SL SUBL: 0.4000 mg | SUBLINGUAL_TABLET | SUBLINGUAL | 0 refills | Status: DC | PRN

## 2016-10-02 MED ORDER — ASPIRIN 81 MG PO TBEC: 81.0000 mg | DELAYED_RELEASE_TABLET | Freq: Every day | ORAL | 0 refills | Status: DC

## 2016-10-02 MED ORDER — ATORVASTATIN CALCIUM 80 MG PO TABS: 80.0000 mg | ORAL_TABLET | Freq: Every day | ORAL | 0 refills | Status: DC

## 2016-10-02 NOTE — Progress Notes (Signed)
Pt has orders to be discharged. Discharge instructions given and pt has no additional questions at this time. Medication regimen reviewed and pt educated. Pt verbalized understanding and has no additional questions. Telemetry box removed. IVs removed and  Both sites in good condition. Pt stable and waiting for transportation.  Wynona NeatJessica Konstantinos Cordoba RN

## 2016-10-02 NOTE — Progress Notes (Signed)
Subjective:    Feeling well this morning. No further episodes of chest pain.   Objective:   Temp:  [97.3 F (36.3 C)-98.7 F (37.1 C)] 98.1 F (36.7 C) (10/04 0537) Pulse Rate:  [76-93] 76 (10/04 0537) Resp:  [16-18] 18 (10/04 0537) BP: (118-162)/(68-95) 118/82 (10/04 0537) SpO2:  [98 %-100 %] 98 % (10/04 0537) Weight:  [217 lb 14.4 oz (98.8 kg)-219 lb 3.2 oz (99.4 kg)] 217 lb 14.4 oz (98.8 kg) (10/04 0537) Last BM Date: 10/01/16  Filed Weights   09/30/16 0500 10/01/16 1847 10/02/16 0537  Weight: 223 lb 9.6 oz (101.4 kg) 219 lb 3.2 oz (99.4 kg) 217 lb 14.4 oz (98.8 kg)    Intake/Output Summary (Last 24 hours) at 10/02/16 0817 Last data filed at 10/02/16 0600  Gross per 24 hour  Intake              486 ml  Output             1200 ml  Net             -714 ml    Telemetry: NSR with rare PVC. 3 beat NSVT.   Exam:  General:NAD  HEENT: sclera clear, throat clear  Resp: CTAB  Cardiac: RRR, no m/r/g, no jvd  GI: abdomen soft, NT, ND  MSK: no LE edema, right radial site without hematoma.  Neuro: no focal deficits  Psych: appropriate affect  Lab Results:  Basic Metabolic Panel:  Recent Labs Lab 09/29/16 0339 09/30/16 0416 10/01/16 0150  NA 139 138 135  K 4.0 3.9 3.7  CL 105 105 101  CO2 28 26 25   GLUCOSE 101* 100* 67  BUN 12 11 11   CREATININE 1.08 1.10 0.94  CALCIUM 8.9 9.1 9.2  MG 2.0  --   --     Liver Function Tests:  Recent Labs Lab 09/30/16 0416  AST 16  ALT 16*  ALKPHOS 51  BILITOT 0.9  PROT 6.3*  ALBUMIN 3.8    CBC:  Recent Labs Lab 09/30/16 0416 10/01/16 0150 10/02/16 0317  WBC 7.1 10.8* 10.0  HGB 15.8 15.9 16.5  HCT 49.2 48.8 50.5  MCV 89.9 88.4 88.8  PLT 243 261 287    Cardiac Enzymes:  Recent Labs Lab 10/01/16 0910 10/01/16 1500 10/01/16 1944  TROPONINI 2.31* 2.98* 3.09*    BNP: No results for input(s): PROBNP in the last 8760 hours.  Coagulation:  Recent Labs Lab 09/29/16 1116 09/30/16 0416    INR 1.08 1.05    ECG: NSR, LAD, inferior infarct. No acute change  Echo: Study Conclusions  - Left ventricle: The cavity size was normal. Posterior wall   thickness was increased in a pattern of mild LVH. Systolic   function was vigorous. The estimated ejection fraction was in the   range of 65% to 70%. Wall motion was normal; there were no   regional wall motion abnormalities. Left ventricular diastolic   function parameters were normal. - Aortic valve: Transvalvular velocity was within the normal range.   There was no stenosis. There was no regurgitation. - Mitral valve: Transvalvular velocity was within the normal range.   There was no evidence for stenosis. There was no regurgitation. - Right ventricle: The cavity size was normal. Wall thickness was   normal. Systolic function was normal. - Tricuspid valve: There was no regurgitation.  Cardiac cath: Conclusion     A stent was not successfully placed.  Lat Ramus lesion, 100 %stenosed.  Post intervention, there is a 99% residual stenosis.  The left ventricular ejection fraction is 50-55% by visual estimate.  The left ventricular systolic function is normal.  LV end diastolic pressure is mildly elevated.  Mid LAD lesion, 75 %stenosed.  Post intervention, there is a 0% residual stenosis.  A STENT PROMUS PREM MR 2.75X16 drug eluting stent was successfully placed.    70-85% stenosis in the mid LAD with resting FFR of 0.74.  Successful drug-eluting stent implantation reducing the 85% stenosis to 0% with TIMI grade 3 flow.  Catheter-based embolic occlusion (thrombus) of a branch in the distal ramus intermedius. Once identified, the lesion was treated with wire probing which allowed TIMI grade 1-2 flow to occur. The vessel segment was too small to dilate with the balloon.  Normal right coronary and circumflex.  Normal left ventricular systolic function. EF greater than 50%.  RECOMMENDATIONS:   Aspirin and  Brilinta  Angiomax 6 hours  Subcutaneous heparin for DVT prophylaxis  Hospitalization for at least an additional 24-36 hours.  Trend cardiac markers  Phase I into cardiac rehabilitation     Medications:   Scheduled Medications: . amLODipine  5 mg Oral Daily  . aspirin EC  81 mg Oral Daily  . atorvastatin  80 mg Oral q1800  . carvedilol  6.25 mg Oral BID WC  . heparin  5,000 Units Subcutaneous Q8H  . lisinopril  10 mg Oral Daily  . mupirocin ointment  1 application Nasal BID  . sodium chloride flush  3 mL Intravenous Q12H  . ticagrelor  90 mg Oral BID    Infusions:    PRN Medications: sodium chloride, acetaminophen, gi cocktail, nitroGLYCERIN, oxyCODONE-acetaminophen, sodium chloride flush     Assessment/Plan    1. USAP - multiple CAD risk factors including sister with CAD in her 30s with multiple stents. Admitted with exertional chest pain with mild to moderate level of activity. - nonspecific trop of 0.03 on admission. EKG suggests possible old inferior infarct.  - cardiac cath demonstrated significant stenosis in the LAD with abnormal FFR. This was successfully stented with DES. Complicated by catheter related embolus into distal Ramus branch. Ecg is stable. Troponin cycled yesterday and noted to peak at 3.09, but reports no further chest pain.  Continue DAPT for one year. Statin Rx. Started Coreg yesterday with BP and HR improved. Walked with cardiac rehab yesterday and felt well.  - will arrange for follow up our office  2. HTN --continue on Norvasc, Coreg and ACEi.   3. Hypercholesterolemia. On high dose statin.  4. Tobacco abuse- recommend smoking cessation.   Laverda Page, NP-C 10/02/2016 8:17 AM  Patient seen and examined and history reviewed. Agree with above findings and plan. Doing very well. No recurrent chest pain. Ambulating in halls. BP controlled. Troponin peaked at 3 post PCI due to distal embolization. Stable for DC home today. Will arrange  follow up in our office. Stressed smoking cessation and lifestyle modification.  Georgianne Gritz [place], MDFACC 10/02/2016 10:14 AM

## 2016-10-02 NOTE — Discharge Summary (Addendum)
Physician Discharge Summary  Mike Frazier:[id number] DOB: 07/19/1981 DOA: 09/27/2016  PCP: Pcp Not In System  Admit date: 09/27/2016 Discharge date: 10/02/2016  Time spent: 45 minutes  Recommendations for Outpatient Follow-up:  Patient will be discharged to home.  Patient will need to follow up with primary care provider within one week of discharge.  Follow up with cardiology.  Patient should continue medications as prescribed.  Patient should follow a heart healthy diet.   Discharge Diagnoses:  Unstable angina Essential hypertension Dyslipidemia Bradycardia Hypokalemia Tobacco abuse  Discharge Condition: Stable  Diet recommendation: Heart healthy  Filed Weights   09/30/16 0500 10/01/16 1847 10/02/16 0537  Weight: 101.4 kg (223 lb 9.6 oz) 99.4 kg (219 lb 3.2 oz) 98.8 kg (217 lb 14.4 oz)    History of present illness:  On 09/28/2016 by Dr. Joen Laura Advaith Frazier is a 35 y.o. male with a past medical history significant for asthma who presents with chest pain.  The patient was in his usual state of health until about 2 days ago when he noticed a new chest pain while climbing the stairs. The pain is located central chest, sharp in character, radiates to the left, last a few minutes at a time. Today he was at work, moving some furniture on a dolly when suddenly he had the pain and it dropped into his knees, so he came to the ER.  He had no nausea, no radiation of the neck or the arms, no sweating, no feeling of apprehension with the pain. While in the ER he feels like the pain also came and went a few times spontaneously.  Hospital Course:  Unstable Angina -patient presented with atypical and typical components with pain on exertion -given family history of premature coronary disease, cardiology consulted and appreciated -appreciate cardiology-->treated as unstable angina-->started heparin IV 09/28/16 until cath -09/30/16--cardiac cath -- Mid LAD lesion, 75  %stenosed and Lat Ramus lesion, 100 %stenosed with DES placed to LAD. -Troponin trended upward to 3.09 (likely due to the above) -Echo--EF 65-70%, no WMA -ASA and Brillinta per cardiology -Follow up with cardiology as an outpatient, to be arranged by cardiology  Essential Hypertension -continueamlodipine, coreg, lisinopril  Dyslipidemia -LDL 105 -increase statin dose to 80 mg daily  Bradycardia -no AV block on EKG  Hypokalemia -repleted -Magnesium 2  Tobacco abuse -Smoking cessation discussed  Procedures: Cardiac cath: Mid LAD lesion, 75 %stenosed and Lat Ramus lesion, 100 %stenosed with DES placed to LAD  Consultations: Cardiology  Discharge Exam: Vitals:   10/01/16 1946 10/02/16 0537  BP: (!) 144/68 118/82  Pulse: 78 76  Resp: 18 18  Temp: 98.7 F (37.1 C) 98.1 F (36.7 C)     General: Well developed, well nourished, NAD, appears stated age  HEENT: NCAT, mucous membranes moist.  Neck: Supple, no JVD, no masses  Cardiovascular: S1 S2 auscultated, no rubs, murmurs or gallops. Regular rate and rhythm.  Respiratory: Clear to auscultation bilaterally with equal chest rise  Abdomen: Soft, nontender, nondistended, + bowel sounds  Extremities: warm dry without cyanosis clubbing or edema  Neuro: AAOx3, nonfocal  Skin: Without rashes exudates or nodules  Psych: Normal affect and demeanor with intact judgement and insight  Discharge Instructions Discharge Instructions    Amb Referral to Cardiac Rehabilitation    Complete by:  As directed    [facility]   Diagnosis:   Coronary Stents PTCA     Discharge instructions    Complete by:  As directed  Patient will be discharged to home.  Patient will need to follow up with primary care provider within one week of discharge.  Follow up with cardiology.  Patient should continue medications as prescribed.  Patient should follow a heart healthy diet.     Current Discharge Medication List    START  taking these medications   Details  amLODipine (NORVASC) 5 MG tablet Take 1 tablet (5 mg total) by mouth daily. Qty: 30 tablet, Refills: 0    aspirin EC 81 MG EC tablet Take 1 tablet (81 mg total) by mouth daily. Qty: 30 tablet, Refills: 0    atorvastatin (LIPITOR) 80 MG tablet Take 1 tablet (80 mg total) by mouth daily at 6 PM. Qty: 30 tablet, Refills: 0    carvedilol (COREG) 6.25 MG tablet Take 1 tablet (6.25 mg total) by mouth 2 (two) times daily with a meal. Qty: 60 tablet, Refills: 0    lisinopril (PRINIVIL,ZESTRIL) 10 MG tablet Take 1 tablet (10 mg total) by mouth daily. Qty: 30 tablet, Refills: 0    nitroGLYCERIN (NITROSTAT) 0.4 MG SL tablet Place 1 tablet (0.4 mg total) under the tongue every 5 (five) minutes as needed for chest pain. Qty: 30 tablet, Refills: 0    ticagrelor (BRILINTA) 90 MG TABS tablet Take 1 tablet (90 mg total) by mouth 2 (two) times daily. Qty: 60 tablet, Refills: 0      CONTINUE these medications which have NOT CHANGED   Details  albuterol (PROVENTIL HFA;VENTOLIN HFA) 108 (90 Base) MCG/ACT inhaler Inhale 2 puffs into the lungs every 6 (six) hours as needed for wheezing or shortness of breath (asthma).    ibuprofen (ADVIL,MOTRIN) 200 MG tablet Take 800 mg by mouth 2 (two) times daily as needed for headache (pain).       Allergies  Allergen Reactions  . Codeine Shortness Of Breath      The results of significant diagnostics from this hospitalization (including imaging, microbiology, ancillary and laboratory) are listed below for reference.    Significant Diagnostic Studies: Dg Chest 2 View  Result Date: 09/27/2016 CLINICAL DATA:  Acute onset of generalized chest pain and shortness of breath on exertion. Initial encounter. EXAM: CHEST  2 VIEW COMPARISON:  Chest radiograph performed 05/25/2016 FINDINGS: The lungs are well-aerated. Pulmonary vascularity is at the upper limits of normal. There is no evidence of focal opacification, pleural effusion  or pneumothorax. The heart is normal in size; the mediastinal contour is within normal limits. No acute osseous abnormalities are seen. IMPRESSION: No acute cardiopulmonary process seen. Electronically Signed   By: Roanna Raider M.D.   On: 09/27/2016 23:57    Microbiology: Recent Results (from the past 240 hour(s))  MRSA PCR Screening     Status: Abnormal   Collection Time: 09/28/16  5:45 AM  Result Value Ref Range Status   MRSA by PCR POSITIVE (A) NEGATIVE Final    Comment:        The GeneXpert MRSA Assay (FDA approved for NASAL specimens only), is one component of a comprehensive MRSA colonization surveillance program. It is not intended to diagnose MRSA infection nor to guide or monitor treatment for MRSA infections. RESULT CALLED TO, READ BACK BY AND VERIFIED WITH: DBuzzy Han RN 8:05 09/28/16 ([email or web address])      Labs: Basic Metabolic Panel:  Recent Labs Lab 09/27/16 2343 09/29/16 0339 09/30/16 0416 10/01/16 0150  NA 136 139 138 135  K 3.4* 4.0 3.9 3.7  CL 102 105 105 101  CO2 26 28  26 25  GLUCOSE 182* 101* 100* 67  BUN 12 12 11 11   CREATININE 1.09 1.08 1.10 0.94  CALCIUM 9.0 8.9 9.1 9.2  MG  --  2.0  --   --    Liver Function Tests:  Recent Labs Lab 09/30/16 0416  AST 16  ALT 16*  ALKPHOS 51  BILITOT 0.9  PROT 6.3*  ALBUMIN 3.8   No results for input(s): LIPASE, AMYLASE in the last 168 hours. No results for input(s): AMMONIA in the last 168 hours. CBC:  Recent Labs Lab 09/27/16 2343 09/29/16 0339 09/30/16 0416 10/01/16 0150 10/02/16 0317  WBC 7.8 7.5 7.1 10.8* 10.0  NEUTROABS 3.5  --   --   --   --   HGB 15.2 15.5 15.8 15.9 16.5  HCT 45.4 48.1 49.2 48.8 50.5  MCV 87.0 89.2 89.9 88.4 88.8  PLT 250 256 243 261 287   Cardiac Enzymes:  Recent Labs Lab 09/28/16 0935 09/28/16 1246 10/01/16 0910 10/01/16 1500 10/01/16 1944  TROPONINI <0.03 <0.03 2.31* 2.98* 3.09*   BNP: BNP (last 3 results) No results for input(s): BNP in the last 8760  hours.  ProBNP (last 3 results) No results for input(s): PROBNP in the last 8760 hours.  CBG: No results for input(s): GLUCAP in the last 168 hours.     SignedEdsel Petrin:  Reza Crymes  [facility] 10/02/2016, 9:52 AM

## 2016-10-02 NOTE — Discharge Instructions (Signed)
Angina Pectoris  Angina pectoris, often called angina, is extreme discomfort in the chest, neck, or arm. This is caused by a lack of blood in the middle and thickest layer of the heart wall (myocardium). There are four types of angina:  · Stable angina. Stable angina usually occurs in episodes of predictable frequency and duration. It is usually brought on by physical activity, stress, or excitement. Stable angina usually lasts a few minutes and can often be relieved by a medicine that you place under your tongue. This medicine is called sublingual nitroglycerin.  · Unstable angina. Unstable angina can occur even when you are doing little or no physical activity. It can even occur while you are sleeping or when you are at rest. It can suddenly increase in severity or frequency. It may not be relieved by sublingual nitroglycerin, and it can last up to 30 minutes.  · Microvascular angina. This type of angina is caused by a disorder of tiny blood vessels called arterioles. Microvascular angina is more common in women. The pain may be more severe and last longer than other types of angina pectoris.  · Prinzmetal or variant angina. This type of angina pectoris is rare and usually occurs when you are doing little or no physical activity. It especially occurs in the early morning hours.  CAUSES  Atherosclerosis is the cause of angina. This is the buildup of fat and cholesterol (plaque) on the inside of the arteries. Over time, the plaque may narrow or block the artery, and this will lessen blood flow to the heart. Plaque can also become weak and break off within a coronary artery to form a clot and cause a sudden blockage.  RISK FACTORS  Risk factors common to both men and women include:  · High cholesterol levels.  · High blood pressure (hypertension).  · Tobacco use.  · Diabetes.  · Family history of angina.  · Obesity.  · Lack of exercise.  · A diet high in saturated fats.  Women are at greater risk for angina if they  are:  · Over age 55.  · Postmenopausal.  SYMPTOMS  Many people do not experience any symptoms during the early stages of angina. As the condition progresses, symptoms common to both men and women may include:  · Chest pain.    The pain can be described as a crushing or squeezing in the chest, or a tightness, pressure, fullness, or heaviness in the chest.    The pain can last more than a few minutes, or it can stop and recur.  · Pain in the arms, neck, jaw, or back.  · Unexplained heartburn or indigestion.  · Shortness of breath.  · Nausea.  · Sudden cold sweats.  · Sudden light-headedness.  Many women have chest discomfort and some of the other symptoms. However, women often have different (atypical) symptoms, such as:   · Fatigue.  · Unexplained feelings of nervousness or anxiety.  · Unexplained weakness.  · Dizziness or fainting.  Sometimes, women may have angina without any symptoms.  DIAGNOSIS   Tests to diagnose angina may include:  · ECG (electrocardiogram).  · Exercise stress test. This looks for signs of blockage when the heart is being exercised.  · Pharmacologic stress test. This test looks for signs of blockage when the heart is being stressed with a medicine.  · Blood tests.  · Coronary angiogram. This is a procedure to look at the coronary arteries to see if there is any blockage.    TREATMENT   The treatment of angina may include the following:  · Healthy behavioral changes to reduce or control risk factors.  · Medicine.  · Coronary stenting. A stent helps to keep an artery open.  · Coronary angioplasty. This procedure widens a narrowed or blocked artery.  · Coronary artery bypass surgery. This will allow your blood to pass the blockage (bypass) to reach your heart.  HOME CARE INSTRUCTIONS   · Take medicines only as directed by your health care provider.  · Do not take the following medicines unless your health care provider approves:    Nonsteroidal anti-inflammatory drugs (NSAIDs), such as ibuprofen,  naproxen, or celecoxib.    Vitamin supplements that contain vitamin A, vitamin E, or both.    Hormone replacement therapy that contains estrogen with or without progestin.  · Manage other health conditions such as hypertension and diabetes as directed by your health care provider.  · Follow a heart-healthy diet. A dietitian can help to educate you about healthy food options and changes.  · Use healthy cooking methods such as roasting, grilling, broiling, baking, poaching, steaming, or stir-frying. Talk to a dietitian to learn more about healthy cooking methods.  · Follow an exercise program approved by your health care provider.  · Maintain a healthy weight. Lose weight as approved by your health care provider.  · Plan rest periods when fatigued.  · Learn to manage stress.  · Do not use any tobacco products, including cigarettes, chewing tobacco, or electronic cigarettes. If you need help quitting, ask your health care provider.  · If you drink alcohol, and your health care provider approves, limit your alcohol intake to no more than 1 drink per day. One drink equals 12 ounces of beer, 5 ounces of wine, or 1½ ounces of hard liquor.  · Stop illegal drug use.  · Keep all follow-up visits as directed by your health care provider. This is important.  SEEK IMMEDIATE MEDICAL CARE IF:   · You have pain in your chest, neck, arm, jaw, stomach, or back that lasts more than a few minutes, is recurring, or is unrelieved by taking sublingual nitroglycerin.  · You have profuse sweating without cause.  · You have unexplained:    Heartburn or indigestion.    Shortness of breath or difficulty breathing.    Nausea or vomiting.    Fatigue.    Feelings of nervousness or anxiety.    Weakness.    Diarrhea.  · You have sudden light-headedness or dizziness.  · You faint.  These symptoms may represent a serious problem that is an emergency. Do not wait to see if the symptoms will go away. Get medical help right away. Call your local  emergency services (911 in the [place] Do not drive yourself to the hospital.     This information is not intended to replace advice given to you by your health care provider. Make sure you discuss any questions you have with your health care provider.     Document Released: 12/16/2005 Document Revised: 01/06/2015 Document Reviewed: 04/19/2014  Elsevier Interactive Patient Education ©2016 [employer]

## 2016-10-02 NOTE — Telephone Encounter (Signed)
New message      TCM appt on 10-09-16 with Nada BoozerLaura Ingold per Lillia AbedLindsay

## 2016-10-02 NOTE — Care Management Note (Signed)
Case Management Note  Patient Details  Name: Mike Frazier MRN: [id number] Date of Birth: 12/25/1981  Subjective/Objective:     Admitted with HTN, MI               Action/Plan: Patient lives with his mother and 3 children. Works fulltime at [employer]; no PCP, no [employer] insurance; [facility],2017 at 9:30 am. Brilinta card given to patient with explanation of usage. CM [facility] get his Brilinta medication. Patient stated that he will look into obtaining private medical insurance. Information given on smoking sensation. CM talked to patient about a heart health diet, low in sodium/ fat.   Expected Discharge Date:    10/02/2016              Expected Discharge Plan:  Home/Self Care  Discharge planning Services  CM Consult, Indigent Health Clinic, Medication Assistance   Status of Service:  In process, will continue to follow  Reola MosherChandler, Kessler Solly L, RN,MHA,BSN [phone] 10/02/2016, 10:12 AM

## 2016-10-02 NOTE — Progress Notes (Signed)
CARDIAC REHAB PHASE I   PRE:  Rate/Rhythm: 62  BP:  Sitting: 147/79       SaO2: 99% RA  MODE:  Ambulation: 920 ft   POST:  Rate/Rhythm: 90   BP:  Sitting: 140/83        SaO2: 98% RA  Pt ambulated 920 ft w/o assist and tolerated well.  No c/o of cp or dizziness.  Reviewed smoking cessation again w/ pt and pt verbalized understanding and motivation to quit.  Also reviewed diet and ex guidelines and pt verbalized understanding/teachback. [id number]  Ples SpecterAshley L Tara Rud, MS 10/02/2016 10:42 AM

## 2016-10-03 NOTE — Telephone Encounter (Signed)
[facility] 10[facility].  Patient understands to follow up with provider Nada BoozerLaura Ingold on 10/09/2016 at 10:[address] office.   Patient understands discharge instructions? Patient voiced understanding.  Patient understands medications and regiment? Pt admits to understanding all medications and when to take them.  Patient understands to bring all medications to this visit? Patient voiced understanding.  Pt states he is "relaxing" at home since discharge from hospital. He states he is doing well at this point.

## 2016-10-07 ENCOUNTER — Ambulatory Visit: Payer: Self-pay | Attending: Internal Medicine | Admitting: Physician Assistant

## 2016-10-07 VITALS — BP 127/78 | HR 71 | Temp 98.1°F | Resp 18 | Ht 69.0 in | Wt 227.8 lb

## 2016-10-07 DIAGNOSIS — E785 Hyperlipidemia, unspecified: Secondary | ICD-10-CM

## 2016-10-07 DIAGNOSIS — Z8249 Family history of ischemic heart disease and other diseases of the circulatory system: Secondary | ICD-10-CM

## 2016-10-07 DIAGNOSIS — Z79899 Other long term (current) drug therapy: Secondary | ICD-10-CM | POA: Insufficient documentation

## 2016-10-07 DIAGNOSIS — R7303 Prediabetes: Secondary | ICD-10-CM | POA: Insufficient documentation

## 2016-10-07 DIAGNOSIS — I2510 Atherosclerotic heart disease of native coronary artery without angina pectoris: Secondary | ICD-10-CM | POA: Insufficient documentation

## 2016-10-07 DIAGNOSIS — I25810 Atherosclerosis of coronary artery bypass graft(s) without angina pectoris: Secondary | ICD-10-CM

## 2016-10-07 DIAGNOSIS — Z7982 Long term (current) use of aspirin: Secondary | ICD-10-CM | POA: Insufficient documentation

## 2016-10-07 DIAGNOSIS — I10 Essential (primary) hypertension: Secondary | ICD-10-CM

## 2016-10-07 DIAGNOSIS — F17200 Nicotine dependence, unspecified, uncomplicated: Secondary | ICD-10-CM | POA: Insufficient documentation

## 2016-10-07 MED ORDER — ASPIRIN 81 MG PO TBEC: 81.0000 mg | DELAYED_RELEASE_TABLET | Freq: Every day | ORAL | 3 refills | Status: AC

## 2016-10-07 MED ORDER — LISINOPRIL 10 MG PO TABS: 10.0000 mg | ORAL_TABLET | Freq: Every day | ORAL | 3 refills | Status: DC

## 2016-10-07 MED ORDER — CARVEDILOL 6.25 MG PO TABS: 6.2500 mg | ORAL_TABLET | Freq: Two times a day (BID) | ORAL | 3 refills | Status: DC

## 2016-10-07 MED ORDER — ATORVASTATIN CALCIUM 80 MG PO TABS: 80.0000 mg | ORAL_TABLET | Freq: Every day | ORAL | 3 refills | Status: DC

## 2016-10-07 MED ORDER — TICAGRELOR 90 MG PO TABS: 90.0000 mg | ORAL_TABLET | Freq: Two times a day (BID) | ORAL | 11 refills | Status: DC

## 2016-10-07 MED ORDER — AMLODIPINE BESYLATE 5 MG PO TABS: 5.0000 mg | ORAL_TABLET | Freq: Every day | ORAL | 3 refills | Status: DC

## 2016-10-07 NOTE — Progress Notes (Signed)
Patient is here for HFU   Patient denies pain at this time.  Patient declines the flu vaccine today.

## 2016-10-07 NOTE — Progress Notes (Signed)
Mike SickleJameel Gresham  [id number]  [id number]  DOB - 02/17/1981  Chief Complaint  Patient presents with  . Hospitalization Follow-up       Subjective:   Mike Frazier is a 35 y.o. male here today for establishment of care. He presented to the emergency of September 30 chest pain. His initial troponin was borderline at 0.03. His EKG showed sinus rhythm with nonspecific ST changes. His glucose is elevated at 182. LDL was 105. His potassium was 3.4. He was admitted by the internal medicine team with cardiology consultation. An echo showed no WMA and nl EF. He underwent cath and had a 75% lesion in the left anterior descending artery that was treated with a drug eluding stent. He had unsuccessful treatment of the ramus that was 100%. This was a small caliber vessel. He was started on aspirin and Brilinta. He was placed on guideline directed medical therapy and told to f/u here.  No new complaints. Specifically he denies shortness of breath, dyspnea or chest pain. No palpitations or syncope. He has been compliant with his medications. He has stopped smoking cigs but does have marijuana occasionally.    ROS: GEN: denies fever or chills, denies change in weight Skin: denies lesions or rashes HEENT: denies headache, earache, epistaxis, sore throat, or neck pain LUNGS: denies SHOB, dyspnea, PND, orthopnea CV: denies CP or palpitations ABD: denies abd pain, N or V EXT: denies muscle spasms or swelling; no pain in lower ext, no weakness NEURO: denies numbness or tingling, denies sz, stroke or TIA  ALLERGIES: Allergies  Allergen Reactions  . Codeine Shortness Of Breath    PAST MEDICAL HISTORY: Past Medical History:  Diagnosis Date  . Abscess of finger   . Asthma   . Torsion, testicular     PAST SURGICAL HISTORY: Past Surgical History:  Procedure Laterality Date  . CARDIAC CATHETERIZATION N/A 09/30/2016   Procedure: Left Heart Cath and Coronary Angiography;  Surgeon: Lyn RecordsHenry W  Smith, MD;  [facility];  Service: Cardiovascular;  Laterality: N/A;  . CARDIAC CATHETERIZATION N/A 09/30/2016   Procedure: Intravascular Pressure Wire/FFR Study;  Surgeon: Lyn RecordsHenry W Smith, MD;  [facility];  Service: Cardiovascular;  Laterality: N/A;  . CARDIAC CATHETERIZATION N/A 09/30/2016   Procedure: Coronary Stent Intervention;  Surgeon: Lyn RecordsHenry W Smith, MD;  [facility];  Service: Cardiovascular;  Laterality: N/A;  . I&D EXTREMITY  10/21/2012   Procedure: IRRIGATION AND DEBRIDEMENT EXTREMITY;  Surgeon: Marlowe ShoresMatthew A Weingold, MD;  Location: [facility];  Service: Orthopedics;  Laterality: Left;  . INCISION / DRAINAGE HAND / FINGER    . TESTICLE TORSION REDUCTION      MEDICATIONS AT HOME: Prior to Admission medications   Medication Sig Start Date End Date Taking? Authorizing Provider  albuterol (PROVENTIL HFA;VENTOLIN HFA) 108 (90 Base) MCG/ACT inhaler Inhale 2 puffs into the lungs every 6 (six) hours as needed for wheezing or shortness of breath (asthma).   Yes Historical Provider, MD  amLODipine (NORVASC) 5 MG tablet Take 1 tablet (5 mg total) by mouth daily. 10/07/16  Yes Ivis Henneman Netta CedarsS Jermal Dismuke, PA-C  aspirin 81 MG EC tablet Take 1 tablet (81 mg total) by mouth daily. 10/07/16  Yes Brodan Grewell Netta CedarsS Adebayo Ensminger, PA-C  atorvastatin (LIPITOR) 80 MG tablet Take 1 tablet (80 mg total) by mouth daily at 6 PM. 10/07/16  Yes Aerika Groll Netta CedarsS Burdett Pinzon, PA-C  carvedilol (COREG) 6.25 MG tablet Take 1 tablet (6.25 mg total) by mouth 2 (two) times daily  with a meal. 10/07/16  Yes Heman Que Netta Cedars, PA-C  ibuprofen (ADVIL,MOTRIN) 200 MG tablet Take 800 mg by mouth 2 (two) times daily as needed for headache (pain).   Yes Historical Provider, MD  lisinopril (PRINIVIL,ZESTRIL) 10 MG tablet Take 1 tablet (10 mg total) by mouth daily. 10/07/16  Yes Jamarques Pinedo Netta Cedars, PA-C  nitroGLYCERIN (NITROSTAT) 0.4 MG SL tablet Place 1 tablet (0.4 mg total) under the tongue every 5 (five) minutes as needed for chest pain.  10/02/16  Yes Maryann Mikhail, DO  ticagrelor (BRILINTA) 90 MG TABS tablet Take 1 tablet (90 mg total) by mouth 2 (two) times daily. 10/07/16  Yes Vivianne Master, PA-C     Objective:   Vitals:   10/07/16 0952  BP: 127/78  Pulse: 71  Resp: 18  Temp: 98.1 F (36.7 C)  TempSrc: Oral  SpO2: 100%  Weight: 227 lb 12.8 oz (103.3 kg)  Height: 5\' 9"  (1.753 m)    Exam General appearance : Awake, alert, not in any distress. Speech Clear. Not toxic looking HEENT: Atraumatic and Normocephalic, pupils equally reactive to light and accomodation Neck: supple, no JVD. No cervical lymphadenopathy.  Chest:Good air entry bilaterally, no added sounds  CVS: S1 S2 regular, no murmurs.  Abdomen: Bowel sounds present, Non tender and not distended with no gaurding, rigidity or rebound. Extremities: B/L Lower Ext shows no edema, both legs are warm to touch Neurology: Awake alert, and oriented X 3, CN II-XII intact, Non focal Skin:No Rash  Data Review Lab Results  Component Value Date   HGBA1C 6.1 (H) 09/28/2016   CBG=184  Assessment & Plan  1. CAD s/p PCI DES LAD; residual dz in small caliber ramus  -Cont ASA/Brilinta for at least 1 yr  -RF modification  -Cardiac rehab referral 2. Smoker  -continued cessation discussed   3. Pre DM   -Aim for 30 minutes of exercise most days. Rethink what you drink. Water is great! Aim for 2-3 Carb Choices per meal (30-45 grams) +/- 1 either way  Aim for 0-15 Carbs per snack if hungry  Include protein in moderation with your meals and snacks  Consider reading food labels for Total Carbohydrate and Fat Grams of foods  Consider checking BG at alternate times per day  Continue taking medication as directed Be mindful about how much sugar you are adding to beverages and other foods. Fruit Punch - find one with no sugar  Measure and decrease portions of carbohydrate foods  Make your plate and don't go back for seconds  -no meds at this time, will  follow 4. Fam hx prem CAD  Keep appt later this week with Cardiology Refills given Return in about 4 weeks (around 11/04/2016).  The patient was given clear instructions to go to ER or return to medical center if symptoms don't improve, worsen or new problems develop. The patient verbalized understanding. The patient was told to call to get lab results if they haven't heard anything in the next week.   This note has been created with Education officer, environmental. Any transcriptional errors are unintentional.    Mike Jun, PA-C [facility] and [facility] [place], [place] [phone]   10/07/2016, 10:25 AM

## 2016-10-08 NOTE — Progress Notes (Signed)
Cardiology Office Note   Date:  10/09/2016   ID:  Mike Frazier, DOB 31-May-1981, MRN [id number]  PCP:  No PCP Per Patient  Cardiologist:  Dr. Katrinka Blazing    Chief Complaint  Patient presents with  . Hospitalization Follow-up    no pain      History of Present Illness: Mike Frazier is a 35 y.o. male who presents for post hospital visit for chest pain.  Troponin pk was 3.09.  Cardiac cath 09/30/16 with ramus lesion 100% stenosed and LAD 70-85% stenosis with FFR on 0.74 undergoing DES stent to LAD.  EF > 50%.  Pt on ASA and Brilinta.  Hx tobacco use. +HTN,  Asthma.  Echo with milf LVH.   Strong premature CAD family hx . Hgb A1C.  LDL 105.  Today he feels well.  No chest pain, no SOB. He proudly admits to stopping tobacco.  He was only smoking 10 cigarettes per day but has stopped.  He also has stopped fried foods. We discussed diet he is wanting to change lifestyle.  He is walking and he plans to attend cardiac rehab, has been referred.  He is taking meds appropriately, including Brilinta and ASA.   BP is well controlled.  EKG stable.  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  Will return to work in 2 weeks.      Past Medical History:  Diagnosis Date  . Abscess of finger   . Asthma   . Torsion, testicular     Past Surgical History:  Procedure Laterality Date  . CARDIAC CATHETERIZATION N/A 09/30/2016   Procedure: Left Heart Cath and Coronary Angiography;  Surgeon: Lyn Records, MD;  Location: [facility] INVASIVE CV LAB;  Service: Cardiovascular;  Laterality: N/A;  . CARDIAC CATHETERIZATION N/A 09/30/2016   Procedure: Intravascular Pressure Wire/FFR Study;  Surgeon: Lyn Records, MD;  Location: [facility] INVASIVE CV LAB;  Service: Cardiovascular;  Laterality: N/A;  . CARDIAC CATHETERIZATION N/A 09/30/2016   Procedure: Coronary Stent Intervention;  Surgeon: Lyn Records, MD;  Location: [facility] INVASIVE CV LAB;  Service: Cardiovascular;  Laterality: N/A;  . I&D EXTREMITY  10/21/2012   Procedure:  IRRIGATION AND DEBRIDEMENT EXTREMITY;  Surgeon: Marlowe Shores, MD;  Location: [facility] OR;  Service: Orthopedics;  Laterality: Left;  . INCISION / DRAINAGE HAND / FINGER    . TESTICLE TORSION REDUCTION       Current Outpatient Prescriptions  Medication Sig Dispense Refill  . albuterol (PROVENTIL HFA;VENTOLIN HFA) 108 (90 Base) MCG/ACT inhaler Inhale 2 puffs into the lungs every 6 (six) hours as needed for wheezing or shortness of breath (asthma).    Marland Kitchen amLODipine (NORVASC) 5 MG tablet Take 1 tablet (5 mg total) by mouth daily. 30 tablet 3  . aspirin 81 MG EC tablet Take 1 tablet (81 mg total) by mouth daily. 30 tablet 3  . atorvastatin (LIPITOR) 80 MG tablet Take 1 tablet (80 mg total) by mouth daily at 6 PM. 30 tablet 3  . carvedilol (COREG) 6.25 MG tablet Take 1 tablet (6.25 mg total) by mouth 2 (two) times daily with a meal. 60 tablet 3  . ibuprofen (ADVIL,MOTRIN) 200 MG tablet Take 800 mg by mouth 2 (two) times daily as needed for headache (pain).    Marland Kitchen lisinopril (PRINIVIL,ZESTRIL) 10 MG tablet Take 1 tablet (10 mg total) by mouth daily. 30 tablet 3  . nitroGLYCERIN (NITROSTAT) 0.4 MG SL tablet Place 1 tablet (0.4 mg total) under the tongue every 5 (five) minutes as needed  for chest pain. 30 tablet 0  . ticagrelor (BRILINTA) 90 MG TABS tablet Take 1 tablet (90 mg total) by mouth 2 (two) times daily. 60 tablet 11   No current facility-administered medications for this visit.     Allergies:   Codeine    Social History:  The patient  reports that he quit smoking 11 days ago. His smoking use included Cigarettes. He smoked 0.00 packs per day. He has never used smokeless tobacco. He reports that he uses drugs, including Marijuana. He reports that he does not drink alcohol.   Family History:  The patient's family history includes Heart attack in his sister; Hypertension in his maternal grandmother and mother.    ROS:  General:no colds or fevers, no weight changes Skin:no rashes or  ulcers HEENT:no blurred vision, no congestion CV:see HPI PUL:see HPI GI:no diarrhea constipation or melena, no indigestion GU:no hematuria, no dysuria MS:no joint pain, no claudication Neuro:no syncope, no lightheadedness Endo:no diabetes, no thyroid disease  Wt Readings from Last 3 Encounters:  10/09/16 227 lb 12.8 oz (103.3 kg)  10/07/16 227 lb 12.8 oz (103.3 kg)  10/02/16 217 lb 14.4 oz (98.8 kg)     PHYSICAL EXAM: VS:  BP 118/78 (BP Location: Left Arm, Patient Position: Sitting, Cuff Size: Large)   Pulse 68   Ht 5\' 9"  (1.753 m)   Wt 227 lb 12.8 oz (103.3 kg)   BMI 33.64 kg/m  , BMI Body mass index is 33.64 kg/m. General:Pleasant affect, NAD Skin:Warm and dry, brisk capillary refill HEENT:normocephalic, sclera clear, mucus membranes moist Neck:supple, no JVD, no bruits  Heart:S1S2 RRR without murmur, gallup, rub or click Lungs:clear without rales, rhonchi, or wheezes ZOX:WRUEAbd:soft, non tender, + BS, do not palpate liver spleen or masses Ext:no lower ext edema, 2+ pedal pulses, 2+ radial pulses Neuro:alert and oriented X 3, MAE, follows commands, + facial symmetry GU: pt concerned about erectile dysfunction with BP meds.  Instructed if it does occur to call not to stop meds.      EKG:  EKG is ordered today. The ekg ordered today demonstrates SR with some arrhthymia and incomplete RBBB, improved from discharge.     Recent Labs: 09/29/2016: Magnesium 2.0 09/30/2016: ALT 16 10/01/2016: BUN 11; Creatinine, Ser 0.94; Potassium 3.7; Sodium 135 10/02/2016: Hemoglobin 16.5; Platelets 287    Lipid Panel    Component Value Date/Time   CHOL 172 09/28/2016 0634   TRIG 64 09/28/2016 0634   HDL 54 09/28/2016 0634   CHOLHDL 3.2 09/28/2016 0634   VLDL 13 09/28/2016 0634   LDLCALC 105 (H) 09/28/2016 0634       Other studies Reviewed: Additional studies/ records that were reviewed today include: . ECHO: Study Conclusions  - Left ventricle: The cavity size was normal.  Posterior wall   thickness was increased in a pattern of mild LVH. Systolic   function was vigorous. The estimated ejection fraction was in the   range of 65% to 70%. Wall motion was normal; there were no   regional wall motion abnormalities. Left ventricular diastolic   function parameters were normal. - Aortic valve: Transvalvular velocity was within the normal range.   There was no stenosis. There was no regurgitation. - Mitral valve: Transvalvular velocity was within the normal range.   There was no evidence for stenosis. There was no regurgitation. - Right ventricle: The cavity size was normal. Wall thickness was   normal. Systolic function was normal. - Tricuspid valve: There was no regurgitation.  CARDIAC CATH: Procedures   Coronary Stent Intervention  Intravascular Pressure Wire/FFR Study  Left Heart Cath and Coronary Angiography  Conclusion     A stent was not successfully placed.  Lat Ramus lesion, 100 %stenosed.  Post intervention, there is a 99% residual stenosis.  The left ventricular ejection fraction is 50-55% by visual estimate.  The left ventricular systolic function is normal.  LV end diastolic pressure is mildly elevated.  Mid LAD lesion, 75 %stenosed.  Post intervention, there is a 0% residual stenosis.  A STENT PROMUS PREM MR 2.75X16 drug eluting stent was successfully placed.    70-85% stenosis in the mid LAD with resting FFR of 0.74.  Successful drug-eluting stent implantation reducing the 85% stenosis to 0% with TIMI grade 3 flow.  Catheter-based embolic occlusion (thrombus) of a branch in the distal ramus intermedius. Once identified, the lesion was treated with wire probing which allowed TIMI grade 1-2 flow to occur. The vessel segment was too small to dilate with the balloon.  Normal right coronary and circumflex.  Normal left ventricular systolic function. EF greater than 50%.  RECOMMENDATIONS:   Aspirin and  Brilinta  Angiomax 6 hours  Subcutaneous heparin for DVT prophylaxis  Hospitalization for at least an additional 24-36 hours.  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 USAP/NSTEMI - multiple CAD risk factors including sister with CAD in her 30s with multiple stents. Admitted with exertional chest pain with mild to moderate level of activity. -  EKG suggests possible old inferior infarct.  - cardiac cath demonstrated significant stenosis in the LAD with abnormal FFR. This was successfully stented with DES. Complicated by catheter related embolus into distal Ramus branch. Ecg is stable. Cycle troponin today. Continue DAPT for one year. Statin Rx. On BB Follow up with Dr. Katrinka Blazing in 2 months. Return to work in 2 weeks.  2. HTN - controlled on Coreg and ACEi and amlodipine.   3. Hypercholesterolemia. On high dose statin. Check hepatic and lipids in 6 weeks.   4. Tobacco abuse-  congratulated on stopping tobacco.    Current medicines are reviewed with the patient today.  The patient Has no concerns regarding medicines.  The following changes have been made:  See above Labs/ tests ordered today include:see above  Disposition:   FU:  see above  Signed, Nada Boozer, NP  10/09/2016 2:06 PM    [facility] [address] [place], [place], [place]  [address]/ [address] [place], [place] Phone: [phone]; Fax: [phone]  [phone]

## 2016-10-09 ENCOUNTER — Encounter: Payer: Self-pay | Admitting: Cardiology

## 2016-10-09 ENCOUNTER — Ambulatory Visit (INDEPENDENT_AMBULATORY_CARE_PROVIDER_SITE_OTHER): Payer: Self-pay | Admitting: Cardiology

## 2016-10-09 ENCOUNTER — Encounter (INDEPENDENT_AMBULATORY_CARE_PROVIDER_SITE_OTHER): Payer: Self-pay

## 2016-10-09 VITALS — BP 118/78 | HR 68 | Ht 69.0 in | Wt 227.8 lb

## 2016-10-09 DIAGNOSIS — I2510 Atherosclerotic heart disease of native coronary artery without angina pectoris: Secondary | ICD-10-CM

## 2016-10-09 DIAGNOSIS — I10 Essential (primary) hypertension: Secondary | ICD-10-CM

## 2016-10-09 DIAGNOSIS — Z955 Presence of coronary angioplasty implant and graft: Secondary | ICD-10-CM

## 2016-10-09 NOTE — Patient Instructions (Signed)
Medication Instructions:  Your physician recommends that you continue on your current medications as directed. Please refer to the Current Medication list given to you today.   Labwork: Your physician recommends that you return for a FASTING lipid profile and hepatic in 6 weeks   Testing/Procedures: None ordered  Follow-Up: Your physician recommends that you schedule a follow-up appointment in: 2 months with Dr.Smith   Any Other Special Instructions Will Be Listed Below (If Applicable). Remain out of work for the next 2 weeks     If you need a refill on your cardiac medications before your next appointment, please call your pharmacy.

## 2016-11-08 ENCOUNTER — Telehealth: Payer: Self-pay | Admitting: Cardiology

## 2016-11-08 NOTE — Telephone Encounter (Signed)
LMTCB

## 2016-11-08 NOTE — Telephone Encounter (Signed)
New Message:    Pt would like for Vernona RiegerLaura to prescribe him another medicine in the place of his Brilinta. He just can not afford the Brilinta.

## 2016-11-11 NOTE — Telephone Encounter (Signed)
Pt states he can not afford Brilinta. He states the last prescription cost him $250.  I advised I would put samples up front for him. I also advised him about [facility].  He states has seen Scot Juniffany Noel, PA-C at [facility], has to call back on 11/18/16 to make f/u appt with her. I advised him to speak with a financial counselor there to inquire about [employer] or other financial assistance with medication. I also advised him there is a pharmacy at [facility], prescriptions may cost less there. He voiced understanding and thanks.  He states he will pick up samples today before 1700.

## 2016-11-12 NOTE — Telephone Encounter (Signed)
Change to Plavix 150 mg once then 75 mg daily.

## 2016-11-12 NOTE — Telephone Encounter (Signed)
Change to plavix 75 mg daily. Start with 150 mg dose then decrease to 75 mg daily.

## 2016-11-12 NOTE — Telephone Encounter (Signed)
When pt is running low on Brilinta then switch to Plavix per Dr. Michaelle CopasSmith's note thanks.

## 2016-11-14 ENCOUNTER — Telehealth: Payer: Self-pay | Admitting: *Deleted

## 2016-11-14 NOTE — Telephone Encounter (Signed)
Called pt per staff message from Nada BoozerLaura Ingold, NP,  " Leone BrandLaura R Ingold, NP  Elliot CousinJennifer Merrell Rettinger, RMA; Julio SicksJennifer L Bowers, LPN        Victorino DikeJennifer- sending to both- I keep getting these messages and I don't know if anyone has done this, pt was low on Brilinta and we gave some samples but see plan below to switch to Plavix after initial dose of 150 mg.   Lyn RecordsHenry W Smith, MD You 35 minutes ago (1:50 PM)    Switch patient to Plavix 75 mg per day after an initial dose of 150 mg.   Documentation   You Lyn RecordsHenry W Smith, MD 2 days ago    When pt is running low on Brilinta then switch to Plavix per Dr. Michaelle CopasSmith's note thanks.    Per pt 11-14-16, he just paid for his Brilinta $251 and got 2 weeks of samples from the office.  Pt was advised to use what he had already paid for and to call the office when he notices he is down to 1 week worth and we will make the switch to Plavix, per documentation above. Pt is agreeable with this plan and verbalized the appreciation.   Note routed to Nada BoozerLaura Ingold, NP Dr. Guadlupe SpanishSmith Meriah Shands Bowers

## 2016-11-14 NOTE — Telephone Encounter (Signed)
Switch patient to Plavix 75 mg per day after an initial dose of 150 mg.

## 2016-11-20 ENCOUNTER — Other Ambulatory Visit: Payer: Self-pay | Admitting: *Deleted

## 2016-11-20 ENCOUNTER — Other Ambulatory Visit: Payer: Self-pay

## 2016-12-06 ENCOUNTER — Encounter: Payer: Self-pay | Admitting: Interventional Cardiology

## 2016-12-11 NOTE — Progress Notes (Signed)
Cardiology Office Note    Date:  12/12/2016   ID:  Mike Frazier, DOB 11/13/1981, MRN [id number]  PCP:  No PCP Per Patient  Cardiologist: Lesleigh NoeHenry W Gregroy Dombkowski III, MD   Chief Complaint  Patient presents with  . Coronary Artery Disease    History of Present Illness:  Mike Frazier is a 35 y.o. male who presents for h/o NSTEMI, ramus lesion 100% stenosed and LAD 70-85% stenosis with FFR on 0.74  resulting in DES stent to LAD, DIastolic HF EF 50%, Hx tobacco use, HTN, and asthma.  He is doing well. He has had difficulty obtaining/affording his medications. He has discontinued smoking. He has not had either of his antihypertensive medications today. He is very thankful for the care he received.  Past Medical History:  Diagnosis Date  . Abscess of finger   . Asthma   . Torsion, testicular     Past Surgical History:  Procedure Laterality Date  . CARDIAC CATHETERIZATION N/A 09/30/2016   Procedure: Left Heart Cath and Coronary Angiography;  Surgeon: Lyn RecordsHenry W Keanen Dohse, MD;  [facility];  Service: Cardiovascular;  Laterality: N/A;  . CARDIAC CATHETERIZATION N/A 09/30/2016   Procedure: Intravascular Pressure Wire/FFR Study;  Surgeon: Lyn RecordsHenry W Amaya Blakeman, MD;  [facility];  Service: Cardiovascular;  Laterality: N/A;  . CARDIAC CATHETERIZATION N/A 09/30/2016   Procedure: Coronary Stent Intervention;  Surgeon: Lyn RecordsHenry W Nelwyn Hebdon, MD;  [facility];  Service: Cardiovascular;  Laterality: N/A;  . I&D EXTREMITY  10/21/2012   Procedure: IRRIGATION AND DEBRIDEMENT EXTREMITY;  Surgeon: Marlowe ShoresMatthew A Weingold, MD;  Location: [facility] OR;  Service: Orthopedics;  Laterality: Left;  . INCISION / DRAINAGE HAND / FINGER    . TESTICLE TORSION REDUCTION      Current Medications: Outpatient Medications Prior to Visit  Medication Sig Dispense Refill  . albuterol (PROVENTIL HFA;VENTOLIN HFA) 108 (90 Base) MCG/ACT inhaler Inhale 2 puffs into the lungs every 6 (six) hours as needed  for wheezing or shortness of breath (asthma).    Marland Kitchen. amLODipine (NORVASC) 5 MG tablet Take 1 tablet (5 mg total) by mouth daily. 30 tablet 3  . aspirin 81 MG EC tablet Take 1 tablet (81 mg total) by mouth daily. 30 tablet 3  . atorvastatin (LIPITOR) 80 MG tablet Take 1 tablet (80 mg total) by mouth daily at 6 PM. 30 tablet 3  . carvedilol (COREG) 6.25 MG tablet Take 1 tablet (6.25 mg total) by mouth 2 (two) times daily with a meal. 60 tablet 3  . ibuprofen (ADVIL,MOTRIN) 200 MG tablet Take 800 mg by mouth 2 (two) times daily as needed for headache (pain).    Marland Kitchen. lisinopril (PRINIVIL,ZESTRIL) 10 MG tablet Take 1 tablet (10 mg total) by mouth daily. 30 tablet 3  . nitroGLYCERIN (NITROSTAT) 0.4 MG SL tablet Place 1 tablet (0.4 mg total) under the tongue every 5 (five) minutes as needed for chest pain. 30 tablet 0  . ticagrelor (BRILINTA) 90 MG TABS tablet Take 1 tablet (90 mg total) by mouth 2 (two) times daily. 60 tablet 11   No facility-administered medications prior to visit.      Allergies:   Codeine   Social History   Social History  . Marital status: Single    Spouse name: N/A  . Number of children: N/A  . Years of education: N/A   Social History Main Topics  . Smoking status: Former Smoker    Packs/day: 0.00    Types: Cigarettes  Quit date: 09/28/2016  . Smokeless tobacco: Never Used  . Alcohol use No  . Drug use:     Types: Marijuana  . Sexual activity: Not Asked   Other Topics Concern  . None   Social History Narrative  . None     Family History:  The patient's family history includes Heart attack in his sister; Hypertension in his maternal grandmother and mother.   ROS:   Please see the history of present illness.    Financial constraints otherwise no problems.  All other systems reviewed and are negative.   PHYSICAL EXAM:   VS:  BP (!) 162/100 (BP Location: Left Arm)   Pulse 68   Ht 5' 9.5" (1.765 m)   Wt 230 lb 9.6 oz (104.6 kg)   BMI 33.57 kg/m    GEN:  Well nourished, well developed, in no acute distress  HEENT: normal  Neck: no JVD, carotid bruits, or masses Cardiac: RRR; no murmurs, rubs, or gallops,no edema  Respiratory:  clear to auscultation bilaterally, normal work of breathing GI: soft, nontender, nondistended, + BS MS: no deformity or atrophy  Skin: warm and dry, no rash Neuro:  Alert and Oriented x 3, Strength and sensation are intact Psych: euthymic mood, full affect  Wt Readings from Last 3 Encounters:  12/12/16 230 lb 9.6 oz (104.6 kg)  10/09/16 227 lb 12.8 oz (103.3 kg)  10/07/16 227 lb 12.8 oz (103.3 kg)      Studies/Labs Reviewed:   EKG:  EKG  Not repeated  Recent Labs: 09/29/2016: Magnesium 2.0 09/30/2016: ALT 16 10/01/2016: BUN 11; Creatinine, Ser 0.94; Potassium 3.7; Sodium 135 10/02/2016: Hemoglobin 16.5; Platelets 287   Lipid Panel    Component Value Date/Time   CHOL 172 09/28/2016 0634   TRIG 64 09/28/2016 0634   HDL 54 09/28/2016 0634   CHOLHDL 3.2 09/28/2016 0634   VLDL 13 09/28/2016 0634   LDLCALC 105 (H) 09/28/2016 0634    Additional studies/ records that were reviewed today include:   Coronary angiography and PCI10/2/17  Diagnostic Diagram     Post-Intervention Diagram          ASSESSMENT:    1. NSTEMI (non-ST elevated myocardial infarction) (HCC)   2. Status post coronary artery stent placement   3. Essential hypertension   4. Hyperlipidemia, unspecified hyperlipidemia type      PLAN:  In order of problems listed above:  1. No recurrence of chest discomfort. Denies dyspnea. No clinical evidence of LV dysfunction. 2. Discontinue Brilinta after he uses a current supply. He needs to start taking) a grill 75 mg per day with the last tablet of Brilinta. The Plavix will be 70 50 g daily thereafter. 3. He is out of both lisinopril and amlodipine. All of his [facility]. 4. He needs a liver and lipid today.  Overall clinical  follow-up with me in 6 months.    Medication Adjustments/Labs and Tests Ordered: Current medicines are reviewed at length with the patient today.  Concerns regarding medicines are outlined above.  Medication changes, Labs and Tests ordered today are listed in the Patient Instructions below. There are no Patient Instructions on file for this visit.   Signed, Lesleigh NoeHenry W Carrick Rijos III, MD  12/12/2016 10:49 AM    [facility] [address], [place], [place]  [address] Phone: [phone]; Fax: [phone]

## 2016-12-12 ENCOUNTER — Encounter: Payer: Self-pay | Admitting: Interventional Cardiology

## 2016-12-12 ENCOUNTER — Ambulatory Visit (INDEPENDENT_AMBULATORY_CARE_PROVIDER_SITE_OTHER): Payer: Self-pay | Admitting: Interventional Cardiology

## 2016-12-12 ENCOUNTER — Encounter (INDEPENDENT_AMBULATORY_CARE_PROVIDER_SITE_OTHER): Payer: Self-pay

## 2016-12-12 VITALS — BP 162/100 | HR 68 | Ht 69.5 in | Wt 230.6 lb

## 2016-12-12 DIAGNOSIS — E785 Hyperlipidemia, unspecified: Secondary | ICD-10-CM

## 2016-12-12 DIAGNOSIS — I214 Non-ST elevation (NSTEMI) myocardial infarction: Secondary | ICD-10-CM

## 2016-12-12 DIAGNOSIS — Z955 Presence of coronary angioplasty implant and graft: Secondary | ICD-10-CM

## 2016-12-12 DIAGNOSIS — I10 Essential (primary) hypertension: Secondary | ICD-10-CM

## 2016-12-12 LAB — HEPATIC FUNCTION PANEL
ALT: 16 U/L (ref 9–46)
AST: 18 U/L (ref 10–40)
Albumin: 4.5 g/dL (ref 3.6–5.1)
Alkaline Phosphatase: 59 U/L (ref 40–115)
BILIRUBIN DIRECT: 0.1 mg/dL (ref <=0.2)
BILIRUBIN INDIRECT: 0.5 mg/dL (ref 0.2–1.2)
Total Bilirubin: 0.6 mg/dL (ref 0.2–1.2)
Total Protein: 7.1 g/dL (ref 6.1–8.1)

## 2016-12-12 LAB — LIPID PANEL
Cholesterol: 185 mg/dL (ref <200)
HDL: 57 mg/dL (ref >40)
LDL CALC: 107 mg/dL — AB (ref <100)
Total CHOL/HDL Ratio: 3.2 Ratio (ref <5.0)
Triglycerides: 105 mg/dL (ref <150)
VLDL: 21 mg/dL (ref <30)

## 2016-12-12 MED ORDER — AMLODIPINE BESYLATE 5 MG PO TABS: 5.0000 mg | ORAL_TABLET | Freq: Every day | ORAL | 5 refills | Status: DC

## 2016-12-12 MED ORDER — LISINOPRIL 10 MG PO TABS: 10.0000 mg | ORAL_TABLET | Freq: Every day | ORAL | 5 refills | Status: DC

## 2016-12-12 MED ORDER — ATORVASTATIN CALCIUM 80 MG PO TABS: 80.0000 mg | ORAL_TABLET | Freq: Every day | ORAL | 5 refills | Status: DC

## 2016-12-12 MED ORDER — CARVEDILOL 6.25 MG PO TABS: 6.2500 mg | ORAL_TABLET | Freq: Two times a day (BID) | ORAL | 5 refills | Status: DC

## 2016-12-12 MED ORDER — CLOPIDOGREL BISULFATE 75 MG PO TABS: 75.0000 mg | ORAL_TABLET | Freq: Every day | ORAL | 5 refills | Status: DC

## 2016-12-12 MED FILL — CLOPIDOGREL 75 MG TABLET: 75 | 30 days supply | Qty: 30 | Fill #0

## 2016-12-12 MED FILL — CARVEDILOL 6.25 MG TABLET: 6.25 | 30 days supply | Qty: 60 | Fill #0

## 2016-12-12 MED FILL — AMLODIPINE BESYLATE 5 MG TA: 5 | 30 days supply | Qty: 30 | Fill #0

## 2016-12-12 MED FILL — LISINOPRIL 10 MG TABLET: 10 | 30 days supply | Qty: 30 | Fill #0

## 2016-12-12 MED FILL — ATORVASTATIN 80 MG TABLET: 80 | 30 days supply | Qty: 30 | Fill #0

## 2016-12-12 NOTE — Patient Instructions (Signed)
Medication Instructions:  Your physician has recommended you make the following change in your medication: Continue Brilinta until supply runs out. The last tablet of Brilinta should be taking with your first tablet of Plavix. Then continue Plavix 75 mg by mouth daily.   Labwork: Your physician recommends that you get lab work today: Liver and Lipid   Testing/Procedures: None ordered  Follow-Up: Your physician wants you to follow-up in 6 months with Dr. Katrinka BlazingSmith. You will receive a reminder letter in the mail two months in advance. If you don't receive a letter, please call our office to schedule the follow-up appointment.   Any Other Special Instructions Will Be Listed Below (If Applicable).     If you need a refill on your cardiac medications before your next appointment, please call your pharmacy.

## 2017-02-03 MED FILL — CLOPIDOGREL 75 MG TABLET: 75 | 30 days supply | Qty: 30 | Fill #1

## 2017-02-03 MED FILL — LISINOPRIL 10 MG TABLET: 10 | 30 days supply | Qty: 30 | Fill #1

## 2017-02-03 MED FILL — ATORVASTATIN 80 MG TABLET: 80 | 30 days supply | Qty: 30 | Fill #1

## 2017-02-03 MED FILL — ?AMLODIPINE BESYLATE 5 MG T: 5 | 30 days supply | Qty: 30 | Fill #1

## 2017-02-03 MED FILL — ?CARVEDILOL 6.25 MG TABLET: 6.25 | 30 days supply | Qty: 60 | Fill #1

## 2017-04-18 MED FILL — ?AMLODIPINE BESYLATE 5 MG T: 5 | 30 days supply | Qty: 30 | Fill #2

## 2017-04-18 MED FILL — CARVEDILOL 6.25 MG TABLET: 6.25 | 30 days supply | Qty: 60 | Fill #2

## 2017-04-18 MED FILL — LISINOPRIL 10 MG TABLET: 10 | 30 days supply | Qty: 30 | Fill #2

## 2017-04-18 MED FILL — CLOPIDOGREL 75 MG TABLET: 75 | 30 days supply | Qty: 30 | Fill #2

## 2017-04-18 MED FILL — ATORVASTATIN 80 MG TABLET: 80 | 30 days supply | Qty: 30 | Fill #2

## 2017-06-18 ENCOUNTER — Emergency Department (HOSPITAL_BASED_OUTPATIENT_CLINIC_OR_DEPARTMENT_OTHER)
Admission: EM | Admit: 2017-06-18 | Discharge: 2017-06-18 | Disposition: A | Discharge status: Home / Self Care | Payer: Self-pay | Attending: Emergency Medicine | Admitting: Emergency Medicine

## 2017-06-18 ENCOUNTER — Encounter (HOSPITAL_BASED_OUTPATIENT_CLINIC_OR_DEPARTMENT_OTHER): Payer: Self-pay

## 2017-06-18 DIAGNOSIS — Y999 Unspecified external cause status: Secondary | ICD-10-CM | POA: Insufficient documentation

## 2017-06-18 DIAGNOSIS — W1849XA Other slipping, tripping and stumbling without falling, initial encounter: Secondary | ICD-10-CM | POA: Insufficient documentation

## 2017-06-18 DIAGNOSIS — Z7982 Long term (current) use of aspirin: Secondary | ICD-10-CM | POA: Insufficient documentation

## 2017-06-18 DIAGNOSIS — Y939 Activity, unspecified: Secondary | ICD-10-CM | POA: Insufficient documentation

## 2017-06-18 DIAGNOSIS — S76211A Strain of adductor muscle, fascia and tendon of right thigh, initial encounter: Secondary | ICD-10-CM | POA: Insufficient documentation

## 2017-06-18 DIAGNOSIS — Z7902 Long term (current) use of antithrombotics/antiplatelets: Secondary | ICD-10-CM | POA: Insufficient documentation

## 2017-06-18 DIAGNOSIS — F17210 Nicotine dependence, cigarettes, uncomplicated: Secondary | ICD-10-CM | POA: Insufficient documentation

## 2017-06-18 DIAGNOSIS — J45909 Unspecified asthma, uncomplicated: Secondary | ICD-10-CM | POA: Insufficient documentation

## 2017-06-18 DIAGNOSIS — I10 Essential (primary) hypertension: Secondary | ICD-10-CM | POA: Insufficient documentation

## 2017-06-18 DIAGNOSIS — Z79899 Other long term (current) drug therapy: Secondary | ICD-10-CM | POA: Insufficient documentation

## 2017-06-18 DIAGNOSIS — Y92511 Restaurant or cafe as the place of occurrence of the external cause: Secondary | ICD-10-CM | POA: Insufficient documentation

## 2017-06-18 DIAGNOSIS — I2510 Atherosclerotic heart disease of native coronary artery without angina pectoris: Secondary | ICD-10-CM | POA: Insufficient documentation

## 2017-06-18 HISTORY — DX: Atherosclerotic heart disease of native coronary artery without angina pectoris: I25.10

## 2017-06-18 MED ORDER — KETOROLAC TROMETHAMINE 60 MG/2ML IM SOLN
60.0000 mg | Freq: Once | INTRAMUSCULAR | Status: AC
  Administered 2017-06-18: 60 mg via INTRAMUSCULAR
  Filled 2017-06-18: qty 2

## 2017-06-18 MED ORDER — METHOCARBAMOL 500 MG PO TABS: 500.0000 mg | ORAL_TABLET | Freq: Two times a day (BID) | ORAL | 0 refills | Status: DC

## 2017-06-18 MED ORDER — NAPROXEN 500 MG PO TABS: 500.0000 mg | ORAL_TABLET | Freq: Two times a day (BID) | ORAL | 0 refills | Status: DC

## 2017-06-18 MED ORDER — TRAMADOL HCL 50 MG PO TABS: 50.0000 mg | ORAL_TABLET | Freq: Four times a day (QID) | ORAL | 0 refills | Status: DC | PRN

## 2017-06-18 NOTE — Discharge Instructions (Signed)
Ice, rest, elevate your leg. Avoid strenuous activity. Ace wrap for compression and support. Naprosyn for pain and inflammation. Robaxin for spasms. Tramadol for severe pain. Follow up with family doctor or sports medicine.

## 2017-06-18 NOTE — ED Provider Notes (Signed)
[facility] Provider Note   CSN: [id number] Arrival date & time: 06/18/17  2039  By signing my name below, I, Mike Frazier, attest that this documentation has been prepared under the direction and in the presence of Mike Torti, PA-C. Electronically Signed: Phillips Frazier, Scribe. 06/18/2017. 10:45 PM.   History   Chief Complaint Chief Complaint  Patient presents with  . Hip Pain   HPI Comments Mike Frazier is a 36 y.o. male with a PMHx significant for HTN and HLD, who presents to the Emergency Department with complaints of sudden onset right hip and leg pain x2 days.  Pt states that he was at a restaurant x3 days ago when he slipped.  Pt was able to catch himself before falling and initially was in no pain.  It has been gradually worsening since onset, currently rated a 7/10 in severity.  He experienced minimal symptomatic relief with the use of OTC ibuprofen.  He has been unable to bear weight 2/2 pain. Pt denies experiencing any other acute sx, including numbness or weakness.   The history is provided by the patient. No language interpreter was used.    Past Medical History:  Diagnosis Date  . Abscess of finger   . Asthma   . Coronary artery disease   . Torsion, testicular     Patient Active Problem List   Diagnosis Date Noted  . Hyperlipidemia 12/12/2016  . Unstable angina (HCC) 10/01/2016  . Status post coronary artery stent placement   . NSTEMI (non-ST elevated myocardial infarction) (HCC) 09/30/2016  . Precordial pain   . Essential hypertension 09/28/2016  . Hypokalemia 09/28/2016  . Felon 10/21/2012    Past Surgical History:  Procedure Laterality Date  . CARDIAC CATHETERIZATION N/A 09/30/2016   Procedure: Left Heart Cath and Coronary Angiography;  Surgeon: Lyn Records, MD;  Location: [facility] INVASIVE CV LAB;  Service: Cardiovascular;  Laterality: N/A;  . CARDIAC CATHETERIZATION N/A 09/30/2016   Procedure: Intravascular Pressure Wire/FFR  Study;  Surgeon: Lyn Records, MD;  Location: [facility] INVASIVE CV LAB;  Service: Cardiovascular;  Laterality: N/A;  . CARDIAC CATHETERIZATION N/A 09/30/2016   Procedure: Coronary Stent Intervention;  Surgeon: Lyn Records, MD;  Location: [facility] INVASIVE CV LAB;  Service: Cardiovascular;  Laterality: N/A;  . CORONARY STENT INTERVENTION    . I&D EXTREMITY  10/21/2012   Procedure: IRRIGATION AND DEBRIDEMENT EXTREMITY;  Surgeon: Marlowe Shores, MD;  Location: [facility] OR;  Service: Orthopedics;  Laterality: Left;  . INCISION / DRAINAGE HAND / FINGER    . TESTICLE TORSION REDUCTION         Home Medications    Prior to Admission medications   Medication Sig Start Date End Date Taking? Authorizing Provider  albuterol (PROVENTIL HFA;VENTOLIN HFA) 108 (90 Base) MCG/ACT inhaler Inhale 2 puffs into the lungs every 6 (six) hours as needed for wheezing or shortness of breath (asthma).    [provider]  amLODipine (NORVASC) 5 MG tablet Take 1 tablet (5 mg total) by mouth daily. 12/12/16   Lyn Records, MD  aspirin 81 MG EC tablet Take 1 tablet (81 mg total) by mouth daily. 10/07/16   Vivianne Master, PA-C  atorvastatin (LIPITOR) 80 MG tablet Take 1 tablet (80 mg total) by mouth daily at 6 PM. 12/12/16   Lyn Records, MD  carvedilol (COREG) 6.25 MG tablet Take 1 tablet (6.25 mg total) by mouth 2 (two) times daily with a meal. 12/12/16   Verdis Prime  W, MD  clopidogrel (PLAVIX) 75 MG tablet Take 1 tablet (75 mg total) by mouth daily. 12/12/16   Lyn Records, MD  ibuprofen (ADVIL,MOTRIN) 200 MG tablet Take 800 mg by mouth 2 (two) times daily as needed for headache (pain).    [provider]  lisinopril (PRINIVIL,ZESTRIL) 10 MG tablet Take 1 tablet (10 mg total) by mouth daily. 12/12/16   Lyn Records, MD  nitroGLYCERIN (NITROSTAT) 0.4 MG SL tablet Place 1 tablet (0.4 mg total) under the tongue every 5 (five) minutes as needed for chest pain. 10/02/16   Edsel Petrin, DO    Family  History Family History  Problem Relation Age of Onset  . Hypertension Mother   . Heart attack Sister        2 MI before age 77  . Hypertension Maternal Grandmother     Social History Social History  Substance Use Topics  . Smoking status: Current Some Day Smoker    Packs/day: 0.00    Types: Cigarettes  . Smokeless tobacco: Never Used  . Alcohol use Yes     Comment: occ     Allergies   Codeine   Review of Systems Review of Systems  Constitutional: Negative for chills and fever.  Gastrointestinal: Negative for nausea and vomiting.  Musculoskeletal: Positive for arthralgias, gait problem and myalgias. Negative for back pain.  Skin: Negative for wound.  Neurological: Negative for weakness and numbness.  All other systems reviewed and are negative.  Physical Exam Updated Vital Signs BP (!) 161/98 (BP Location: Right Arm)   Pulse 79   Temp 98.9 F (37.2 C) (Oral)   Resp 20   Ht 5\' 9"  (1.753 m)   Wt 230 lb (104.3 kg)   SpO2 99%   BMI 33.97 kg/m   Physical Exam  Constitutional: He is oriented to person, place, and time. He appears well-developed and well-nourished. No distress.  HENT:  Head: Normocephalic and atraumatic.  Eyes: Conjunctivae are normal.  Pulmonary/Chest: Effort normal.  Musculoskeletal: Normal range of motion. He exhibits no edema or deformity.  Tenderness to palpation over anterior groin over the flexor muscles. Tenderness extends into the medial thigh over abductor magnus muscle into the medial knee. Full range of motion of the right hip passively. Patient has severe pain with actively flexing or abducting or internally rotating right hip. Full range of motion of the knee joint. No bruising, swelling, deformity. Dorsal pedal pulses are intact and equal bilaterally. Normal ankles.  Neurological: He is alert and oriented to person, place, and time. No cranial nerve deficit or sensory deficit. He exhibits normal muscle tone. Coordination normal.  Skin:  Skin is warm and dry. Capillary refill takes less than 2 seconds.  Psychiatric: He has a normal mood and affect.  Nursing note and vitals reviewed.  ED Treatments / Results  DIAGNOSTIC STUDIES: Oxygen Saturation is 99% on room air, normal by my interpretation.    COORDINATION OF CARE: 10:40 PM Discussed treatment plan with pt at bedside and pt agreed to plan.  Labs (all labs ordered are listed, but only abnormal results are displayed) Labs Reviewed - No data to display  EKG  EKG Interpretation None       Radiology No results found.  Procedures Procedures (including critical care time)  Medications Ordered in ED Medications - No data to display   Initial Impression / Assessment and Plan / ED Course  I have reviewed the triage vital signs and the nursing notes.  Pertinent labs &  imaging results that were available during my care of the patient were reviewed by me and considered in my medical decision making (see chart for details).     Patient with right groin/thigh pain after catches himself from falling 3 days ago. He did not have pain at that time of the incident, but developed pain in the next day, and since then has had difficulty flexing his right hip. There is no obvious bruising or swelling or deformity. I do not suspect any complete muscle rupture. He does have severe pain with movement of the flexor muscles. Suspect a strain. Do not think he needs any x-rays. He did not fall to the ground. Will place in an Ace wrap for support. He has crutches with him. Home with ice, rest, NSAIDs, Robaxin for spasms. Follow-up with family doctor as needed for recheck.  Vitals:   06/18/17 2054 06/18/17 2055  BP: (!) 161/98   Pulse: 79   Resp: 20   Temp: 98.9 F (37.2 C)   TempSrc: Oral   SpO2: 99%   Weight:  104.3 kg (230 lb)  Height:  5\' 9"  (1.753 m)     Final Clinical Impressions(s) / ED Diagnoses   Final diagnoses:  Strain of adductor magnus muscle, right, initial  encounter   I personally performed the services described in this documentation, which was scribed in my presence. The recorded information has been reviewed and is accurate.  New Prescriptions Discharge Medication List as of 06/18/2017 11:33 PM    START taking these medications   Details  methocarbamol (ROBAXIN) 500 MG tablet Take 1 tablet (500 mg total) by mouth 2 (two) times daily., Starting Wed 06/18/2017, Print    naproxen (NAPROSYN) 500 MG tablet Take 1 tablet (500 mg total) by mouth 2 (two) times daily., Starting Wed 06/18/2017, Print    traMADol (ULTRAM) 50 MG tablet Take 1 tablet (50 mg total) by mouth every 6 (six) hours as needed., Starting Wed 06/18/2017, Print         Ashwika Freels, [place], PA-C 06/19/17 0102    Alvira MondaySchlossman, Erin, MD 06/19/17 1319

## 2017-06-18 NOTE — ED Triage Notes (Signed)
Pt states he slipped 6/17-caught self before fall-c/o pain to right and right LE-presents to triage with one crutch-NAD

## 2017-09-19 MED FILL — AMLODIPINE BESYLATE 5 MG TA: 5 | 30 days supply | Qty: 30 | Fill #3

## 2017-09-19 MED FILL — ?CLOPIDOGREL 75MG TAB: 75 | 30 days supply | Qty: 30 | Fill #3

## 2017-09-19 MED FILL — LISINOPRIL 10 MG TABS: 10 | 30 days supply | Qty: 30 | Fill #3

## 2017-09-19 MED FILL — ?CARVEDILOL 6.25 MG TABLET: 6.25 | 30 days supply | Qty: 60 | Fill #3

## 2017-09-19 MED FILL — ATORVASTATIN 80 MG TABLET: 80 | 30 days supply | Qty: 30 | Fill #3

## 2018-12-12 ENCOUNTER — Emergency Department (HOSPITAL_BASED_OUTPATIENT_CLINIC_OR_DEPARTMENT_OTHER)
Admission: EM | Admit: 2018-12-12 | Discharge: 2018-12-12 | Disposition: A | Discharge status: Home / Self Care | Payer: Self-pay | Attending: Emergency Medicine | Admitting: Emergency Medicine

## 2018-12-12 ENCOUNTER — Other Ambulatory Visit: Payer: Self-pay

## 2018-12-12 ENCOUNTER — Emergency Department (HOSPITAL_BASED_OUTPATIENT_CLINIC_OR_DEPARTMENT_OTHER): Payer: Self-pay

## 2018-12-12 ENCOUNTER — Encounter (HOSPITAL_BASED_OUTPATIENT_CLINIC_OR_DEPARTMENT_OTHER): Payer: Self-pay | Admitting: Emergency Medicine

## 2018-12-12 DIAGNOSIS — F17210 Nicotine dependence, cigarettes, uncomplicated: Secondary | ICD-10-CM | POA: Insufficient documentation

## 2018-12-12 DIAGNOSIS — J45909 Unspecified asthma, uncomplicated: Secondary | ICD-10-CM | POA: Insufficient documentation

## 2018-12-12 DIAGNOSIS — I25110 Atherosclerotic heart disease of native coronary artery with unstable angina pectoris: Secondary | ICD-10-CM | POA: Insufficient documentation

## 2018-12-12 DIAGNOSIS — Z7901 Long term (current) use of anticoagulants: Secondary | ICD-10-CM | POA: Insufficient documentation

## 2018-12-12 DIAGNOSIS — Z7982 Long term (current) use of aspirin: Secondary | ICD-10-CM | POA: Insufficient documentation

## 2018-12-12 DIAGNOSIS — R0789 Other chest pain: Secondary | ICD-10-CM | POA: Insufficient documentation

## 2018-12-12 DIAGNOSIS — I10 Essential (primary) hypertension: Secondary | ICD-10-CM | POA: Insufficient documentation

## 2018-12-12 DIAGNOSIS — Z79899 Other long term (current) drug therapy: Secondary | ICD-10-CM | POA: Insufficient documentation

## 2018-12-12 HISTORY — DX: Pure hypercholesterolemia, unspecified: E78.00

## 2018-12-12 HISTORY — DX: Essential (primary) hypertension: I10

## 2018-12-12 LAB — CBC
HCT: 49.5 % (ref 39.0–52.0)
Hemoglobin: 15.8 g/dL (ref 13.0–17.0)
MCH: 28.5 pg (ref 26.0–34.0)
MCHC: 31.9 g/dL (ref 30.0–36.0)
MCV: 89.2 fL (ref 80.0–100.0)
PLATELETS: 283 10*3/uL (ref 150–400)
RBC: 5.55 MIL/uL (ref 4.22–5.81)
RDW: 13.9 % (ref 11.5–15.5)
WBC: 9.5 10*3/uL (ref 4.0–10.5)
nRBC: 0 % (ref 0.0–0.2)

## 2018-12-12 LAB — BASIC METABOLIC PANEL
Anion gap: 9 (ref 5–15)
BUN: 10 mg/dL (ref 6–20)
CALCIUM: 9.1 mg/dL (ref 8.9–10.3)
CO2: 23 mmol/L (ref 22–32)
CREATININE: 1.03 mg/dL (ref 0.61–1.24)
Chloride: 104 mmol/L (ref 98–111)
GFR calc Af Amer: >60 mL/min (ref >60)
GFR calc non Af Amer: >60 mL/min (ref >60)
GLUCOSE: 127 mg/dL — AB (ref 70–99)
Potassium: 3.7 mmol/L (ref 3.5–5.1)
Sodium: 136 mmol/L (ref 135–145)

## 2018-12-12 LAB — TROPONIN I
Troponin I: <0.03 ng/mL (ref <0.03)
Troponin I: <0.03 ng/mL (ref <0.03)

## 2018-12-12 MED ORDER — CARVEDILOL 6.25 MG PO TABS: 6.2500 mg | ORAL_TABLET | Freq: Two times a day (BID) | ORAL | 0 refills | Status: DC

## 2018-12-12 MED ORDER — ATORVASTATIN CALCIUM 80 MG PO TABS: 80.0000 mg | ORAL_TABLET | Freq: Every day | ORAL | 0 refills | Status: DC

## 2018-12-12 MED ORDER — AMLODIPINE BESYLATE 5 MG PO TABS: 5.0000 mg | ORAL_TABLET | Freq: Every day | ORAL | 0 refills | Status: DC

## 2018-12-12 MED ORDER — NITROGLYCERIN 0.4 MG SL SUBL: 0.4000 mg | SUBLINGUAL_TABLET | SUBLINGUAL | 0 refills | Status: DC | PRN

## 2018-12-12 MED ORDER — OMEPRAZOLE 20 MG PO CPDR: 20.0000 mg | DELAYED_RELEASE_CAPSULE | Freq: Every day | ORAL | 0 refills | Status: DC

## 2018-12-12 MED ORDER — LISINOPRIL 10 MG PO TABS: 10.0000 mg | ORAL_TABLET | Freq: Every day | ORAL | 0 refills | Status: DC

## 2018-12-12 MED ORDER — CLOPIDOGREL BISULFATE 75 MG PO TABS: 75.0000 mg | ORAL_TABLET | Freq: Every day | ORAL | 0 refills | Status: DC

## 2018-12-12 NOTE — ED Triage Notes (Signed)
Intermittent chest pain and headache x 2 days.

## 2018-12-12 NOTE — ED Notes (Signed)
Pt reports he has not been compliant with medication due to long wait times for PCP. Pt voiced concerns of his BP, associated HA and intermittent chest pain. Denies chest pain on assessment. Pt has hx of cardiac cath. Pt smoker.

## 2018-12-12 NOTE — ED Notes (Signed)
Pt informed of delay. Ambulatory to [facility].

## 2018-12-13 NOTE — ED Provider Notes (Signed)
[facility] EMERGENCY DEPARTMENT Provider Note   CSN: [id number] Arrival date & time: 12/12/18  1625     History   Chief Complaint Chief Complaint  Patient presents with  . Chest Pain  . Headache    HPI Mike Frazier is a 37 y.o. male.  HPI Patient states he has been out of his medications for several weeks.  He has had episodic generalized headaches and chest pain.  He states the chest pain is worse with trying to eat or drink.  The pain is not similar to previous pain which required stenting.  Currently he denies any chest pain.  He denies headache.  No recent fever or chills.  No shortness of breath.  No new lower extremity swelling or pain.  No focal weakness or numbness. Past Medical History:  Diagnosis Date  . Abscess of finger   . Asthma   . Coronary artery disease   . High cholesterol   . Hypertension   . Torsion, testicular     Patient Active Problem List   Diagnosis Date Noted  . Hyperlipidemia 12/12/2016  . Unstable angina (HCC) 10/01/2016  . Status post coronary artery stent placement   . NSTEMI (non-ST elevated myocardial infarction) (HCC) 09/30/2016  . Precordial pain   . Essential hypertension 09/28/2016  . Hypokalemia 09/28/2016  . Felon 10/21/2012    Past Surgical History:  Procedure Laterality Date  . CARDIAC CATHETERIZATION N/A 09/30/2016   Procedure: Left Heart Cath and Coronary Angiography;  Surgeon: Lyn Records, MD;  Location: [facility] INVASIVE CV LAB;  Service: Cardiovascular;  Laterality: N/A;  . CARDIAC CATHETERIZATION N/A 09/30/2016   Procedure: Intravascular Pressure Wire/FFR Study;  Surgeon: Lyn Records, MD;  Location: [facility] INVASIVE CV LAB;  Service: Cardiovascular;  Laterality: N/A;  . CARDIAC CATHETERIZATION N/A 09/30/2016   Procedure: Coronary Stent Intervention;  Surgeon: Lyn Records, MD;  Location: [facility] INVASIVE CV LAB;  Service: Cardiovascular;  Laterality: N/A;  . CORONARY STENT INTERVENTION    . I&D EXTREMITY  10/21/2012     Procedure: IRRIGATION AND DEBRIDEMENT EXTREMITY;  Surgeon: Marlowe Shores, MD;  Location: [facility] OR;  Service: Orthopedics;  Laterality: Left;  . INCISION / DRAINAGE HAND / FINGER    . TESTICLE TORSION REDUCTION          Home Medications    Prior to Admission medications   Medication Sig Start Date End Date Taking? Authorizing Provider  albuterol (PROVENTIL HFA;VENTOLIN HFA) 108 (90 Base) MCG/ACT inhaler Inhale 2 puffs into the lungs every 6 (six) hours as needed for wheezing or shortness of breath (asthma).    [provider]  amLODipine (NORVASC) 5 MG tablet Take 1 tablet (5 mg total) by mouth daily. 12/12/18   Loren Racer, MD  aspirin 81 MG EC tablet Take 1 tablet (81 mg total) by mouth daily. 10/07/16   Vivianne Master, PA-C  atorvastatin (LIPITOR) 80 MG tablet Take 1 tablet (80 mg total) by mouth daily at 6 PM. 12/12/18   Loren Racer, MD  carvedilol (COREG) 6.25 MG tablet Take 1 tablet (6.25 mg total) by mouth 2 (two) times daily with a meal. 12/12/18   Loren Racer, MD  clopidogrel (PLAVIX) 75 MG tablet Take 1 tablet (75 mg total) by mouth daily. 12/12/18   Loren Racer, MD  ibuprofen (ADVIL,MOTRIN) 200 MG tablet Take 800 mg by mouth 2 (two) times daily as needed for headache (pain).    [provider]  lisinopril (PRINIVIL,ZESTRIL) 10 MG tablet Take  1 tablet (10 mg total) by mouth daily. 12/12/18   Loren Racer, MD  methocarbamol (ROBAXIN) 500 MG tablet Take 1 tablet (500 mg total) by mouth 2 (two) times daily. 06/18/17   Kirichenko, Tatyana, PA-C  naproxen (NAPROSYN) 500 MG tablet Take 1 tablet (500 mg total) by mouth 2 (two) times daily. 06/18/17   Kirichenko, Lemont Fillers, PA-C  nitroGLYCERIN (NITROSTAT) 0.4 MG SL tablet Place 1 tablet (0.4 mg total) under the tongue every 5 (five) minutes as needed for chest pain. 12/12/18   Loren Racer, MD  omeprazole (PRILOSEC) 20 MG capsule Take 1 capsule (20 mg total) by mouth daily. 12/12/18   Loren Racer, MD  traMADol (ULTRAM) 50 MG tablet Take 1 tablet (50 mg total) by mouth every 6 (six) hours as needed. 06/18/17   Jaynie Crumble, PA-C    Family History Family History  Problem Relation Age of Onset  . Hypertension Mother   . Heart attack Sister        2 MI before age 51  . Hypertension Maternal Grandmother     Social History Social History   Tobacco Use  . Smoking status: Current Some Day Smoker    Packs/day: 0.00    Types: Cigarettes  . Smokeless tobacco: Never Used  Substance Use Topics  . Alcohol use: Yes    Comment: occ  . Drug use: Yes    Types: Marijuana     Allergies   Codeine   Review of Systems Review of Systems  Constitutional: Negative for chills and fever.  HENT: Negative for sore throat and trouble swallowing.   Eyes: Negative for photophobia and visual disturbance.  Respiratory: Negative for cough and shortness of breath.   Cardiovascular: Positive for chest pain. Negative for palpitations and leg swelling.  Gastrointestinal: Negative for abdominal pain, constipation, diarrhea, nausea and vomiting.  Musculoskeletal: Negative for back pain, myalgias, neck pain and neck stiffness.  Skin: Negative for rash and wound.  Neurological: Negative for dizziness, weakness, light-headedness, numbness and headaches.  All other systems reviewed and are negative.    Physical Exam Updated Vital Signs BP (!) 162/105 (BP Location: Right Arm)   Pulse 61   Temp 98.3 F (36.8 C) (Oral)   Resp 18   Ht 5' 9.5" (1.765 m)   Wt 104.3 kg   SpO2 99%   BMI 33.48 kg/m   Physical Exam Vitals signs and nursing note reviewed.  Constitutional:      General: He is not in acute distress.    Appearance: Normal appearance. He is well-developed. He is not ill-appearing.  HENT:     Head: Normocephalic and atraumatic.     Nose: Nose normal.     Mouth/Throat:     Mouth: Mucous membranes are moist.     Pharynx: No oropharyngeal exudate or posterior oropharyngeal  erythema.  Eyes:     Extraocular Movements: Extraocular movements intact.     Pupils: Pupils are equal, round, and reactive to light.  Neck:     Musculoskeletal: Normal range of motion and neck supple. No neck rigidity or muscular tenderness.  Cardiovascular:     Rate and Rhythm: Normal rate and regular rhythm.     Heart sounds: No murmur. No friction rub. No gallop.   Pulmonary:     Effort: Pulmonary effort is normal. No respiratory distress.     Breath sounds: Normal breath sounds. No stridor. No wheezing, rhonchi or rales.  Chest:     Chest wall: No tenderness.  Abdominal:  General: Bowel sounds are normal. There is no distension.     Palpations: Abdomen is soft.     Tenderness: There is no abdominal tenderness. There is no guarding or rebound.  Musculoskeletal: Normal range of motion.        General: No tenderness.     Comments: No midline thoracic or lumbar tenderness.  No CVA tenderness.  No lower extremity swelling, asymmetry or tenderness.  Distal pulses are 2+.  Lymphadenopathy:     Cervical: No cervical adenopathy.  Skin:    General: Skin is warm and dry.     Capillary Refill: Capillary refill takes less than 2 seconds.     Findings: No erythema or rash.  Neurological:     General: No focal deficit present.     Mental Status: He is alert and oriented to person, place, and time.     Comments: Moving all extremities without focal deficit.  Sensation fully intact.  Psychiatric:        Mood and Affect: Mood normal.        Behavior: Behavior normal.      ED Treatments / Results  Labs (all labs ordered are listed, but only abnormal results are displayed) Labs Reviewed  BASIC METABOLIC PANEL - Abnormal; Notable for the following components:      Result Value   Glucose, Bld 127 (*)    All other components within normal limits  CBC  TROPONIN I  TROPONIN I    EKG None  Radiology Dg Chest 2 View  Result Date: 12/12/2018 CLINICAL DATA:  Chest pain and  headache. EXAM: CHEST - 2 VIEW COMPARISON:  September 27, 2016 FINDINGS: The heart size and mediastinal contours are within normal limits. Both lungs are clear. The visualized skeletal structures are unremarkable. IMPRESSION: No active cardiopulmonary disease. Electronically Signed   By: Gerome Samavid  Williams III M.D   On: 12/12/2018 16:59    Procedures Procedures (including critical care time)  Medications Ordered in ED Medications - No data to display   Initial Impression / Assessment and Plan / ED Course  I have reviewed the triage vital signs and the nursing notes.  Pertinent labs & imaging results that were available during my care of the patient were reviewed by me and considered in my medical decision making (see chart for details).     Chest pain is very atypical.  Troponin x2 is normal.  Patient remains chest pain-free.  States pain is worse after eating or drinking.  He has been on PPI in the past.  Will start back on PPI.  Will give refill of his medications and urged close follow-up with cardiology.  Strict return precautions have been given.  Final Clinical Impressions(s) / ED Diagnoses   Final diagnoses:  Hypertension, unspecified type  Atypical chest pain    ED Discharge Orders         Ordered    amLODipine (NORVASC) 5 MG tablet  Daily     12/12/18 2343    atorvastatin (LIPITOR) 80 MG tablet  Daily-1800     12/12/18 2343    carvedilol (COREG) 6.25 MG tablet  2 times daily with meals     12/12/18 2343    clopidogrel (PLAVIX) 75 MG tablet  Daily     12/12/18 2343    lisinopril (PRINIVIL,ZESTRIL) 10 MG tablet  Daily     12/12/18 2343    nitroGLYCERIN (NITROSTAT) 0.4 MG SL tablet  Every 5 min PRN     12/12/18 2343  omeprazole (PRILOSEC) 20 MG capsule  Daily     12/12/18 2343           Loren Racer, MD 12/13/18 0040

## 2018-12-15 MED FILL — CLOPIDOGREL 75 MG TABLET: 75 | 30 days supply | Qty: 30 | Fill #0

## 2018-12-15 MED FILL — AMLODIPINE BESYLATE 5 MG TA: 5 | 30 days supply | Qty: 30 | Fill #0

## 2018-12-15 MED FILL — ATORVASTATIN 80 MG TABLET: 80 | 30 days supply | Qty: 30 | Fill #0

## 2018-12-15 MED FILL — LISINOPRIL 10 MG TABS: 10 | 30 days supply | Qty: 30 | Fill #0

## 2018-12-15 MED FILL — CARVEDILOL 6.25 MG TABLET: 6.25 | 30 days supply | Qty: 60 | Fill #0

## 2018-12-16 ENCOUNTER — Other Ambulatory Visit: Payer: Self-pay

## 2018-12-16 ENCOUNTER — Emergency Department (HOSPITAL_BASED_OUTPATIENT_CLINIC_OR_DEPARTMENT_OTHER): Payer: Self-pay

## 2018-12-16 ENCOUNTER — Encounter (HOSPITAL_BASED_OUTPATIENT_CLINIC_OR_DEPARTMENT_OTHER): Payer: Self-pay | Admitting: Emergency Medicine

## 2018-12-16 ENCOUNTER — Inpatient Hospital Stay (HOSPITAL_BASED_OUTPATIENT_CLINIC_OR_DEPARTMENT_OTHER)
Admission: EM | Admit: 2018-12-16 | Discharge: 2018-12-18 | DRG: 246 | Disposition: A | Discharge status: Home / Self Care | Payer: Self-pay | Attending: Cardiovascular Disease | Admitting: Cardiovascular Disease

## 2018-12-16 DIAGNOSIS — I252 Old myocardial infarction: Secondary | ICD-10-CM

## 2018-12-16 DIAGNOSIS — Z22322 Carrier or suspected carrier of Methicillin resistant Staphylococcus aureus: Secondary | ICD-10-CM

## 2018-12-16 DIAGNOSIS — F17210 Nicotine dependence, cigarettes, uncomplicated: Secondary | ICD-10-CM | POA: Diagnosis present

## 2018-12-16 DIAGNOSIS — Z79891 Long term (current) use of opiate analgesic: Secondary | ICD-10-CM

## 2018-12-16 DIAGNOSIS — I160 Hypertensive urgency: Secondary | ICD-10-CM | POA: Diagnosis present

## 2018-12-16 DIAGNOSIS — Z91120 Patient's intentional underdosing of medication regimen due to financial hardship: Secondary | ICD-10-CM

## 2018-12-16 DIAGNOSIS — Y831 Surgical operation with implant of artificial internal device as the cause of abnormal reaction of the patient, or of later complication, without mention of misadventure at the time of the procedure: Secondary | ICD-10-CM | POA: Diagnosis present

## 2018-12-16 DIAGNOSIS — T82855A Stenosis of coronary artery stent, initial encounter: Principal | ICD-10-CM | POA: Diagnosis present

## 2018-12-16 DIAGNOSIS — Z955 Presence of coronary angioplasty implant and graft: Secondary | ICD-10-CM

## 2018-12-16 DIAGNOSIS — Z8249 Family history of ischemic heart disease and other diseases of the circulatory system: Secondary | ICD-10-CM

## 2018-12-16 DIAGNOSIS — E785 Hyperlipidemia, unspecified: Secondary | ICD-10-CM | POA: Diagnosis present

## 2018-12-16 DIAGNOSIS — Z7982 Long term (current) use of aspirin: Secondary | ICD-10-CM

## 2018-12-16 DIAGNOSIS — I214 Non-ST elevation (NSTEMI) myocardial infarction: Secondary | ICD-10-CM | POA: Diagnosis present

## 2018-12-16 DIAGNOSIS — E669 Obesity, unspecified: Secondary | ICD-10-CM | POA: Diagnosis present

## 2018-12-16 DIAGNOSIS — R079 Chest pain, unspecified: Secondary | ICD-10-CM | POA: Diagnosis present

## 2018-12-16 DIAGNOSIS — Z885 Allergy status to narcotic agent status: Secondary | ICD-10-CM

## 2018-12-16 DIAGNOSIS — T45526A Underdosing of antithrombotic drugs, initial encounter: Secondary | ICD-10-CM | POA: Diagnosis present

## 2018-12-16 DIAGNOSIS — Z7902 Long term (current) use of antithrombotics/antiplatelets: Secondary | ICD-10-CM

## 2018-12-16 DIAGNOSIS — I2510 Atherosclerotic heart disease of native coronary artery without angina pectoris: Secondary | ICD-10-CM | POA: Diagnosis present

## 2018-12-16 DIAGNOSIS — Z79899 Other long term (current) drug therapy: Secondary | ICD-10-CM

## 2018-12-16 DIAGNOSIS — R072 Precordial pain: Secondary | ICD-10-CM

## 2018-12-16 DIAGNOSIS — Z6833 Body mass index (BMI) 33.0-33.9, adult: Secondary | ICD-10-CM

## 2018-12-16 DIAGNOSIS — Z9114 Patient's other noncompliance with medication regimen: Secondary | ICD-10-CM

## 2018-12-16 DIAGNOSIS — E7800 Pure hypercholesterolemia, unspecified: Secondary | ICD-10-CM | POA: Diagnosis present

## 2018-12-16 HISTORY — DX: Non-ST elevation (NSTEMI) myocardial infarction: I21.4

## 2018-12-16 HISTORY — DX: Migraine, unspecified, not intractable, without status migrainosus: G43.909

## 2018-12-16 HISTORY — DX: Patient's noncompliance with other medical treatment and regimen: Z91.19

## 2018-12-16 LAB — CBC WITH DIFFERENTIAL/PLATELET
Abs Immature Granulocytes: 0.06 10*3/uL (ref 0.00–0.07)
BASOS ABS: 0.1 10*3/uL (ref 0.0–0.1)
Basophils Relative: 1 %
Eosinophils Absolute: 0.3 10*3/uL (ref 0.0–0.5)
Eosinophils Relative: 3 %
HEMATOCRIT: 48 % (ref 39.0–52.0)
HEMOGLOBIN: 15 g/dL (ref 13.0–17.0)
IMMATURE GRANULOCYTES: 1 %
LYMPHS ABS: 5.5 10*3/uL — AB (ref 0.7–4.0)
LYMPHS PCT: 53 %
MCH: 27.9 pg (ref 26.0–34.0)
MCHC: 31.3 g/dL (ref 30.0–36.0)
MCV: 89.4 fL (ref 80.0–100.0)
Monocytes Absolute: 1.1 10*3/uL — ABNORMAL HIGH (ref 0.1–1.0)
Monocytes Relative: 11 %
NEUTROS ABS: 3.1 10*3/uL (ref 1.7–7.7)
NEUTROS PCT: 31 %
NRBC: 0 % (ref 0.0–0.2)
Platelets: 273 10*3/uL (ref 150–400)
RBC: 5.37 MIL/uL (ref 4.22–5.81)
RDW: 13.8 % (ref 11.5–15.5)
WBC: 10.1 10*3/uL (ref 4.0–10.5)

## 2018-12-16 LAB — HEPARIN LEVEL (UNFRACTIONATED): Heparin Unfractionated: 0.44 IU/mL (ref 0.30–0.70)

## 2018-12-16 LAB — COMPREHENSIVE METABOLIC PANEL
ALBUMIN: 4.3 g/dL (ref 3.5–5.0)
ALK PHOS: 51 U/L (ref 38–126)
ALT: 14 U/L (ref 0–44)
ANION GAP: 8 (ref 5–15)
AST: 18 U/L (ref 15–41)
BILIRUBIN TOTAL: 1 mg/dL (ref 0.3–1.2)
BUN: 12 mg/dL (ref 6–20)
CALCIUM: 8.8 mg/dL — AB (ref 8.9–10.3)
CO2: 27 mmol/L (ref 22–32)
Chloride: 99 mmol/L (ref 98–111)
Creatinine, Ser: 1.05 mg/dL (ref 0.61–1.24)
GFR calc Af Amer: >60 mL/min (ref >60)
GFR calc non Af Amer: >60 mL/min (ref >60)
GLUCOSE: 148 mg/dL — AB (ref 70–99)
POTASSIUM: 3.4 mmol/L — AB (ref 3.5–5.1)
SODIUM: 134 mmol/L — AB (ref 135–145)
TOTAL PROTEIN: 7.3 g/dL (ref 6.5–8.1)

## 2018-12-16 LAB — TROPONIN I
TROPONIN I: 0.25 ng/mL — AB (ref <0.03)
Troponin I: <0.03 ng/mL (ref <0.03)
Troponin I: 0.09 ng/mL (ref <0.03)

## 2018-12-16 LAB — D-DIMER, QUANTITATIVE (NOT AT ARMC)

## 2018-12-16 LAB — LIPID PANEL
Cholesterol: 168 mg/dL (ref 0–200)
HDL: 55 mg/dL (ref >40)
LDL Cholesterol: 79 mg/dL (ref 0–99)
Total CHOL/HDL Ratio: 3.1 RATIO
Triglycerides: 172 mg/dL — ABNORMAL HIGH (ref <150)
VLDL: 34 mg/dL (ref 0–40)

## 2018-12-16 LAB — LIPASE, BLOOD: Lipase: 41 U/L (ref 11–51)

## 2018-12-16 LAB — MRSA PCR SCREENING: MRSA by PCR: POSITIVE — AB

## 2018-12-16 MED ORDER — NITROGLYCERIN 0.4 MG SL SUBL: 0.4000 mg | SUBLINGUAL_TABLET | SUBLINGUAL | Status: DC | PRN

## 2018-12-16 MED ORDER — ALUM & MAG HYDROXIDE-SIMETH 200-200-20 MG/5ML PO SUSP
30.0000 mL | Freq: Once | ORAL | Status: AC
  Administered 2018-12-16: 30 mL via ORAL
  Filled 2018-12-16: qty 30

## 2018-12-16 MED ORDER — SODIUM CHLORIDE 0.9% FLUSH: 3.0000 mL | Freq: Two times a day (BID) | INTRAVENOUS | Status: DC

## 2018-12-16 MED ORDER — ASPIRIN 81 MG PO CHEW
81.0000 mg | CHEWABLE_TABLET | ORAL | Status: AC
  Administered 2018-12-17: 81 mg via ORAL
  Filled 2018-12-16: qty 1

## 2018-12-16 MED ORDER — AMLODIPINE BESYLATE 5 MG PO TABS
5.0000 mg | ORAL_TABLET | Freq: Every day | ORAL | Status: DC
  Administered 2018-12-16 – 2018-12-18 (×3): 5 mg via ORAL
  Filled 2018-12-16 (×3): qty 1

## 2018-12-16 MED ORDER — SODIUM CHLORIDE 0.9 % WEIGHT BASED INFUSION
3.0000 mL/kg/h | INTRAVENOUS | Status: DC
  Administered 2018-12-17: 3 mL/kg/h via INTRAVENOUS

## 2018-12-16 MED ORDER — HEPARIN (PORCINE) 25000 UT/250ML-% IV SOLN
1250.0000 [IU]/h | INTRAVENOUS | Status: DC
  Administered 2018-12-16 (×2): 1250 [IU]/h via INTRAVENOUS
  Filled 2018-12-16 (×2): qty 250

## 2018-12-16 MED ORDER — NITROGLYCERIN 0.4 MG SL SUBL
0.4000 mg | SUBLINGUAL_TABLET | SUBLINGUAL | Status: AC | PRN
  Administered 2018-12-16: 0.4 mg via SUBLINGUAL
  Filled 2018-12-16 (×3): qty 1

## 2018-12-16 MED ORDER — ASPIRIN EC 81 MG PO TBEC
81.0000 mg | DELAYED_RELEASE_TABLET | Freq: Every day | ORAL | Status: DC
  Administered 2018-12-16 – 2018-12-18 (×3): 81 mg via ORAL
  Filled 2018-12-16 (×3): qty 1

## 2018-12-16 MED ORDER — SODIUM CHLORIDE 0.9% FLUSH: 3.0000 mL | INTRAVENOUS | Status: DC | PRN

## 2018-12-16 MED ORDER — FENTANYL CITRATE (PF) 100 MCG/2ML IJ SOLN
INTRAMUSCULAR | Status: AC
  Filled 2018-12-16: qty 2

## 2018-12-16 MED ORDER — SODIUM CHLORIDE 0.9 % WEIGHT BASED INFUSION: 1.0000 mL/kg/h | INTRAVENOUS | Status: DC

## 2018-12-16 MED ORDER — IOPAMIDOL (ISOVUE-370) INJECTION 76%
100.0000 mL | Freq: Once | INTRAVENOUS | Status: AC | PRN
  Administered 2018-12-16: 100 mL via INTRAVENOUS

## 2018-12-16 MED ORDER — LIDOCAINE VISCOUS HCL 2 % MT SOLN
15.0000 mL | Freq: Once | OROMUCOSAL | Status: AC
  Administered 2018-12-16: 15 mL via ORAL
  Filled 2018-12-16: qty 15

## 2018-12-16 MED ORDER — FENTANYL CITRATE (PF) 100 MCG/2ML IJ SOLN
50.0000 ug | Freq: Once | INTRAMUSCULAR | Status: AC
  Administered 2018-12-16: 50 ug via INTRAVENOUS
  Filled 2018-12-16: qty 2

## 2018-12-16 MED ORDER — CARVEDILOL 3.125 MG PO TABS
6.2500 mg | ORAL_TABLET | Freq: Two times a day (BID) | ORAL | Status: DC
  Administered 2018-12-16 – 2018-12-18 (×3): 6.25 mg via ORAL
  Filled 2018-12-16: qty 1
  Filled 2018-12-16 (×2): qty 2
  Filled 2018-12-16: qty 1

## 2018-12-16 MED ORDER — ASPIRIN 81 MG PO CHEW
324.0000 mg | CHEWABLE_TABLET | Freq: Once | ORAL | Status: AC
  Administered 2018-12-16: 324 mg via ORAL
  Filled 2018-12-16: qty 4

## 2018-12-16 MED ORDER — ATORVASTATIN CALCIUM 80 MG PO TABS
80.0000 mg | ORAL_TABLET | Freq: Every day | ORAL | Status: DC
  Administered 2018-12-16 – 2018-12-17 (×2): 80 mg via ORAL
  Filled 2018-12-16 (×2): qty 1

## 2018-12-16 MED ORDER — HEPARIN BOLUS VIA INFUSION
4000.0000 [IU] | Freq: Once | INTRAVENOUS | Status: AC
  Administered 2018-12-16: 4000 [IU] via INTRAVENOUS

## 2018-12-16 MED ORDER — SODIUM CHLORIDE 0.9 % IV SOLN: 250.0000 mL | INTRAVENOUS | Status: DC | PRN

## 2018-12-16 MED ORDER — FENTANYL CITRATE (PF) 100 MCG/2ML IJ SOLN
50.0000 ug | Freq: Once | INTRAMUSCULAR | Status: AC
  Administered 2018-12-16: 50 ug via INTRAVENOUS

## 2018-12-16 NOTE — Consult Note (Addendum)
Cardiology Consultation:   Patient ID: Mike Frazier; [id number]; 07/16/1981   Admit date: 12/16/2018 Date of Consult: 12/16/2018  Primary Care Provider: Patient, No Pcp Per Primary Cardiologist: Dr. Verdis Prime, MD  Patient Profile:   Mike Frazier is a 37 y.o. male with a hx of CAD s/p NSTEMI with ramus lesion 100% stenosed and LAD 70 to 85% stenosed with FFR at 0.74 resulting in DES stent to LAD 09/30/2016, diastolic heart failure with LVEF noted to be 50%, history of tobacco use, hypertension and asthma who is being seen today for the evaluation of unstable angina at the request of Dr. Rush Landmark.  History of Present Illness:   Mike Frazier is a 37 year old male with a history stated above who presented to [facility] on 12/16/2018 with acute onset of chest pain with radiation to his back and left arm with intermittent numbness and tingling in his left hand. He cannot determine if the pain is exertional or not. He states that he was in the ED last night with his stepdaughter and returned home and began having chest pain similar to his prior NSTEMI in 2017. He reports taking one SL NTG with relief. His pain came back after approximately 10 minutes to a lesser degree and he therefore took another NTG with mild relief. Given this, he proceeded to [facility] for further evaluation. He states that initially his chest pain began several days ago however he describes this as more of a pressure or heaviness that would wax and wane. He denies palpitations, diaphoresis, SOB, nausea, vomiting, dizziness, LE swelling, orthopnea, or syncope. He states that he has not been smoking cigarettes, however has not been taking his medications for about 4 months now including his lisinopril, amlodipine, ASA or Plavix.   In the ED, initial EKG showed no ST changes however repeat EKG showed mild ST changes in V1.  Initial troponin negative, however subsequent delta troponin now at 0.09.  He is currently chest  pain-free with no recurrent pain since hospital admission.  CTA was performed given his initial symptoms which was negative for aortic abnormalities and PE.   Patient was last seen by his primary cardiologist, Dr. Katrinka Blazing 12/12/2016 in follow-up after his non-STEMI and stent placement.  At that time he was doing well and was noted to have difficulty obtaining/affording his medications.  He had stopped smoking and was  hypertensive while in the office with a BP in the 160's/100's, however had not had either of his antihypertensive medications including lisinopril and amlodipine.  Past Medical History:  Diagnosis Date  . Abscess of finger   . Asthma   . Coronary artery disease   . High cholesterol   . Hypertension   . NSTEMI (non-ST elevated myocardial infarction) (HCC)   . Torsion, testicular     Past Surgical History:  Procedure Laterality Date  . CARDIAC CATHETERIZATION N/A 09/30/2016   Procedure: Left Heart Cath and Coronary Angiography;  Surgeon: Lyn Records, MD;  Location: [facility] INVASIVE CV LAB;  Service: Cardiovascular;  Laterality: N/A;  . CARDIAC CATHETERIZATION N/A 09/30/2016   Procedure: Intravascular Pressure Wire/FFR Study;  Surgeon: Lyn Records, MD;  Location: [facility] INVASIVE CV LAB;  Service: Cardiovascular;  Laterality: N/A;  . CARDIAC CATHETERIZATION N/A 09/30/2016   Procedure: Coronary Stent Intervention;  Surgeon: Lyn Records, MD;  Location: [facility] INVASIVE CV LAB;  Service: Cardiovascular;  Laterality: N/A;  . CORONARY STENT INTERVENTION    . I&D EXTREMITY  10/21/2012   Procedure: IRRIGATION  AND DEBRIDEMENT EXTREMITY;  Surgeon: Marlowe Shores, MD;  Location: [facility] OR;  Service: Orthopedics;  Laterality: Left;  . INCISION / DRAINAGE HAND / FINGER    . TESTICLE TORSION REDUCTION       Prior to Admission medications   Medication Sig Start Date End Date Taking? Authorizing Provider  albuterol (PROVENTIL HFA;VENTOLIN HFA) 108 (90 Base) MCG/ACT inhaler Inhale 2 puffs into the  lungs every 6 (six) hours as needed for wheezing or shortness of breath (asthma).    [provider]  amLODipine (NORVASC) 5 MG tablet Take 1 tablet (5 mg total) by mouth daily. 12/12/18   Loren Racer, MD  aspirin 81 MG EC tablet Take 1 tablet (81 mg total) by mouth daily. 10/07/16   Vivianne Master, PA-C  atorvastatin (LIPITOR) 80 MG tablet Take 1 tablet (80 mg total) by mouth daily at 6 PM. 12/12/18   Loren Racer, MD  carvedilol (COREG) 6.25 MG tablet Take 1 tablet (6.25 mg total) by mouth 2 (two) times daily with a meal. 12/12/18   Loren Racer, MD  clopidogrel (PLAVIX) 75 MG tablet Take 1 tablet (75 mg total) by mouth daily. 12/12/18   Loren Racer, MD  ibuprofen (ADVIL,MOTRIN) 200 MG tablet Take 800 mg by mouth 2 (two) times daily as needed for headache (pain).    [provider]  lisinopril (PRINIVIL,ZESTRIL) 10 MG tablet Take 1 tablet (10 mg total) by mouth daily. 12/12/18   Loren Racer, MD  nitroGLYCERIN (NITROSTAT) 0.4 MG SL tablet Place 1 tablet (0.4 mg total) under the tongue every 5 (five) minutes as needed for chest pain. 12/12/18   Loren Racer, MD  omeprazole (PRILOSEC) 20 MG capsule Take 1 capsule (20 mg total) by mouth daily. 12/12/18   Loren Racer, MD    Inpatient Medications: Scheduled Meds:  Continuous Infusions: . heparin 1,250 Units/hr (12/16/18 0738)   PRN Meds:   Allergies:    Allergies  Allergen Reactions  . Codeine Shortness Of Breath    Social History:   Social History   Socioeconomic History  . Marital status: Single    Spouse name: Not on file  . Number of children: Not on file  . Years of education: Not on file  . Highest education level: Not on file  Occupational History  . Not on file  Social Needs  . Financial resource strain: Not on file  . Food insecurity:    Worry: Not on file    Inability: Not on file  . Transportation needs:    Medical: Not on file    Non-medical: Not on file  Tobacco  Use  . Smoking status: Current Some Day Smoker    Packs/day: 0.00    Types: Cigarettes  . Smokeless tobacco: Never Used  Substance and Sexual Activity  . Alcohol use: Yes    Comment: occ  . Drug use: Yes    Types: Marijuana  . Sexual activity: Not on file  Lifestyle  . Physical activity:    Days per week: Not on file    Minutes per session: Not on file  . Stress: Not on file  Relationships  . Social connections:    Talks on phone: Not on file    Gets together: Not on file    Attends religious service: Not on file    Active member of club or organization: Not on file    Attends meetings of clubs or organizations: Not on file    Relationship status: Not on file  .  Intimate partner violence:    Fear of current or ex partner: Not on file    Emotionally abused: Not on file    Physically abused: Not on file    Forced sexual activity: Not on file  Other Topics Concern  . Not on file  Social History Narrative  . Not on file    Family History:   Family History  Problem Relation Age of Onset  . Hypertension Mother   . Heart attack Sister        2 MI before age 75  . Hypertension Maternal Grandmother    Family Status:  Family Status  Relation Name Status  . Mother  Alive  . Father  Alive  . Sister  (Not Specified)  . MGM  Alive  . MGF  Alive  . PGM  Alive  . PGF  Alive    ROS:  Please see the history of present illness.  All other ROS reviewed and negative.     Physical Exam/Data:   Vitals:   12/16/18 1200 12/16/18 1230 12/16/18 1238 12/16/18 1300  BP: (!) 171/94 134/82 (!) 145/98   Pulse: 63 67 70   Resp: 12 14 18    Temp:    98.7 F (37.1 C)  TempSrc:    Oral  SpO2: 96% 95% 99%   Weight:      Height:        Intake/Output Summary (Last 24 hours) at 12/16/2018 1545 Last data filed at 12/16/2018 1044 Gross per 24 hour  Intake -  Output 1000 ml  Net -1000 ml   Filed Weights   12/16/18 0308  Weight: 104.3 kg   Body mass index is 33.96 kg/m.    General: Well developed, well nourished, NAD Skin: Warm, dry, intact  Head: Normocephalic, atraumatic, clear, moist mucus membranes. Neck: Negative for carotid bruits. No JVD Lungs:Clear to ausculation bilaterally. No wheezes, rales, or rhonchi. Breathing is unlabored. Cardiovascular: RRR with S1 S2. No murmurs, rubs, gallops, or LV heave appreciated. Abdomen: Soft, non-tender, non-distended with normoactive bowel sounds. No obvious abdominal masses. MSK: Strength and tone appear normal for age. 5/5 in all extremities Extremities: No edema. No clubbing or cyanosis. DP/PT pulses 2+ bilaterally Neuro: Alert and oriented. No focal deficits. No facial asymmetry. MAE spontaneously. Psych: Responds to questions appropriately with normal affect.     EKG:  The EKG was personally reviewed and demonstrates: 12/16/18 NSR with ST changes in V2.  Telemetry:  Telemetry was personally reviewed and demonstrates:  12/16/18 NSR HR 60-70's   Relevant CV Studies:  ECHO: 09/30/2016:  Study Conclusions  - Left ventricle: The cavity size was normal. Posterior wall   thickness was increased in a pattern of mild LVH. Systolic   function was vigorous. The estimated ejection fraction was in the   range of 65% to 70%. Wall motion was normal; there were no   regional wall motion abnormalities. Left ventricular diastolic   function parameters were normal. - Aortic valve: Transvalvular velocity was within the normal range.   There was no stenosis. There was no regurgitation. - Mitral valve: Transvalvular velocity was within the normal range.   There was no evidence for stenosis. There was no regurgitation. - Right ventricle: The cavity size was normal. Wall thickness was   normal. Systolic function was normal. - Tricuspid valve: There was no regurgitation.  CATH: 09/30/2016:   A stent was not successfully placed.  Lat Ramus lesion, 100 %stenosed.  Post intervention, there is a 99% residual  stenosis.  The left ventricular ejection fraction is 50-55% by visual estimate.  The left ventricular systolic function is normal.  LV end diastolic pressure is mildly elevated.  Mid LAD lesion, 75 %stenosed.  Post intervention, there is a 0% residual stenosis.  A STENT PROMUS PREM MR 2.75X16 drug eluting stent was successfully placed.    70-85% stenosis in the mid LAD with resting FFR of 0.74.  Successful drug-eluting stent implantation reducing the 85% stenosis to 0% with TIMI grade 3 flow.  Catheter-based embolic occlusion (thrombus) of a branch in the distal ramus intermedius. Once identified, the lesion was treated with wire probing which allowed TIMI grade 1-2 flow to occur. The vessel segment was too small to dilate with the balloon.  Normal right coronary and circumflex.  Normal left ventricular systolic function. EF greater than 50%.  RECOMMENDATIONS:   Aspirin and Brilinta  Angiomax 6 hours  Subcutaneous heparin for DVT prophylaxis  Hospitalization for at least an additional 24-36 hours.  Trend cardiac markers  Phase I into cardiac rehabilitation  Laboratory Data:  Chemistry Recent Labs  Lab 12/12/18 1645 12/16/18 0325  NA 136 134*  K 3.7 3.4*  CL 104 99  CO2 23 27  GLUCOSE 127* 148*  BUN 10 12  CREATININE 1.03 1.05  CALCIUM 9.1 8.8*  GFRNONAA >60 >60  GFRAA >60 >60  ANIONGAP 9 8    Total Protein  Date Value Ref Range Status  12/16/2018 7.3 6.5 - 8.1 g/dL Final   Albumin  Date Value Ref Range Status  12/16/2018 4.3 3.5 - 5.0 g/dL Final   AST  Date Value Ref Range Status  12/16/2018 18 15 - 41 U/L Final   ALT  Date Value Ref Range Status  12/16/2018 14 0 - 44 U/L Final   Alkaline Phosphatase  Date Value Ref Range Status  12/16/2018 51 38 - 126 U/L Final   Total Bilirubin  Date Value Ref Range Status  12/16/2018 1.0 0.3 - 1.2 mg/dL Final   Hematology Recent Labs  Lab 12/12/18 1645 12/16/18 0325  WBC 9.5 10.1  RBC 5.55  5.37  HGB 15.8 15.0  HCT 49.5 48.0  MCV 89.2 89.4  MCH 28.5 27.9  MCHC 31.9 31.3  RDW 13.9 13.8  PLT 283 273   Cardiac Enzymes Recent Labs  Lab 12/12/18 1635 12/12/18 1944 12/16/18 0325 12/16/18 0628  TROPONINI <0.03 <0.03 <0.03 0.09*   No results for input(s): TROPIPOC in the last 168 hours.  BNPNo results for input(s): BNP, PROBNP in the last 168 hours.  DDimer  Recent Labs  Lab 12/16/18 0325  DDIMER <0.27   TSH: No results found for: TSH Lipids: Lab Results  Component Value Date   CHOL 185 12/12/2016   HDL 57 12/12/2016   LDLCALC 107 (H) 12/12/2016   TRIG 105 12/12/2016   CHOLHDL 3.2 12/12/2016   HgbA1c: Lab Results  Component Value Date   HGBA1C 6.1 (H) 09/28/2016    Radiology/Studies:  Dg Chest 2 View  Result Date: 12/16/2018 CLINICAL DATA:  Left-sided chest pain tonight. EXAM: CHEST - 2 VIEW COMPARISON:  Radiographs 12/12/2018 FINDINGS: The cardiomediastinal contours are normal. The lungs are clear. Pulmonary vasculature is normal. No consolidation, pleural effusion, or pneumothorax. No acute osseous abnormalities are seen. IMPRESSION: No acute pulmonary process. Electronically Signed   By: Narda Rutherford M.D.   On: 12/16/2018 03:59   Dg Chest 2 View  Result Date: 12/12/2018 CLINICAL DATA:  Chest pain and headache. EXAM: CHEST - 2 VIEW  COMPARISON:  September 27, 2016 FINDINGS: The heart size and mediastinal contours are within normal limits. Both lungs are clear. The visualized skeletal structures are unremarkable. IMPRESSION: No active cardiopulmonary disease. Electronically Signed   By: Gerome Samavid  Williams III M.D   On: 12/12/2018 16:59   Ct Angio Chest/abd/pel For Dissection W And/or Wo Contrast  Result Date: 12/16/2018 CLINICAL DATA:  Severe sharp chest pain radiating to the back. Hypertension. EXAM: CT ANGIOGRAPHY CHEST, ABDOMEN AND PELVIS TECHNIQUE: Multidetector CT imaging through the chest, abdomen and pelvis was performed using the standard protocol  during bolus administration of intravenous contrast. Multiplanar reconstructed images and MIPs were obtained and reviewed to evaluate the vascular anatomy. CONTRAST:  100mL ISOVUE-370 IOPAMIDOL (ISOVUE-370) INJECTION 76% COMPARISON:  None. FINDINGS: CTA CHEST FINDINGS Cardiovascular: --Heart: The heart size is normal. There is nopericardial effusion. There is a left anterior descending coronary artery stent. --Aorta: The course and caliber of the thoracic aorta are normal. There is no aortic atherosclerotic calcification. Precontrast images show no aortic intramural hematoma. There is no blood pool, dissection or penetrating ulcer demonstrated on arterial phase postcontrast imaging. There is a conventional 3 vessel aortic arch branching pattern. The proximal arch vessels are widely patent. --Pulmonary Arteries: Contrast timing is optimized for preferential opacification of the aorta. Within that limitation, normal central pulmonary arteries. Mediastinum/Nodes: No mediastinal, hilar or axillary lymphadenopathy. The visualized thyroid and thoracic esophageal course are unremarkable. Lungs/Pleura: No pulmonary nodules or masses. No pleural effusion or pneumothorax. No focal airspace consolidation. No focal pleural abnormality. Musculoskeletal: No chest wall abnormality. No acute osseous findings. Review of the MIP images confirms the above findings. CTA ABDOMEN AND PELVIS FINDINGS VASCULAR Aorta: Normal caliber aorta without aneurysm, dissection, vasculitis or hemodynamically significant stenosis. There is no aortic atherosclerosis. Celiac: No aneurysm, dissection or hemodynamically significant stenosis. Normal branching pattern. SMA: Widely patent without dissection or stenosis. Renals: Single renal arteries bilaterally. No aneurysm, dissection, stenosis or evidence of fibromuscular dysplasia. IMA: Patent without abnormality. Inflow: No aneurysm, stenosis or dissection. Veins: Normal course and caliber of the major  veins. Assessment is otherwise limited by the arterial dominant contrast phase. Review of the MIP images confirms the above findings. NON-VASCULAR Hepatobiliary: Normal hepatic contours and density. No visible biliary dilatation. Normal gallbladder. Pancreas: Normal contours without ductal dilatation. No peripancreatic fluid collection. Spleen: Normal arterial phase splenic enhancement pattern. Adrenals/Urinary Tract: --Adrenal glands: Normal. --Right kidney/ureter: No hydronephrosis or perinephric stranding. No nephrolithiasis. No obstructing ureteral stones. --Left kidney/ureter: No hydronephrosis or perinephric stranding. No nephrolithiasis. No obstructing ureteral stones. --Urinary bladder: Unremarkable. Stomach/Bowel: --Stomach/Duodenum: No hiatal hernia or other gastric abnormality. Normal duodenal course and caliber. --Small bowel: No dilatation or inflammation. --Colon: No focal abnormality. --Appendix: Normal. Lymphatic: No abdominal or pelvic lymphadenopathy. Reproductive: Normal prostate and seminal vesicles. Musculoskeletal. No bony spinal canal stenosis or focal osseous abnormality. Other: None. Review of the MIP images confirms the above findings. . IMPRESSION: 1. No acute aortic syndrome. 2. No acute abnormality of the chest, abdomen or pelvis. Electronically Signed   By: Deatra RobinsonKevin  Herman M.D.   On: 12/16/2018 05:21   Assessment and Plan:   1.  Chest pain with a history of CAD s/p NSTEMI with PCI/DES to LAD 09/30/2016: -Patient presented with cute onset of chest pain which began earlier this morning with radiation to his left arm with numbness and tingling in his left hand.  He reports pain feels similar to his prior MI in 2017.  He states he has been having intermittent chest discomfort  described more as a pressure for the last several weeks however this acutely became worse earlier this morning. -He reports medication noncompliance with amlodipine, lisinopril, ASA and Plavix secondary to  affordability approximately 4 months duration -EKG initially looks similar to prior tracings however repeat EKG from 3:30 AM showed ST changes in V1.  Will repeat with stat EKG now for comparison -Initial troponin negative at <0.03 with repeat delta troponin at 0.09>> stat troponin sent with pending results -Patient is currently chest pain-free -Continue heparin gtt anticipation for cardiac catheterization for 12/17/2018 for further ischemic evaluation -Will resume home medications of ASA 81, atorvastatin 80, carvedilol 6.25, amlodipine 5  -Will hold lisinopril 10 in the setting of cath  -Keep NPO after midnight for cath tomorrow 12/17/18 -Creatinine, 1.05 today   2.  Hypertension: -Elevated, 145/98, 134/82, 171/94, 120/78 -Will resume home medications of carvedilol 6.25 mg twice daily, amlodipine 5 mg daily -Will hold lisinopril 10 mg in the setting of cath 12/17/18 -Continue to monitor, may need further medication titration  3.  HLD: -Will obtain lipid panel -Last LDL on 12/12/2016, 107 -Continue atorvastatin  For questions or updates, please contact [facility] Please consult [email or web address] for contact info under Cardiology/STEMI.   Raliegh Ip NP-C [facility] Pager: [phone] 12/16/2018 3:45 PM  Patient seen, examined. Available data reviewed. Agree with findings, assessment, and plan as outlined by Georgie Chard, NP-C.  On my exam the patient is an alert, oriented young man in no distress.  JVP is normal.  Carotid upstrokes are normal without bruits.  Heart is regular rate and rhythm with no murmur gallop.  Lungs are clear to auscultation bilaterally.  Abdomen is soft and nontender.  Extremities have no edema.  I have reviewed the patient's most recent EKG from 0313 today.  This demonstrates normal sinus rhythm with a nonspecific intraventricular conduction delay.  There is mild isolated ST elevation in lead V2 but no diagnostic changes of acute STEMI.  The  patient's troponin is elevated.  His symptoms are classic for ACS.  He also has had symptoms typical of hypertensive urgency with severe hypertension and headache noted.  He is now stable on IV heparin.  We will repeat an EKG and cycle enzymes.  With his known CAD, typical symptoms, EKG changes, and elevated troponin, it is clear that cardiac catheterization and possible PCI are indicated.  I have reviewed the risks, indications, and alternatives to cardiac catheterization, possible angioplasty, and stenting with the patient. Risks include but are not limited to bleeding, infection, vascular injury, stroke, myocardial infection, arrhythmia, kidney injury, radiation-related injury in the case of prolonged fluoroscopy use, emergency cardiac surgery, and death. The patient understands the risks of serious complication is 1-2 in 1000 with diagnostic cardiac cath and 1-2% or less with angioplasty/stenting.  We have also had a lengthy discussion about medication compliance and tobacco cessation.  The patient seems very motivated to start taking better care of himself, stop smoking, and start taking his medications regularly.  He was asked to notify nursing staff immediately if he has recurrent chest pain.  Tonny Bollman, M.D. 12/16/2018 6:16 PM

## 2018-12-16 NOTE — ED Notes (Signed)
Patient is resting comfortably. 

## 2018-12-16 NOTE — Progress Notes (Signed)
ANTICOAGULATION CONSULT NOTE  Pharmacy Consult for heparin Indication: chest pain/ACS  Allergies  Allergen Reactions  . Codeine Shortness Of Breath    Patient Measurements: Height: 5\' 9"  (175.3 cm) Weight: 230 lb (104.3 kg) IBW/kg (Calculated) : 70.7 Heparin Dosing Weight: 95kg  Vital Signs: Temp: 98.3 F (36.8 C) (12/18 1710) Temp Source: Oral (12/18 1710) BP: 146/88 (12/18 1710) Pulse Rate: 64 (12/18 1710)  Labs: Recent Labs    12/16/18 0325 12/16/18 0628 12/16/18 1554  HGB 15.0  --   --   HCT 48.0  --   --   PLT 273  --   --   HEPARINUNFRC  --   --  0.44  CREATININE 1.05  --   --   TROPONINI <0.03 0.09*  --     Estimated Creatinine Clearance: 112.4 mL/min (by C-G formula based on SCr of 1.05 mg/dL).   Medical History: Past Medical History:  Diagnosis Date  . Abscess of finger   . Asthma   . Coronary artery disease   . High cholesterol   . Hypertension   . NSTEMI (non-ST elevated myocardial infarction) (HCC)   . Torsion, testicular     Assessment: 37yo male w/ significant cardiac PMH c/o left-sided CP, took NTGx2 without relief, notes that he was off meds and restarted yesterday, initial troponin mildly elevated, to begin heparin.  Initial heparin level 0.44 on 1250 units/hr. No bleeding issues noted.   Goal of Therapy:  Heparin level 0.3-0.7 units/ml Monitor platelets by anticoagulation protocol: Yes   Plan:  Continue heparin at 1250 units/hr Labs in am Cath tomorrow 12/19  Sheppard CoilFrank Wilson PharmD., BCPS Clinical Pharmacist 12/16/2018 6:30 PM

## 2018-12-16 NOTE — ED Notes (Signed)
Date and time results received: 12/16/18 0704  Test: Troponin Critical Value: 0.09  Name of Provider Notified: Tegeler/Little  Orders Received? Or Actions Taken?: Actions Taken: EDP notified

## 2018-12-16 NOTE — Progress Notes (Signed)
ANTICOAGULATION CONSULT NOTE - Initial Consult  Pharmacy Consult for heparin Indication: chest pain/ACS  Allergies  Allergen Reactions  . Codeine Shortness Of Breath    Patient Measurements: Height: 5\' 9"  (175.3 cm) Weight: 230 lb (104.3 kg) IBW/kg (Calculated) : 70.7 Heparin Dosing Weight: 95kg  Vital Signs: Temp: 97.8 F (36.6 C) (12/18 0330) Temp Source: Oral (12/18 0330) BP: 123/85 (12/18 0600) Pulse Rate: 70 (12/18 0600)  Labs: Recent Labs    12/16/18 0325 12/16/18 0628  HGB 15.0  --   HCT 48.0  --   PLT 273  --   CREATININE 1.05  --   TROPONINI <0.03 0.09*    Estimated Creatinine Clearance: 112.4 mL/min (by C-G formula based on SCr of 1.05 mg/dL).   Medical History: Past Medical History:  Diagnosis Date  . Abscess of finger   . Asthma   . Coronary artery disease   . High cholesterol   . Hypertension   . NSTEMI (non-ST elevated myocardial infarction) (HCC)   . Torsion, testicular     Assessment: 37yo male w/ significant cardiac PMH c/o left-sided CP, took NTGx2 without relief, notes that he was off meds and restarted yesterday, initial troponin mildly elevated, to begin heparin.  Goal of Therapy:  Heparin level 0.3-0.7 units/ml Monitor platelets by anticoagulation protocol: Yes   Plan:  Will give heparin 4000 units IV bolus x1 followed by gtt at 1250 units/hr and monitor heparin levels and CBC.  Vernard GamblesVeronda Enjoli Tidd, PharmD, BCPS  12/16/2018,7:19 AM

## 2018-12-16 NOTE — ED Notes (Signed)
Patient transported to X-ray 

## 2018-12-16 NOTE — Progress Notes (Signed)
CRITICAL VALUE ALERT  Critical Value:  Troponin 0.25  Date & Time Notied:  12/16/18 2030  Provider Notified: Text page sent to Dr. Vonzella NippleWosik  Orders Received/Actions taken:   Pt assessed. Resting in room with visitor at bedside. Pt reports he is chest pain free.   2039 Vitals: T 99.90F BP 156/84. HR 65. O2 99% on room air.

## 2018-12-16 NOTE — ED Provider Notes (Signed)
[facility] EMERGENCY DEPARTMENT Provider Note   CSN: [id number] Arrival date & time: 12/16/18  0305     History   Chief Complaint Chief Complaint  Patient presents with  . Chest Pain    HPI Mike Frazier is a 37 y.o. male.  The history is provided by the patient, medical records and a significant other.  Chest Pain   This is a new problem. The current episode started less than 1 hour ago. The problem occurs constantly. The problem has not changed since onset.The pain is associated with exertion. The pain is present in the substernal region and lateral region. The pain is at a severity of 10/10. The pain is severe. The quality of the pain is described as exertional, pressure-like and sharp. The pain radiates to the left shoulder. Duration of episode(s) is 1 hour. The symptoms are aggravated by exertion. Pertinent negatives include no abdominal pain, no back pain, no cough, no diaphoresis, no fever, no headaches, no irregular heartbeat, no malaise/fatigue, no nausea, no numbness, no palpitations, no shortness of breath, no syncope and no vomiting. He has tried nothing for the symptoms. The treatment provided no relief. Risk factors include male gender.  His past medical history is significant for CAD.    Past Medical History:  Diagnosis Date  . Abscess of finger   . Asthma   . Coronary artery disease   . High cholesterol   . Hypertension   . Torsion, testicular     Patient Active Problem List   Diagnosis Date Noted  . Hyperlipidemia 12/12/2016  . Unstable angina (HCC) 10/01/2016  . Status post coronary artery stent placement   . NSTEMI (non-ST elevated myocardial infarction) (HCC) 09/30/2016  . Precordial pain   . Essential hypertension 09/28/2016  . Hypokalemia 09/28/2016  . Felon 10/21/2012    Past Surgical History:  Procedure Laterality Date  . CARDIAC CATHETERIZATION N/A 09/30/2016   Procedure: Left Heart Cath and Coronary Angiography;  Surgeon: Lyn Records, MD;  Location: [facility] INVASIVE CV LAB;  Service: Cardiovascular;  Laterality: N/A;  . CARDIAC CATHETERIZATION N/A 09/30/2016   Procedure: Intravascular Pressure Wire/FFR Study;  Surgeon: Lyn Records, MD;  Location: [facility] INVASIVE CV LAB;  Service: Cardiovascular;  Laterality: N/A;  . CARDIAC CATHETERIZATION N/A 09/30/2016   Procedure: Coronary Stent Intervention;  Surgeon: Lyn Records, MD;  Location: [facility] INVASIVE CV LAB;  Service: Cardiovascular;  Laterality: N/A;  . CORONARY STENT INTERVENTION    . I&D EXTREMITY  10/21/2012   Procedure: IRRIGATION AND DEBRIDEMENT EXTREMITY;  Surgeon: Marlowe Shores, MD;  Location: [facility] OR;  Service: Orthopedics;  Laterality: Left;  . INCISION / DRAINAGE HAND / FINGER    . TESTICLE TORSION REDUCTION          Home Medications    Prior to Admission medications   Medication Sig Start Date End Date Taking? Authorizing Provider  albuterol (PROVENTIL HFA;VENTOLIN HFA) 108 (90 Base) MCG/ACT inhaler Inhale 2 puffs into the lungs every 6 (six) hours as needed for wheezing or shortness of breath (asthma).    [provider]  amLODipine (NORVASC) 5 MG tablet Take 1 tablet (5 mg total) by mouth daily. 12/12/18   Loren Racer, MD  aspirin 81 MG EC tablet Take 1 tablet (81 mg total) by mouth daily. 10/07/16   Vivianne Master, PA-C  atorvastatin (LIPITOR) 80 MG tablet Take 1 tablet (80 mg total) by mouth daily at 6 PM. 12/12/18   Loren Racer, MD  carvedilol (  COREG) 6.25 MG tablet Take 1 tablet (6.25 mg total) by mouth 2 (two) times daily with a meal. 12/12/18   Loren Racer, MD  clopidogrel (PLAVIX) 75 MG tablet Take 1 tablet (75 mg total) by mouth daily. 12/12/18   Loren Racer, MD  ibuprofen (ADVIL,MOTRIN) 200 MG tablet Take 800 mg by mouth 2 (two) times daily as needed for headache (pain).    [provider]  lisinopril (PRINIVIL,ZESTRIL) 10 MG tablet Take 1 tablet (10 mg total) by mouth daily. 12/12/18   Loren Racer, MD    methocarbamol (ROBAXIN) 500 MG tablet Take 1 tablet (500 mg total) by mouth 2 (two) times daily. 06/18/17   Kirichenko, Tatyana, PA-C  naproxen (NAPROSYN) 500 MG tablet Take 1 tablet (500 mg total) by mouth 2 (two) times daily. 06/18/17   Kirichenko, Lemont Fillers, PA-C  nitroGLYCERIN (NITROSTAT) 0.4 MG SL tablet Place 1 tablet (0.4 mg total) under the tongue every 5 (five) minutes as needed for chest pain. 12/12/18   Loren Racer, MD  omeprazole (PRILOSEC) 20 MG capsule Take 1 capsule (20 mg total) by mouth daily. 12/12/18   Loren Racer, MD  traMADol (ULTRAM) 50 MG tablet Take 1 tablet (50 mg total) by mouth every 6 (six) hours as needed. 06/18/17   Jaynie Crumble, PA-C    Family History Family History  Problem Relation Age of Onset  . Hypertension Mother   . Heart attack Sister        2 MI before age 52  . Hypertension Maternal Grandmother     Social History Social History   Tobacco Use  . Smoking status: Current Some Day Smoker    Packs/day: 0.00    Types: Cigarettes  . Smokeless tobacco: Never Used  Substance Use Topics  . Alcohol use: Yes    Comment: occ  . Drug use: Yes    Types: Marijuana     Allergies   Codeine   Review of Systems Review of Systems  Constitutional: Negative for chills, diaphoresis, fatigue, fever and malaise/fatigue.  HENT: Negative for congestion.   Eyes: Negative for visual disturbance.  Respiratory: Negative for cough, choking, chest tightness, shortness of breath, wheezing and stridor.   Cardiovascular: Positive for chest pain. Negative for palpitations, leg swelling and syncope.  Gastrointestinal: Negative for abdominal pain, constipation, diarrhea, nausea and vomiting.  Genitourinary: Negative for flank pain.  Musculoskeletal: Negative for back pain, neck pain and neck stiffness.  Skin: Negative for rash and wound.  Neurological: Negative for light-headedness, numbness and headaches.  Psychiatric/Behavioral: Negative for agitation.   All other systems reviewed and are negative.    Physical Exam Updated Vital Signs BP (!) 160/102   Pulse 61   Temp 97.8 F (36.6 C) (Oral)   Resp 18   Ht 5\' 9"  (1.753 m)   Wt 104.3 kg   SpO2 98%   BMI 33.96 kg/m   Physical Exam Vitals signs and nursing note reviewed.  Constitutional:      General: He is not in acute distress.    Appearance: He is well-developed. He is not ill-appearing, toxic-appearing or diaphoretic.  HENT:     Head: Normocephalic and atraumatic.     Nose: No congestion or rhinorrhea.  Eyes:     Conjunctiva/sclera: Conjunctivae normal.     Pupils: Pupils are equal, round, and reactive to light.  Neck:     Musculoskeletal: Normal range of motion and neck supple.  Cardiovascular:     Rate and Rhythm: Normal rate and regular rhythm.  Pulses: Normal pulses.     Heart sounds: Normal heart sounds. No murmur. No systolic murmur.  Pulmonary:     Effort: Pulmonary effort is normal. No tachypnea or respiratory distress.     Breath sounds: Normal breath sounds. No stridor. No wheezing, rhonchi or rales.  Chest:     Chest wall: No tenderness.  Abdominal:     General: Abdomen is flat. There is no distension.     Palpations: Abdomen is soft.     Tenderness: There is no abdominal tenderness. There is no guarding or rebound.  Musculoskeletal:        General: No swelling or tenderness.     Right lower leg: He exhibits no tenderness.     Left lower leg: He exhibits no tenderness.  Skin:    General: Skin is warm and dry.  Neurological:     General: No focal deficit present.     Mental Status: He is alert.      ED Treatments / Results  Labs (all labs ordered are listed, but only abnormal results are displayed) Labs Reviewed  CBC WITH DIFFERENTIAL/PLATELET - Abnormal; Notable for the following components:      Result Value   Lymphs Abs 5.5 (*)    Monocytes Absolute 1.1 (*)    All other components within normal limits  COMPREHENSIVE METABOLIC PANEL -  Abnormal; Notable for the following components:   Sodium 134 (*)    Potassium 3.4 (*)    Glucose, Bld 148 (*)    Calcium 8.8 (*)    All other components within normal limits  TROPONIN I  LIPASE, BLOOD  D-DIMER, QUANTITATIVE (NOT AT [facility])  TROPONIN I    EKG EKG Interpretation  Date/Time:  Wednesday December 16 2018 03:13:48 EST Ventricular Rate:  61 PR Interval:    QRS Duration: 130 QT Interval:  429 QTC Calculation: 433 R Axis:   -41 Text Interpretation:  Sinus rhythm Probable left atrial enlargement IVCD, consider atypical RBBB Inferior infarct, old Borderline ST elevation, anterior leads When compared to prior, similar ST elevatiosn to prior.  No STEMI Confirmed by Theda Belfast ([id number]) on 12/16/2018 3:18:28 AM   Radiology Dg Chest 2 View  Result Date: 12/16/2018 CLINICAL DATA:  Left-sided chest pain tonight. EXAM: CHEST - 2 VIEW COMPARISON:  Radiographs 12/12/2018 FINDINGS: The cardiomediastinal contours are normal. The lungs are clear. Pulmonary vasculature is normal. No consolidation, pleural effusion, or pneumothorax. No acute osseous abnormalities are seen. IMPRESSION: No acute pulmonary process. Electronically Signed   By: Narda Rutherford M.D.   On: 12/16/2018 03:59   Ct Angio Chest/abd/pel For Dissection W And/or Wo Contrast  Result Date: 12/16/2018 CLINICAL DATA:  Severe sharp chest pain radiating to the back. Hypertension. EXAM: CT ANGIOGRAPHY CHEST, ABDOMEN AND PELVIS TECHNIQUE: Multidetector CT imaging through the chest, abdomen and pelvis was performed using the standard protocol during bolus administration of intravenous contrast. Multiplanar reconstructed images and MIPs were obtained and reviewed to evaluate the vascular anatomy. CONTRAST:  ISOVUE-370 IOPAMIDOL (ISOVUE-370) INJECTION 76% COMPARISON:  None. FINDINGS: CTA CHEST FINDINGS Cardiovascular: --Heart: The heart size is normal. There is nopericardial effusion. There is a left anterior descending  coronary artery stent. --Aorta: The course and caliber of the thoracic aorta are normal. There is no aortic atherosclerotic calcification. Precontrast images show no aortic intramural hematoma. There is no blood pool, dissection or penetrating ulcer demonstrated on arterial phase postcontrast imaging. There is a conventional 3 vessel aortic arch branching pattern. The proximal arch vessels  are widely patent. --Pulmonary Arteries: Contrast timing is optimized for preferential opacification of the aorta. Within that limitation, normal central pulmonary arteries. Mediastinum/Nodes: No mediastinal, hilar or axillary lymphadenopathy. The visualized thyroid and thoracic esophageal course are unremarkable. Lungs/Pleura: No pulmonary nodules or masses. No pleural effusion or pneumothorax. No focal airspace consolidation. No focal pleural abnormality. Musculoskeletal: No chest wall abnormality. No acute osseous findings. Review of the MIP images confirms the above findings. CTA ABDOMEN AND PELVIS FINDINGS VASCULAR Aorta: Normal caliber aorta without aneurysm, dissection, vasculitis or hemodynamically significant stenosis. There is no aortic atherosclerosis. Celiac: No aneurysm, dissection or hemodynamically significant stenosis. Normal branching pattern. SMA: Widely patent without dissection or stenosis. Renals: Single renal arteries bilaterally. No aneurysm, dissection, stenosis or evidence of fibromuscular dysplasia. IMA: Patent without abnormality. Inflow: No aneurysm, stenosis or dissection. Veins: Normal course and caliber of the major veins. Assessment is otherwise limited by the arterial dominant contrast phase. Review of the MIP images confirms the above findings. NON-VASCULAR Hepatobiliary: Normal hepatic contours and density. No visible biliary dilatation. Normal gallbladder. Pancreas: Normal contours without ductal dilatation. No peripancreatic fluid collection. Spleen: Normal arterial phase splenic enhancement  pattern. Adrenals/Urinary Tract: --Adrenal glands: Normal. --Right kidney/ureter: No hydronephrosis or perinephric stranding. No nephrolithiasis. No obstructing ureteral stones. --Left kidney/ureter: No hydronephrosis or perinephric stranding. No nephrolithiasis. No obstructing ureteral stones. --Urinary bladder: Unremarkable. Stomach/Bowel: --Stomach/Duodenum: No hiatal hernia or other gastric abnormality. Normal duodenal course and caliber. --Small bowel: No dilatation or inflammation. --Colon: No focal abnormality. --Appendix: Normal. Lymphatic: No abdominal or pelvic lymphadenopathy. Reproductive: Normal prostate and seminal vesicles. Musculoskeletal. No bony spinal canal stenosis or focal osseous abnormality. Other: None. Review of the MIP images confirms the above findings. . IMPRESSION: 1. No acute aortic syndrome. 2. No acute abnormality of the chest, abdomen or pelvis. Electronically Signed   By: Deatra RobinsonKevin  Herman M.D.   On: 12/16/2018 05:21    Procedures Procedures (including critical care time)  Medications Ordered in ED Medications  nitroGLYCERIN (NITROSTAT) SL tablet 0.4 mg (0.4 mg Sublingual Given 12/16/18 0333)  aspirin chewable tablet 324 mg (324 mg Oral Given 12/16/18 0332)  alum & mag hydroxide-simeth (MAALOX/MYLANTA) 200-200-20 MG/5ML suspension 30 mL (30 mLs Oral Given 12/16/18 0354)    And  lidocaine (XYLOCAINE) 2 % viscous mouth solution 15 mL (15 mLs Oral Given 12/16/18 0354)  iopamidol (ISOVUE-370) 76 % injection 100 mL (100 mLs Intravenous Contrast Given 12/16/18 0422)  fentaNYL (SUBLIMAZE) injection 50 mcg (50 mcg Intravenous Given 12/16/18 0534)     Initial Impression / Assessment and Plan / ED Course  I have reviewed the triage vital signs and the nursing notes.  Pertinent labs & imaging results that were available during my care of the patient were reviewed by me and considered in my medical decision making (see chart for details).     Mike Frazier is a 37 y.o.  male with a past medical history significant for CAD status post PCI, hypertension, hyperlipidemia, hypercholesterolemia, and asthma who presents with chest pain.  Patient reports that approximately 1 hour ago at 2 AM, he had sudden onset of pain in his left central chest.  He reports it radiates to his shoulder.  He reports it is sharp and constant.  He reports that it is similar in quality but worse in severity than it was when he had to have his stent put in 2 years ago.  He reports no associated shortness of breath or nausea or vomiting.  No significant diaphoresis.  He reports  no palpitations.  He says that he has not taken any of his blood pressure or other medicines for the last 6 months but was restarted on them several days ago.  He reports the pain is up to a 10 out of 10 in severity.  He has not taken aspirin today.  He denies recent trauma.  No abdominal pain, urinary symptoms or.  He reports some leg tingling in both legs yesterday.  Denies any leg symptoms at this time.  He reports his symptoms are worsened with exertion when he was walking around after the pain began.  Denies pleuritic pain.  He reports that he took nitroglycerin initially that helped his pain but then it returned.  He reports a second nitro did not help.  Initial EKG shows similar ST abnormalities to prior.  V2 is slightly more elevated than prior.  No STEMI seen.  Patient reports that he was seen last week for chest discomfort but he reports this feels completely different and this is more concerning to him.  He reports this feels more like his MI.  On exam, chest is nontender.  Patient has symmetric radial pulses.  Lungs are clear.  Back is nontender.  Abdomen is nontender.  Patient is not diaphoretic.  Patient is slightly hypertensive with blood pressure 165/100 on arrival.  Clinically I am concerned about a cardiac or pulmonary cause of his symptoms.  Patient will have troponin as well as d-dimer given the sudden onset  of sharp pain.  Patient will given aspirin and nitroglycerin.  He reports he has had shortness of breath symptoms with codeine in the past, will hold on opiates at this time.  Anticipate speaking with cardiology after work-up to determine disposition.  4:02 AM Patient reports his chest pain is still severe despite nitro and a GI cocktail.  He says the pain is now radiating directly through to his back.  Given his elevated blood pressure history and the pain going to his back, patient will have a dissection CT scan to further evaluate.  Patient has symmetric radial pulses and pedal pulses.  I still anticipate speaking with cardiology after CT and work-up.  5:28 AM CT scan shows no acute dissection PE or other intra-thoracic or abdominal abnormality.  Patient reports his pain is now a 6 out of 10 and eased off slightly.   Chart review shows that patient was able to tolerate fentanyl last time he was in the hospital.  This will be ordered.  Cardiology will be called for management recommendations.  5:44 AM Spoke with cardiology who agreed with admission to their service for further cardiac rule out.  They request that he be admitted to a stepdown/progressive bed, orders were placed.    Final Clinical Impressions(s) / ED Diagnoses   Final diagnoses:  Precordial pain    ED Discharge Orders    None      Clinical Impression: 1. Precordial pain     Disposition: Admit  This note was prepared with assistance of Dragon voice recognition software. Occasional wrong-word or sound-a-like substitutions may have occurred due to the inherent limitations of voice recognition software.     Zayn Selley, Canary Brim, MD 12/16/18 [phone]

## 2018-12-16 NOTE — Progress Notes (Signed)
Patient arrived the unit via ambulance, assessment completed see flow sheet, placed on tele ccmd notified, patient oriented to room and staff. Bed in lowest position, call bell within reach will continue to monitor.

## 2018-12-16 NOTE — H&P (View-Only) (Signed)
  Cardiology Consultation:   Patient ID: Mike Frazier; [id number]; 06/13/1981   Admit date: 12/16/2018 Date of Consult: 12/16/2018  Primary Care Provider: Patient, No Pcp Per Primary Cardiologist: Dr. Henry Smith, MD  Patient Profile:   Mike Frazier is a 37 y.o. male with a hx of CAD s/p NSTEMI with ramus lesion 100% stenosed and LAD 70 to 85% stenosed with FFR at 0.74 resulting in DES stent to LAD 09/30/2016, diastolic heart failure with LVEF noted to be 50%, history of tobacco use, hypertension and asthma who is being seen today for the evaluation of unstable angina at the request of Dr. Tegeler.  History of Present Illness:   Mike Frazier is a 37-year-old male with a history stated above who presented to [facility] on 12/16/2018 with acute onset of chest pain with radiation to his back and left arm with intermittent numbness and tingling in his left hand. He cannot determine if the pain is exertional or not. He states that he was in the ED last night with his stepdaughter and returned home and began having chest pain similar to his prior NSTEMI in 2017. He reports taking one SL NTG with relief. His pain came back after approximately 10 minutes to a lesser degree and he therefore took another NTG with mild relief. Given this, he proceeded to [facility] for further evaluation. He states that initially his chest pain began several days ago however he describes this as more of a pressure or heaviness that would wax and wane. He denies palpitations, diaphoresis, SOB, nausea, vomiting, dizziness, LE swelling, orthopnea, or syncope. He states that he has not been smoking cigarettes, however has not been taking his medications for about 4 months now including his lisinopril, amlodipine, ASA or Plavix.   In the ED, initial EKG showed no ST changes however repeat EKG showed mild ST changes in V1.  Initial troponin negative, however subsequent delta troponin now at 0.09.  He is currently chest  pain-free with no recurrent pain since hospital admission.  CTA was performed given his initial symptoms which was negative for aortic abnormalities and PE.   Patient was last seen by his primary cardiologist, Dr. Smith 12/12/2016 in follow-up after his non-STEMI and stent placement.  At that time he was doing well and was noted to have difficulty obtaining/affording his medications.  He had stopped smoking and was  hypertensive while in the office with a BP in the 160's/100's, however had not had either of his antihypertensive medications including lisinopril and amlodipine.  Past Medical History:  Diagnosis Date  . Abscess of finger   . Asthma   . Coronary artery disease   . High cholesterol   . Hypertension   . NSTEMI (non-ST elevated myocardial infarction) (HCC)   . Torsion, testicular     Past Surgical History:  Procedure Laterality Date  . CARDIAC CATHETERIZATION N/A 09/30/2016   Procedure: Left Heart Cath and Coronary Angiography;  Surgeon: Henry W Smith, MD;  Location: [facility]VASIVE CV LAB;  Service: Cardiovascular;  Laterality: N/A;  . CARDIAC CATHETERIZATION N/A 09/30/2016   Procedure: Intravascular Pressure Wire/FFR Study;  Surgeon: Henry W Smith, MD;  Location: [facility]VASIVE CV LAB;  Service: Cardiovascular;  Laterality: N/A;  . CARDIAC CATHETERIZATION N/A 09/30/2016   Procedure: Coronary Stent Intervention;  Surgeon: Henry W Smith, MD;  Location: [facility] INVASIVE CV LAB;  Service: Cardiovascular;  Laterality: N/A;  . CORONARY STENT INTERVENTION    . I&D EXTREMITY  10/21/2012   Procedure: IRRIGATION   AND DEBRIDEMENT EXTREMITY;  Surgeon: Matthew A Weingold, MD;  Location: [facility] OR;  Service: Orthopedics;  Laterality: Left;  . INCISION / DRAINAGE HAND / FINGER    . TESTICLE TORSION REDUCTION       Prior to Admission medications   Medication Sig Start Date End Date Taking? Authorizing Provider  albuterol (PROVENTIL HFA;VENTOLIN HFA) 108 (90 Base) MCG/ACT inhaler Inhale 2 puffs into the  lungs every 6 (six) hours as needed for wheezing or shortness of breath (asthma).    [provider]  amLODipine (NORVASC) 5 MG tablet Take 1 tablet (5 mg total) by mouth daily. 12/12/18   Yelverton, David, MD  aspirin 81 MG EC tablet Take 1 tablet (81 mg total) by mouth daily. 10/07/16   Noel, Tiffany S, PA-C  atorvastatin (LIPITOR) 80 MG tablet Take 1 tablet (80 mg total) by mouth daily at 6 PM. 12/12/18   Yelverton, David, MD  carvedilol (COREG) 6.25 MG tablet Take 1 tablet (6.25 mg total) by mouth 2 (two) times daily with a meal. 12/12/18   Yelverton, David, MD  clopidogrel (PLAVIX) 75 MG tablet Take 1 tablet (75 mg total) by mouth daily. 12/12/18   Yelverton, David, MD  ibuprofen (ADVIL,MOTRIN) 200 MG tablet Take 800 mg by mouth 2 (two) times daily as needed for headache (pain).    [provider]  lisinopril (PRINIVIL,ZESTRIL) 10 MG tablet Take 1 tablet (10 mg total) by mouth daily. 12/12/18   Yelverton, David, MD  nitroGLYCERIN (NITROSTAT) 0.4 MG SL tablet Place 1 tablet (0.4 mg total) under the tongue every 5 (five) minutes as needed for chest pain. 12/12/18   Yelverton, David, MD  omeprazole (PRILOSEC) 20 MG capsule Take 1 capsule (20 mg total) by mouth daily. 12/12/18   Yelverton, David, MD    Inpatient Medications: Scheduled Meds:  Continuous Infusions: . heparin 1,250 Units/hr (12/16/18 0738)   PRN Meds:   Allergies:    Allergies  Allergen Reactions  . Codeine Shortness Of Breath    Social History:   Social History   Socioeconomic History  . Marital status: Single    Spouse name: Not on file  . Number of children: Not on file  . Years of education: Not on file  . Highest education level: Not on file  Occupational History  . Not on file  Social Needs  . Financial resource strain: Not on file  . Food insecurity:    Worry: Not on file    Inability: Not on file  . Transportation needs:    Medical: Not on file    Non-medical: Not on file  Tobacco  Use  . Smoking status: Current Some Day Smoker    Packs/day: 0.00    Types: Cigarettes  . Smokeless tobacco: Never Used  Substance and Sexual Activity  . Alcohol use: Yes    Comment: occ  . Drug use: Yes    Types: Marijuana  . Sexual activity: Not on file  Lifestyle  . Physical activity:    Days per week: Not on file    Minutes per session: Not on file  . Stress: Not on file  Relationships  . Social connections:    Talks on phone: Not on file    Gets together: Not on file    Attends religious service: Not on file    Active member of club or organization: Not on file    Attends meetings of clubs or organizations: Not on file    Relationship status: Not on file  .   Intimate partner violence:    Fear of current or ex partner: Not on file    Emotionally abused: Not on file    Physically abused: Not on file    Forced sexual activity: Not on file  Other Topics Concern  . Not on file  Social History Narrative  . Not on file    Family History:   Family History  Problem Relation Age of Onset  . Hypertension Mother   . Heart attack Sister        2 MI before age 42  . Hypertension Maternal Grandmother    Family Status:  Family Status  Relation Name Status  . Mother  Alive  . Father  Alive  . Sister  (Not Specified)  . MGM  Alive  . MGF  Alive  . PGM  Alive  . PGF  Alive    ROS:  Please see the history of present illness.  All other ROS reviewed and negative.     Physical Exam/Data:   Vitals:   12/16/18 1200 12/16/18 1230 12/16/18 1238 12/16/18 1300  BP: (!) 171/94 134/82 (!) 145/98   Pulse: 63 67 70   Resp: 12 14 18   Temp:    98.7 F (37.1 C)  TempSrc:    Oral  SpO2: 96% 95% 99%   Weight:      Height:        Intake/Output Summary (Last 24 hours) at 12/16/2018 1545 Last data filed at 12/16/2018 1044 Gross per 24 hour  Intake -  Output 1000 ml  Net -1000 ml   Filed Weights   12/16/18 0308  Weight: 104.3 kg   Body mass index is 33.96 kg/m.    General: Well developed, well nourished, NAD Skin: Warm, dry, intact  Head: Normocephalic, atraumatic, clear, moist mucus membranes. Neck: Negative for carotid bruits. No JVD Lungs:Clear to ausculation bilaterally. No wheezes, rales, or rhonchi. Breathing is unlabored. Cardiovascular: RRR with S1 S2. No murmurs, rubs, gallops, or LV heave appreciated. Abdomen: Soft, non-tender, non-distended with normoactive bowel sounds. No obvious abdominal masses. MSK: Strength and tone appear normal for age. 5/5 in all extremities Extremities: No edema. No clubbing or cyanosis. DP/PT pulses 2+ bilaterally Neuro: Alert and oriented. No focal deficits. No facial asymmetry. MAE spontaneously. Psych: Responds to questions appropriately with normal affect.     EKG:  The EKG was personally reviewed and demonstrates: 12/16/18 NSR with ST changes in V2.  Telemetry:  Telemetry was personally reviewed and demonstrates:  12/16/18 NSR HR 60-70's   Relevant CV Studies:  ECHO: 09/30/2016:  Study Conclusions  - Left ventricle: The cavity size was normal. Posterior wall   thickness was increased in a pattern of mild LVH. Systolic   function was vigorous. The estimated ejection fraction was in the   range of 65% to 70%. Wall motion was normal; there were no   regional wall motion abnormalities. Left ventricular diastolic   function parameters were normal. - Aortic valve: Transvalvular velocity was within the normal range.   There was no stenosis. There was no regurgitation. - Mitral valve: Transvalvular velocity was within the normal range.   There was no evidence for stenosis. There was no regurgitation. - Right ventricle: The cavity size was normal. Wall thickness was   normal. Systolic function was normal. - Tricuspid valve: There was no regurgitation.  CATH: 09/30/2016:   A stent was not successfully placed.  Lat Ramus lesion, 100 %stenosed.  Post intervention, there is a 99% residual    stenosis.  The left ventricular ejection fraction is 50-55% by visual estimate.  The left ventricular systolic function is normal.  LV end diastolic pressure is mildly elevated.  Mid LAD lesion, 75 %stenosed.  Post intervention, there is a 0% residual stenosis.  A STENT PROMUS PREM MR 2.75X16 drug eluting stent was successfully placed.    70-85% stenosis in the mid LAD with resting FFR of 0.74.  Successful drug-eluting stent implantation reducing the 85% stenosis to 0% with TIMI grade 3 flow.  Catheter-based embolic occlusion (thrombus) of a branch in the distal ramus intermedius. Once identified, the lesion was treated with wire probing which allowed TIMI grade 1-2 flow to occur. The vessel segment was too small to dilate with the balloon.  Normal right coronary and circumflex.  Normal left ventricular systolic function. EF greater than 50%.  RECOMMENDATIONS:   Aspirin and Brilinta  Angiomax 6 hours  Subcutaneous heparin for DVT prophylaxis  Hospitalization for at least an additional 24-36 hours.  Trend cardiac markers  Phase I into cardiac rehabilitation  Laboratory Data:  Chemistry Recent Labs  Lab 12/12/18 1645 12/16/18 0325  NA 136 134*  K 3.7 3.4*  CL 104 99  CO2 23 27  GLUCOSE 127* 148*  BUN 10 12  CREATININE 1.03 1.05  CALCIUM 9.1 8.8*  GFRNONAA >60 >60  GFRAA >60 >60  ANIONGAP 9 8    Total Protein  Date Value Ref Range Status  12/16/2018 7.3 6.5 - 8.1 g/dL Final   Albumin  Date Value Ref Range Status  12/16/2018 4.3 3.5 - 5.0 g/dL Final   AST  Date Value Ref Range Status  12/16/2018 18 15 - 41 U/L Final   ALT  Date Value Ref Range Status  12/16/2018 14 0 - 44 U/L Final   Alkaline Phosphatase  Date Value Ref Range Status  12/16/2018 51 38 - 126 U/L Final   Total Bilirubin  Date Value Ref Range Status  12/16/2018 1.0 0.3 - 1.2 mg/dL Final   Hematology Recent Labs  Lab 12/12/18 1645 12/16/18 0325  WBC 9.5 10.1  RBC 5.55  5.37  HGB 15.8 15.0  HCT 49.5 48.0  MCV 89.2 89.4  [facility] 28.5 27.9  MCHC 31.9 31.3  RDW 13.9 13.8  PLT 283 273   Cardiac Enzymes Recent Labs  Lab 12/12/18 1635 12/12/18 1944 12/16/18 0325 12/16/18 0628  TROPONINI <0.03 <0.03 <0.03 0.09*   No results for input(s): TROPIPOC in the last 168 hours.  BNPNo results for input(s): BNP, PROBNP in the last 168 hours.  DDimer  Recent Labs  Lab 12/16/18 0325  DDIMER <0.27   TSH: No results found for: TSH Lipids: Lab Results  Component Value Date   CHOL 185 12/12/2016   HDL 57 12/12/2016   LDLCALC 107 (H) 12/12/2016   TRIG 105 12/12/2016   CHOLHDL 3.2 12/12/2016   HgbA1c: Lab Results  Component Value Date   HGBA1C 6.1 (H) 09/28/2016    Radiology/Studies:  Dg Chest 2 View  Result Date: 12/16/2018 CLINICAL DATA:  Left-sided chest pain tonight. EXAM: CHEST - 2 VIEW COMPARISON:  Radiographs 12/12/2018 FINDINGS: The cardiomediastinal contours are normal. The lungs are clear. Pulmonary vasculature is normal. No consolidation, pleural effusion, or pneumothorax. No acute osseous abnormalities are seen. IMPRESSION: No acute pulmonary process. Electronically Signed   By: Melanie  Sanford M.D.   On: 12/16/2018 03:59   Dg Chest 2 View  Result Date: 12/12/2018 CLINICAL DATA:  Chest pain and headache. EXAM: CHEST - 2 VIEW   COMPARISON:  September 27, 2016 FINDINGS: The heart size and mediastinal contours are within normal limits. Both lungs are clear. The visualized skeletal structures are unremarkable. IMPRESSION: No active cardiopulmonary disease. Electronically Signed   By: David  Williams III M.D   On: 12/12/2018 16:59   Ct Angio Chest/abd/pel For Dissection W And/or Wo Contrast  Result Date: 12/16/2018 CLINICAL DATA:  Severe sharp chest pain radiating to the back. Hypertension. EXAM: CT ANGIOGRAPHY CHEST, ABDOMEN AND PELVIS TECHNIQUE: Multidetector CT imaging through the chest, abdomen and pelvis was performed using the standard protocol  during bolus administration of intravenous contrast. Multiplanar reconstructed images and MIPs were obtained and reviewed to evaluate the vascular anatomy. CONTRAST:  100mL ISOVUE-370 IOPAMIDOL (ISOVUE-370) INJECTION 76% COMPARISON:  None. FINDINGS: CTA CHEST FINDINGS Cardiovascular: --Heart: The heart size is normal. There is nopericardial effusion. There is a left anterior descending coronary artery stent. --Aorta: The course and caliber of the thoracic aorta are normal. There is no aortic atherosclerotic calcification. Precontrast images show no aortic intramural hematoma. There is no blood pool, dissection or penetrating ulcer demonstrated on arterial phase postcontrast imaging. There is a conventional 3 vessel aortic arch branching pattern. The proximal arch vessels are widely patent. --Pulmonary Arteries: Contrast timing is optimized for preferential opacification of the aorta. Within that limitation, normal central pulmonary arteries. Mediastinum/Nodes: No mediastinal, hilar or axillary lymphadenopathy. The visualized thyroid and thoracic esophageal course are unremarkable. Lungs/Pleura: No pulmonary nodules or masses. No pleural effusion or pneumothorax. No focal airspace consolidation. No focal pleural abnormality. Musculoskeletal: No chest wall abnormality. No acute osseous findings. Review of the MIP images confirms the above findings. CTA ABDOMEN AND PELVIS FINDINGS VASCULAR Aorta: Normal caliber aorta without aneurysm, dissection, vasculitis or hemodynamically significant stenosis. There is no aortic atherosclerosis. Celiac: No aneurysm, dissection or hemodynamically significant stenosis. Normal branching pattern. SMA: Widely patent without dissection or stenosis. Renals: Single renal arteries bilaterally. No aneurysm, dissection, stenosis or evidence of fibromuscular dysplasia. IMA: Patent without abnormality. Inflow: No aneurysm, stenosis or dissection. Veins: Normal course and caliber of the major  veins. Assessment is otherwise limited by the arterial dominant contrast phase. Review of the MIP images confirms the above findings. NON-VASCULAR Hepatobiliary: Normal hepatic contours and density. No visible biliary dilatation. Normal gallbladder. Pancreas: Normal contours without ductal dilatation. No peripancreatic fluid collection. Spleen: Normal arterial phase splenic enhancement pattern. Adrenals/Urinary Tract: --Adrenal glands: Normal. --Right kidney/ureter: No hydronephrosis or perinephric stranding. No nephrolithiasis. No obstructing ureteral stones. --Left kidney/ureter: No hydronephrosis or perinephric stranding. No nephrolithiasis. No obstructing ureteral stones. --Urinary bladder: Unremarkable. Stomach/Bowel: --Stomach/Duodenum: No hiatal hernia or other gastric abnormality. Normal duodenal course and caliber. --Small bowel: No dilatation or inflammation. --Colon: No focal abnormality. --Appendix: Normal. Lymphatic: No abdominal or pelvic lymphadenopathy. Reproductive: Normal prostate and seminal vesicles. Musculoskeletal. No bony spinal canal stenosis or focal osseous abnormality. Other: None. Review of the MIP images confirms the above findings. . IMPRESSION: 1. No acute aortic syndrome. 2. No acute abnormality of the chest, abdomen or pelvis. Electronically Signed   By: Kevin  Herman M.D.   On: 12/16/2018 05:21   Assessment and Plan:   1.  Chest pain with a history of CAD s/p NSTEMI with PCI/DES to LAD 09/30/2016: -Patient presented with cute onset of chest pain which began earlier this morning with radiation to his left arm with numbness and tingling in his left hand.  He reports pain feels similar to his prior MI in 2017.  He states he has been having intermittent chest discomfort   described more as a pressure for the last several weeks however this acutely became worse earlier this morning. -He reports medication noncompliance with amlodipine, lisinopril, ASA and Plavix secondary to  affordability approximately 4 months duration -EKG initially looks similar to prior tracings however repeat EKG from 3:30 AM showed ST changes in V1.  Will repeat with stat EKG now for comparison -Initial troponin negative at <0.03 with repeat delta troponin at 0.09>> stat troponin sent with pending results -Patient is currently chest pain-free -Continue heparin gtt anticipation for cardiac catheterization for 12/17/2018 for further ischemic evaluation -Will resume home medications of ASA 81, atorvastatin 80, carvedilol 6.25, amlodipine 5  -Will hold lisinopril 10 in the setting of cath  -Keep NPO after midnight for cath tomorrow 12/17/18 -Creatinine, 1.05 today   2.  Hypertension: -Elevated, 145/98, 134/82, 171/94, 120/78 -Will resume home medications of carvedilol 6.25 mg twice daily, amlodipine 5 mg daily -Will hold lisinopril 10 mg in the setting of cath 12/17/18 -Continue to monitor, may need further medication titration  3.  HLD: -Will obtain lipid panel -Last LDL on 12/12/2016, 107 -Continue atorvastatin  For questions or updates, please contact [facility] Please consult [email or web address] for contact info under Cardiology/STEMI.   Signed, Jill McDaniel NP-C [facility] Pager: [phone] 12/16/2018 3:45 PM  Patient seen, examined. Available data reviewed. Agree with findings, assessment, and plan as outlined by Jill McDaniel, NP-C.  On my exam the patient is an alert, oriented young man in no distress.  JVP is normal.  Carotid upstrokes are normal without bruits.  Heart is regular rate and rhythm with no murmur gallop.  Lungs are clear to auscultation bilaterally.  Abdomen is soft and nontender.  Extremities have no edema.  I have reviewed the patient's most recent EKG from 0313 today.  This demonstrates normal sinus rhythm with a nonspecific intraventricular conduction delay.  There is mild isolated ST elevation in lead V2 but no diagnostic changes of acute STEMI.  The  patient's troponin is elevated.  His symptoms are classic for ACS.  He also has had symptoms typical of hypertensive urgency with severe hypertension and headache noted.  He is now stable on IV heparin.  We will repeat an EKG and cycle enzymes.  With his known CAD, typical symptoms, EKG changes, and elevated troponin, it is clear that cardiac catheterization and possible PCI are indicated.  I have reviewed the risks, indications, and alternatives to cardiac catheterization, possible angioplasty, and stenting with the patient. Risks include but are not limited to bleeding, infection, vascular injury, stroke, myocardial infection, arrhythmia, kidney injury, radiation-related injury in the case of prolonged fluoroscopy use, emergency cardiac surgery, and death. The patient understands the risks of serious complication is 1-2 in 1000 with diagnostic cardiac cath and 1-2% or less with angioplasty/stenting.  We have also had a lengthy discussion about medication compliance and tobacco cessation.  The patient seems very motivated to start taking better care of himself, stop smoking, and start taking his medications regularly.  He was asked to notify nursing staff immediately if he has recurrent chest pain.  Jaylie Neaves, M.D. 12/16/2018 6:16 PM     

## 2018-12-16 NOTE — ED Notes (Signed)
Patient transported to CT 

## 2018-12-16 NOTE — ED Triage Notes (Signed)
Pt c/o left sided chest pain, took 2 ntg at home without relief.

## 2018-12-16 NOTE — ED Notes (Signed)
Pt was off all his medications and just started back on meds yesterday.

## 2018-12-17 ENCOUNTER — Encounter (HOSPITAL_COMMUNITY)
Admission: EM | Disposition: A | Discharge status: Home / Self Care | Payer: Self-pay | Source: Home / Self Care | Attending: Cardiovascular Disease

## 2018-12-17 ENCOUNTER — Encounter (HOSPITAL_COMMUNITY): Payer: Self-pay | Admitting: General Practice

## 2018-12-17 ENCOUNTER — Other Ambulatory Visit: Payer: Self-pay

## 2018-12-17 DIAGNOSIS — I2510 Atherosclerotic heart disease of native coronary artery without angina pectoris: Secondary | ICD-10-CM

## 2018-12-17 HISTORY — PX: LEFT HEART CATH AND CORONARY ANGIOGRAPHY: CATH118249

## 2018-12-17 HISTORY — PX: CORONARY ANGIOPLASTY WITH STENT PLACEMENT: SHX49

## 2018-12-17 LAB — BASIC METABOLIC PANEL
Anion gap: 10 (ref 5–15)
BUN: 11 mg/dL (ref 6–20)
CO2: 24 mmol/L (ref 22–32)
Calcium: 8.7 mg/dL — ABNORMAL LOW (ref 8.9–10.3)
Chloride: 105 mmol/L (ref 98–111)
Creatinine, Ser: 1.13 mg/dL (ref 0.61–1.24)
GFR calc Af Amer: >60 mL/min (ref >60)
GLUCOSE: 95 mg/dL (ref 70–99)
Potassium: 4.3 mmol/L (ref 3.5–5.1)
Sodium: 139 mmol/L (ref 135–145)

## 2018-12-17 LAB — CBC
HCT: 45.7 % (ref 39.0–52.0)
Hemoglobin: 14.6 g/dL (ref 13.0–17.0)
MCH: 28 pg (ref 26.0–34.0)
MCHC: 31.9 g/dL (ref 30.0–36.0)
MCV: 87.5 fL (ref 80.0–100.0)
Platelets: 268 10*3/uL (ref 150–400)
RBC: 5.22 MIL/uL (ref 4.22–5.81)
RDW: 13.6 % (ref 11.5–15.5)
WBC: 9.5 10*3/uL (ref 4.0–10.5)
nRBC: 0 % (ref 0.0–0.2)

## 2018-12-17 LAB — HEPARIN LEVEL (UNFRACTIONATED): Heparin Unfractionated: 0.48 IU/mL (ref 0.30–0.70)

## 2018-12-17 LAB — POCT ACTIVATED CLOTTING TIME: Activated Clotting Time: 510 seconds

## 2018-12-17 SURGERY — LEFT HEART CATH AND CORONARY ANGIOGRAPHY
Anesthesia: LOCAL

## 2018-12-17 MED ORDER — LIDOCAINE HCL (PF) 1 % IJ SOLN
INTRAMUSCULAR | Status: DC | PRN
  Administered 2018-12-17: 2 mL via INTRADERMAL

## 2018-12-17 MED ORDER — HEPARIN SODIUM (PORCINE) 1000 UNIT/ML IJ SOLN
INTRAMUSCULAR | Status: DC | PRN
  Administered 2018-12-17: 5000 [IU] via INTRAVENOUS

## 2018-12-17 MED ORDER — THE SENSUOUS HEART BOOK
Freq: Once | Status: AC
  Administered 2018-12-17: 22:00:00
  Filled 2018-12-17: qty 1

## 2018-12-17 MED ORDER — TICAGRELOR 90 MG PO TABS
ORAL_TABLET | ORAL | Status: DC | PRN
  Administered 2018-12-17: 180 mg via ORAL

## 2018-12-17 MED ORDER — CHLORHEXIDINE GLUCONATE CLOTH 2 % EX PADS
6.0000 | MEDICATED_PAD | Freq: Every day | CUTANEOUS | Status: DC
  Administered 2018-12-18: 6 via TOPICAL

## 2018-12-17 MED ORDER — SODIUM CHLORIDE 0.9 % IV SOLN
INTRAVENOUS | Status: AC
  Administered 2018-12-17: 13:00:00 via INTRAVENOUS

## 2018-12-17 MED ORDER — BIVALIRUDIN TRIFLUOROACETATE 250 MG IV SOLR
INTRAVENOUS | Status: AC
  Filled 2018-12-17: qty 250

## 2018-12-17 MED ORDER — LABETALOL HCL 5 MG/ML IV SOLN: 10.0000 mg | INTRAVENOUS | Status: AC | PRN

## 2018-12-17 MED ORDER — TICAGRELOR 90 MG PO TABS
90.0000 mg | ORAL_TABLET | Freq: Two times a day (BID) | ORAL | Status: DC
  Administered 2018-12-17 – 2018-12-18 (×2): 90 mg via ORAL
  Filled 2018-12-17 (×2): qty 1

## 2018-12-17 MED ORDER — NITROGLYCERIN 1 MG/10 ML FOR IR/CATH LAB
INTRA_ARTERIAL | Status: AC
  Filled 2018-12-17: qty 10

## 2018-12-17 MED ORDER — HYDRALAZINE HCL 20 MG/ML IJ SOLN: 5.0000 mg | INTRAMUSCULAR | Status: AC | PRN

## 2018-12-17 MED ORDER — SODIUM CHLORIDE 0.9 % IV SOLN
1.7500 mg/kg/h | INTRAVENOUS | Status: AC
  Administered 2018-12-17: 13:00:00 1.75 mg/kg/h via INTRAVENOUS
  Filled 2018-12-17 (×2): qty 250

## 2018-12-17 MED ORDER — ACETAMINOPHEN 325 MG PO TABS: 650.0000 mg | ORAL_TABLET | ORAL | Status: DC | PRN

## 2018-12-17 MED ORDER — MUPIROCIN 2 % EX OINT
1.0000 "application " | TOPICAL_OINTMENT | Freq: Two times a day (BID) | CUTANEOUS | Status: DC
  Administered 2018-12-17 – 2018-12-18 (×2): 1 via NASAL
  Filled 2018-12-17: qty 22

## 2018-12-17 MED ORDER — HEPARIN SODIUM (PORCINE) 1000 UNIT/ML IJ SOLN
INTRAMUSCULAR | Status: AC
  Filled 2018-12-17: qty 1

## 2018-12-17 MED ORDER — LIDOCAINE HCL (PF) 1 % IJ SOLN
INTRAMUSCULAR | Status: AC
  Filled 2018-12-17: qty 30

## 2018-12-17 MED ORDER — VERAPAMIL HCL 2.5 MG/ML IV SOLN
INTRAVENOUS | Status: DC | PRN
  Administered 2018-12-17: 10 mL via INTRA_ARTERIAL

## 2018-12-17 MED ORDER — SODIUM CHLORIDE 0.9 % IV SOLN
INTRAVENOUS | Status: AC | PRN
  Administered 2018-12-17: 1.75 mg/kg/h via INTRAVENOUS
  Administered 2018-12-17: 12:00:00

## 2018-12-17 MED ORDER — SODIUM CHLORIDE 0.9 % IV SOLN: 250.0000 mL | INTRAVENOUS | Status: DC | PRN

## 2018-12-17 MED ORDER — HEART ATTACK BOUNCING BOOK
Freq: Once | Status: AC
  Administered 2018-12-17: 22:00:00
  Filled 2018-12-17: qty 1

## 2018-12-17 MED ORDER — ANGIOPLASTY BOOK
Freq: Once | Status: AC
  Administered 2018-12-17: 22:00:00
  Filled 2018-12-17: qty 1

## 2018-12-17 MED ORDER — MIDAZOLAM HCL 2 MG/2ML IJ SOLN
INTRAMUSCULAR | Status: AC
  Filled 2018-12-17: qty 2

## 2018-12-17 MED ORDER — BIVALIRUDIN BOLUS VIA INFUSION - CUPID
INTRAVENOUS | Status: DC | PRN
  Administered 2018-12-17: 78.225 mg via INTRAVENOUS

## 2018-12-17 MED ORDER — TICAGRELOR 90 MG PO TABS
ORAL_TABLET | ORAL | Status: AC
  Filled 2018-12-17: qty 2

## 2018-12-17 MED ORDER — SODIUM CHLORIDE 0.9% FLUSH
3.0000 mL | Freq: Two times a day (BID) | INTRAVENOUS | Status: DC
  Administered 2018-12-17: 3 mL via INTRAVENOUS

## 2018-12-17 MED ORDER — HEPARIN (PORCINE) IN NACL 1000-0.9 UT/500ML-% IV SOLN
INTRAVENOUS | Status: AC
  Filled 2018-12-17: qty 1000

## 2018-12-17 MED ORDER — FENTANYL CITRATE (PF) 100 MCG/2ML IJ SOLN
INTRAMUSCULAR | Status: DC | PRN
  Administered 2018-12-17: 25 ug via INTRAVENOUS

## 2018-12-17 MED ORDER — HEPARIN (PORCINE) IN NACL 1000-0.9 UT/500ML-% IV SOLN
INTRAVENOUS | Status: DC | PRN
  Administered 2018-12-17 (×2): 500 mL

## 2018-12-17 MED ORDER — FENTANYL CITRATE (PF) 100 MCG/2ML IJ SOLN
INTRAMUSCULAR | Status: AC
  Filled 2018-12-17: qty 2

## 2018-12-17 MED ORDER — IOHEXOL 350 MG/ML SOLN
INTRAVENOUS | Status: DC | PRN
  Administered 2018-12-17: 95 mL via INTRA_ARTERIAL

## 2018-12-17 MED ORDER — ONDANSETRON HCL 4 MG/2ML IJ SOLN: 4.0000 mg | Freq: Four times a day (QID) | INTRAMUSCULAR | Status: DC | PRN

## 2018-12-17 MED ORDER — MIDAZOLAM HCL 2 MG/2ML IJ SOLN
INTRAMUSCULAR | Status: DC | PRN
  Administered 2018-12-17: 1 mg via INTRAVENOUS

## 2018-12-17 MED ORDER — ASPIRIN 81 MG PO CHEW: 81.0000 mg | CHEWABLE_TABLET | Freq: Every day | ORAL | Status: DC

## 2018-12-17 MED ORDER — SODIUM CHLORIDE 0.9% FLUSH: 3.0000 mL | INTRAVENOUS | Status: DC | PRN

## 2018-12-17 SURGICAL SUPPLY — 20 items
BALLN SAPPHIRE 2.0X12 (BALLOONS) ×2
BALLN SAPPHIRE ~~LOC~~ 3.0X15 (BALLOONS) ×2 IMPLANT
BALLOON SAPPHIRE 2.0X12 (BALLOONS) ×1 IMPLANT
CATH INFINITI 5FR ANG PIGTAIL (CATHETERS) ×2 IMPLANT
CATH OPTITORQUE TIG 4.0 5F (CATHETERS) ×2 IMPLANT
CATH VISTA GUIDE 6FR XBLAD3.5 (CATHETERS) ×2 IMPLANT
DEVICE RAD COMP TR BAND LRG (VASCULAR PRODUCTS) ×2 IMPLANT
GLIDESHEATH SLEND A-KIT 6F 22G (SHEATH) ×2 IMPLANT
GLIDESHEATH SLEND SS 6F .021 (SHEATH) IMPLANT
GUIDEWIRE INQWIRE 1.5J.035X260 (WIRE) ×1 IMPLANT
INQWIRE 1.5J .035X260CM (WIRE) ×2
KIT ENCORE 26 ADVANTAGE (KITS) ×2 IMPLANT
KIT HEART LEFT (KITS) ×2 IMPLANT
PACK CARDIAC CATHETERIZATION (CUSTOM PROCEDURE TRAY) ×2 IMPLANT
STENT SYNERGY DES 2.75X24 (Permanent Stent) ×2 IMPLANT
SYR MEDRAD MARK 7 150ML (SYRINGE) ×2 IMPLANT
TRANSDUCER W/STOPCOCK (MISCELLANEOUS) ×2 IMPLANT
TUBING CIL FLEX 10 FLL-RA (TUBING) ×2 IMPLANT
WIRE ASAHI PROWATER 180CM (WIRE) ×2 IMPLANT
WIRE HI TORQ VERSACORE-J 145CM (WIRE) ×2 IMPLANT

## 2018-12-17 NOTE — Progress Notes (Signed)
Patient to cath lab at this time

## 2018-12-17 NOTE — Care Management Note (Signed)
Case Management Note  Patient Details  Name: Mike Frazier MRN: [id number] Date of Birth: 02/12/1981  Subjective/Objective:    From home, s/p stent, NCM gave patient Match Letter for med ast, [facility], and he states he will be on plavix, he states the MD should know he is on plavix. He does not have a brilinta card.                  Action/Plan: DC when ready.    Expected Discharge Date:                  Expected Discharge Plan:  Home/Self Care  In-House Referral:     Discharge planning Services  CM Consult, Indigent Health Clinic, Follow-up appt scheduled, MATCH Program, Medication Assistance  Post Acute Care Choice:    Choice offered to:     DME Arranged:    DME Agency:     HH Arranged:    HH Agency:     Status of Service:  Completed, signed off  If discussed at [employer] Meetings, dates discussed:    Additional Comments:  Mike Frazier, Mike Bearse Clinton, RN 12/17/2018, 5:21 PM

## 2018-12-17 NOTE — Interval H&P Note (Signed)
Cath Lab Visit (complete for each Cath Lab visit)  Clinical Evaluation Leading to the Procedure:   ACS: Yes.    Non-ACS:    Anginal Classification: CCS III  Anti-ischemic medical therapy: Maximal Therapy (2 or more classes of medications)  Non-Invasive Test Results: No non-invasive testing performed  Prior CABG: No previous CABG      History and Physical Interval Note:  12/17/2018 10:40 AM  Mike Frazier  has presented today for surgery, with the diagnosis of cp, elevated trop  The various methods of treatment have been discussed with the patient and family. After consideration of risks, benefits and other options for treatment, the patient has consented to  Procedure(s): LEFT HEART CATH AND CORONARY ANGIOGRAPHY (N/A) as a surgical intervention .  The patient's history has been reviewed, patient examined, no change in status, stable for surgery.  I have reviewed the patient's chart and labs.  Questions were answered to the patient's satisfaction.     Nanetta BattyJonathan Earnestine Shipp

## 2018-12-17 NOTE — Progress Notes (Addendum)
Progress Note  Patient Name: Mike Frazier Tax Date of Encounter: 12/17/2018  Primary Cardiologist: Lesleigh NoeHenry W Smith III, MD   Subjective   No significant overnight events. Patient has no complaints this morning. No chest pain or shortness of breath.   Inpatient Medications    Scheduled Meds: . amLODipine  5 mg Oral Daily  . aspirin EC  81 mg Oral Daily  . atorvastatin  80 mg Oral q1800  . carvedilol  6.25 mg Oral BID WC  . sodium chloride flush  3 mL Intravenous Q12H   Continuous Infusions: . sodium chloride    . sodium chloride 1 mL/kg/hr (12/17/18 0504)  . heparin 1,250 Units/hr (12/16/18 2051)   PRN Meds: sodium chloride, nitroGLYCERIN, sodium chloride flush   Vital Signs    Vitals:   12/16/18 1710 12/16/18 2039 12/16/18 2313 12/17/18 0403  BP: (!) 146/88 (!) 156/84 128/87 (!) 151/90  Pulse: 64  65 (!) 52  Resp:      Temp: 98.3 F (36.8 C) 99.1 F (37.3 C) 98.5 F (36.9 C) 97.7 F (36.5 C)  TempSrc: Oral Oral Oral Oral  SpO2:  99% 100% 100%  Weight:      Height:        Intake/Output Summary (Last 24 hours) at 12/17/2018 0819 Last data filed at 12/17/2018 0553 Gross per 24 hour  Intake 1307.21 ml  Output 1000 ml  Net 307.21 ml   Filed Weights   12/16/18 0308  Weight: 104.3 kg    Telemetry    Sinus rhythm with heart rate ranging between the mid 50's and 80's. - Personally Reviewed  ECG    Sinus rhythm, rate 61 bpm, with mild isolated ST elevation in V2 but no diagnostic changes of acute STEMI. - Personally Reviewed  Physical Exam   GEN: African-American male resting comfortably. Alert and in no acute distress.   Neck: Supple.  Cardiac: Borderline bradycardic with regular rhythm. No murmurs.  Respiratory: Clear to auscultation bilaterally. No wheezes, rhonchi, or rales. MS: No lower extremity edema. Vascular: Radial and distal pedal pulses 2+ and equal bilaterally. Skin: Warm and dry.  Neuro:  No focal deficits. Psych: Normal affect.    Labs    Chemistry Recent Labs  Lab 12/12/18 1645 12/16/18 0325 12/17/18 0302  NA 136 134* 139  K 3.7 3.4* 4.3  CL 104 99 105  CO2 23 27 24   GLUCOSE 127* 148* 95  BUN 10 12 11   CREATININE 1.03 1.05 1.13  CALCIUM 9.1 8.8* 8.7*  PROT  --  7.3  --   ALBUMIN  --  4.3  --   AST  --  18  --   ALT  --  14  --   ALKPHOS  --  51  --   BILITOT  --  1.0  --   GFRNONAA >60 >60 >60  GFRAA >60 >60 >60  ANIONGAP 9 8 10      Hematology Recent Labs  Lab 12/12/18 1645 12/16/18 0325 12/17/18 0302  WBC 9.5 10.1 9.5  RBC 5.55 5.37 5.22  HGB 15.8 15.0 14.6  HCT 49.5 48.0 45.7  MCV 89.2 89.4 87.5  MCH 28.5 27.9 28.0  MCHC 31.9 31.3 31.9  RDW 13.9 13.8 13.6  PLT 283 273 268    Cardiac Enzymes Recent Labs  Lab 12/12/18 1944 12/16/18 0325 12/16/18 0628 12/16/18 1830  TROPONINI <0.03 <0.03 0.09* 0.25*   No results for input(s): TROPIPOC in the last 168 hours.   BNPNo results for input(s):  BNP, PROBNP in the last 168 hours.   DDimer  Recent Labs  Lab 12/16/18 0325  DDIMER <0.27     Radiology    Dg Chest 2 View  Result Date: 12/16/2018 CLINICAL DATA:  Left-sided chest pain tonight. EXAM: CHEST - 2 VIEW COMPARISON:  Radiographs 12/12/2018 FINDINGS: The cardiomediastinal contours are normal. The lungs are clear. Pulmonary vasculature is normal. No consolidation, pleural effusion, or pneumothorax. No acute osseous abnormalities are seen. IMPRESSION: No acute pulmonary process. Electronically Signed   By: Narda Rutherford M.D.   On: 12/16/2018 03:59   Ct Angio Chest/abd/pel For Dissection W And/or Wo Contrast  Result Date: 12/16/2018 CLINICAL DATA:  Severe sharp chest pain radiating to the back. Hypertension. EXAM: CT ANGIOGRAPHY CHEST, ABDOMEN AND PELVIS TECHNIQUE: Multidetector CT imaging through the chest, abdomen and pelvis was performed using the standard protocol during bolus administration of intravenous contrast. Multiplanar reconstructed images and MIPs were  obtained and reviewed to evaluate the vascular anatomy. CONTRAST:  ISOVUE-370 IOPAMIDOL (ISOVUE-370) INJECTION 76% COMPARISON:  None. FINDINGS: CTA CHEST FINDINGS Cardiovascular: --Heart: The heart size is normal. There is nopericardial effusion. There is a left anterior descending coronary artery stent. --Aorta: The course and caliber of the thoracic aorta are normal. There is no aortic atherosclerotic calcification. Precontrast images show no aortic intramural hematoma. There is no blood pool, dissection or penetrating ulcer demonstrated on arterial phase postcontrast imaging. There is a conventional 3 vessel aortic arch branching pattern. The proximal arch vessels are widely patent. --Pulmonary Arteries: Contrast timing is optimized for preferential opacification of the aorta. Within that limitation, normal central pulmonary arteries. Mediastinum/Nodes: No mediastinal, hilar or axillary lymphadenopathy. The visualized thyroid and thoracic esophageal course are unremarkable. Lungs/Pleura: No pulmonary nodules or masses. No pleural effusion or pneumothorax. No focal airspace consolidation. No focal pleural abnormality. Musculoskeletal: No chest wall abnormality. No acute osseous findings. Review of the MIP images confirms the above findings. CTA ABDOMEN AND PELVIS FINDINGS VASCULAR Aorta: Normal caliber aorta without aneurysm, dissection, vasculitis or hemodynamically significant stenosis. There is no aortic atherosclerosis. Celiac: No aneurysm, dissection or hemodynamically significant stenosis. Normal branching pattern. SMA: Widely patent without dissection or stenosis. Renals: Single renal arteries bilaterally. No aneurysm, dissection, stenosis or evidence of fibromuscular dysplasia. IMA: Patent without abnormality. Inflow: No aneurysm, stenosis or dissection. Veins: Normal course and caliber of the major veins. Assessment is otherwise limited by the arterial dominant contrast phase. Review of the MIP  images confirms the above findings. NON-VASCULAR Hepatobiliary: Normal hepatic contours and density. No visible biliary dilatation. Normal gallbladder. Pancreas: Normal contours without ductal dilatation. No peripancreatic fluid collection. Spleen: Normal arterial phase splenic enhancement pattern. Adrenals/Urinary Tract: --Adrenal glands: Normal. --Right kidney/ureter: No hydronephrosis or perinephric stranding. No nephrolithiasis. No obstructing ureteral stones. --Left kidney/ureter: No hydronephrosis or perinephric stranding. No nephrolithiasis. No obstructing ureteral stones. --Urinary bladder: Unremarkable. Stomach/Bowel: --Stomach/Duodenum: No hiatal hernia or other gastric abnormality. Normal duodenal course and caliber. --Small bowel: No dilatation or inflammation. --Colon: No focal abnormality. --Appendix: Normal. Lymphatic: No abdominal or pelvic lymphadenopathy. Reproductive: Normal prostate and seminal vesicles. Musculoskeletal. No bony spinal canal stenosis or focal osseous abnormality. Other: None. Review of the MIP images confirms the above findings. . IMPRESSION: 1. No acute aortic syndrome. 2. No acute abnormality of the chest, abdomen or pelvis. Electronically Signed   By: Deatra Robinson M.D.   On: 12/16/2018 05:21    Cardiac Studies   None.  Patient Profile     Mike Frazier is a 37 year  old male with a history of CAD s/p NSTEMI NSTEMI with ramus lesion 100% stenosed and LAD 70-85% stenosed with FFR at 0.74 resulting in DES stent to LAD in 09/2016, diastolic heart failure with LVEF of 50%, hypertension, asthma, and previous tobacco use, who presented to [facility] on 12/16/2018 for evaluation of chest pain and was then [facility] further evaluation of unstable angina.   Assessment & Plan    NSTEMI - Patient presented with acute onset of chest pain with radiation to back and left arm. - EKG showed mild isolated ST elevation in V2 but no diagnostic changes  of acute STEMI. - Initial troponin negative. Repeat troponin mildly elevated at 0.09 >> 0.25.  - D-dimer negative. - Chest CTA showed no evidence of acute aortic syndrome. - Patient is chest pain free this morning.  - Continue IV Heparin drip. - Continue Aspirin, statin, and beta-blocker. - Patient is going for a left heart catheterization later today.  Hypertension - Most recent BP 151/90.  - Continue Amlodipine 5mg  daily. Consider increasing to 10mg  daily. - Continue Coreg 6.25mg  twice daily.   Hyperlipidemia - LDL 79 this admission. Goal <70 given CAD. - Continue Lipitor 80mg  daily.  Of note, MRSA PCR screening came back positive.   For questions or updates, please contact [facility] Please consult [email or web address] for contact info under        Signed, Corrin ParkerCallie E Goodrich, PA-C  12/17/2018, 8:19 AM    Patient seen, examined. Available data reviewed. Agree with findings, assessment, and plan as outlined by Marjie Skiffallie Goodrich, PA-C. On exam he is alert, oriented, in NAD. Lungs CTA, CV RRR, extremities no edema, TR band in place right wrist with no hematoma. He is s/p PCI of the LAD with a second DES inside the previous stent (used to treat severe in-stent restenosis). Anticipate DC tomorrow. Will ask CM to see patient for med needs.  Tonny BollmanMichael Ceria Suminski, M.D. 12/17/2018 1:12 PM

## 2018-12-17 NOTE — Progress Notes (Signed)
ANTICOAGULATION CONSULT NOTE  Pharmacy Consult for heparin Indication: chest pain/ACS  Allergies  Allergen Reactions  . Codeine Shortness Of Breath    Patient Measurements: Height: 5\' 9"  (175.3 cm) Weight: 230 lb (104.3 kg) IBW/kg (Calculated) : 70.7 Heparin Dosing Weight: 95kg  Vital Signs: Temp: 97.7 F (36.5 C) (12/19 0403) Temp Source: Oral (12/19 0403) BP: 151/90 (12/19 0403) Pulse Rate: 52 (12/19 0403)  Labs: Recent Labs    12/16/18 0325 12/16/18 0628 12/16/18 1554 12/16/18 1830 12/17/18 0302  HGB 15.0  --   --   --  14.6  HCT 48.0  --   --   --  45.7  PLT 273  --   --   --  268  HEPARINUNFRC  --   --  0.44  --  0.48  CREATININE 1.05  --   --   --  1.13  TROPONINI <0.03 0.09*  --  0.25*  --     Estimated Creatinine Clearance: 104.4 mL/min (by C-G formula based on SCr of 1.13 mg/dL).   Medical History: Past Medical History:  Diagnosis Date  . Abscess of finger   . Asthma   . Coronary artery disease   . High cholesterol   . Hypertension   . NSTEMI (non-ST elevated myocardial infarction) (HCC)   . Torsion, testicular     Assessment: 37yo male w/ significant cardiac PMH c/o left-sided CP, took NTGx2 without relief, notes that he was off meds and restarted yesterday, pharmacy to begin heparin.  Heparin level was therapeutic at 0.48, on 1250 units/hr. Hgb 14.6, plt 268. No s/sx of bleeding. No infusion issues.    Goal of Therapy:  Heparin level 0.3-0.7 units/ml Monitor platelets by anticoagulation protocol: Yes   Plan:  Continue heparin at 1250 units/hr Follow up after cath planned today  Sherron MondayKimberly Arneta Mahmood, PharmD, BCCCP Clinical Pharmacist  Pager: [phone] Phone: [phone] 12/17/2018 8:48 AM

## 2018-12-18 ENCOUNTER — Encounter (HOSPITAL_COMMUNITY): Payer: Self-pay | Admitting: Physician Assistant

## 2018-12-18 ENCOUNTER — Telehealth: Payer: Self-pay

## 2018-12-18 DIAGNOSIS — E669 Obesity, unspecified: Secondary | ICD-10-CM

## 2018-12-18 DIAGNOSIS — I2510 Atherosclerotic heart disease of native coronary artery without angina pectoris: Secondary | ICD-10-CM

## 2018-12-18 LAB — CBC
HCT: 45.5 % (ref 39.0–52.0)
Hemoglobin: 14.9 g/dL (ref 13.0–17.0)
MCH: 28.7 pg (ref 26.0–34.0)
MCHC: 32.7 g/dL (ref 30.0–36.0)
MCV: 87.5 fL (ref 80.0–100.0)
NRBC: 0 % (ref 0.0–0.2)
Platelets: 280 10*3/uL (ref 150–400)
RBC: 5.2 MIL/uL (ref 4.22–5.81)
RDW: 13.8 % (ref 11.5–15.5)
WBC: 9.8 10*3/uL (ref 4.0–10.5)

## 2018-12-18 LAB — BASIC METABOLIC PANEL
Anion gap: 10 (ref 5–15)
BUN: 9 mg/dL (ref 6–20)
CO2: 25 mmol/L (ref 22–32)
Calcium: 8.8 mg/dL — ABNORMAL LOW (ref 8.9–10.3)
Chloride: 103 mmol/L (ref 98–111)
Creatinine, Ser: 1.03 mg/dL (ref 0.61–1.24)
GFR calc Af Amer: >60 mL/min (ref >60)
GFR calc non Af Amer: >60 mL/min (ref >60)
Glucose, Bld: 102 mg/dL — ABNORMAL HIGH (ref 70–99)
Potassium: 3.8 mmol/L (ref 3.5–5.1)
Sodium: 138 mmol/L (ref 135–145)

## 2018-12-18 MED ORDER — ATORVASTATIN CALCIUM 80 MG PO TABS: 80.0000 mg | ORAL_TABLET | Freq: Every evening | ORAL | 6 refills | Status: DC

## 2018-12-18 MED ORDER — NITROGLYCERIN 0.4 MG SL SUBL: 0.4000 mg | SUBLINGUAL_TABLET | SUBLINGUAL | 3 refills | Status: DC | PRN

## 2018-12-18 MED ORDER — NITROGLYCERIN 0.4 MG SL SUBL: 0.4000 mg | SUBLINGUAL_TABLET | SUBLINGUAL | 3 refills | Status: AC | PRN

## 2018-12-18 MED ORDER — TICAGRELOR 90 MG PO TABS: 90.0000 mg | ORAL_TABLET | Freq: Two times a day (BID) | ORAL | 11 refills | Status: DC

## 2018-12-18 MED ORDER — LISINOPRIL 10 MG PO TABS: 10.0000 mg | ORAL_TABLET | Freq: Every day | ORAL | 6 refills | Status: DC

## 2018-12-18 MED ORDER — CARVEDILOL 6.25 MG PO TABS: 6.2500 mg | ORAL_TABLET | Freq: Two times a day (BID) | ORAL | 6 refills | Status: DC

## 2018-12-18 MED ORDER — AMLODIPINE BESYLATE 5 MG PO TABS: 5.0000 mg | ORAL_TABLET | Freq: Every day | ORAL | 6 refills | Status: DC

## 2018-12-18 MED ORDER — LISINOPRIL 10 MG PO TABS
10.0000 mg | ORAL_TABLET | Freq: Every day | ORAL | Status: DC
  Administered 2018-12-18: 13:00:00 10 mg via ORAL
  Filled 2018-12-18: qty 1

## 2018-12-18 MED FILL — BRILINTA 90 MG TABLET: 90 | 30 days supply | Qty: 60 | Fill #0

## 2018-12-18 MED FILL — Nitroglycerin IV Soln 100 MCG/ML in D5W: INTRA_ARTERIAL | Qty: 10 | Status: AC

## 2018-12-18 NOTE — Telephone Encounter (Signed)
Dunn, Dayna N, PA-C  P Cv Div Nl Triage Pt being dc today. [facility] so have added onto NL schedule 1/2 with [facility]. Will need TOC call. Thx

## 2018-12-18 NOTE — Discharge Summary (Addendum)
Discharge Summary    Patient ID: Mike Frazier,  MRN: [id number], DOB/AGE: February 24, 1981 37 y.o.  Admit date: 12/16/2018 Discharge date: 12/18/2018  Primary Care Provider: Patient, No Pcp Per Primary Cardiologist: Lesleigh Noe, MD Primary Electrophysiologist:  None  Discharge Diagnoses    Principal Problem:   NSTEMI (non-ST elevated myocardial infarction) [facility]) Active Problems:   Hyperlipidemia   Chest pain   CAD in native artery   Obesity   H/O noncompliance with medical treatment, presenting hazards to health  Diagnostic Studies/Procedures    Procedures   LEFT HEART CATH AND CORONARY ANGIOGRAPHY  Conclusion     Prox LAD lesion is 90% stenosed.  A drug-eluting stent was successfully placed.  Post intervention, there is a 0% residual stenosis.  The left ventricular systolic function is normal.  LV end diastolic pressure is normal.  The left ventricular ejection fraction is 55-65% by visual estimate.   Mike Frazier is a 37 y.o. male    [id number] LOCATION:  FACILITY: [facility]  PHYSICIAN: Nanetta Batty, M.D. [birth year]    _____________   History of Present Illness     Mike Frazier is a 37 y.o. male with history of CAD s/p NSTEMI s/p DES stent to LAD in 09/2016, hypertension, HLD, asthma, and previous tobacco use who has had recent CP. He was last seen in 11/2016 in an office visit when he had run out of his medicines due to cost. Brilinta was changed to Plavix at that time. He has not been seen in the office since then and had recently run out of his medicines for months. He had gotten them refilled the other day but had not been taking for long.  He initially presented to the ER 12/12/18 with chest pain worse with eating and drinking. His troponin was negative and he was discharged home. Omeprazole was added. He presented back to [facility] 12/16/18 with chest pain with radiation to back and left arm with intermittent numbness and  tingling in his left hand. It was improved with SL NTG but recurred so he came back to the ER. Initial EKG showed no ST changes however repeat EKG showed mild ST changes in V1. Initial troponin negative, but f/u ER troponin was 0.09 so he was admitted for further eval.  Hospital Course    1. CAD/NSTEMI with troponin peaking at 0.25. Chest CTA was without acute aortic disease. D-dimer was negative. He underwent cath yesterday with 90% prox LAD, LVEDP normal, EF 55-65%. Plavix was changed to Brilinta. He did well post cath without specific complications. Care management consult was placed.   2. HTN - home antihypertensives were continued except lisinopril held on admission. Blood pressure was mildly elevated today, will resume lisinopril. The patient was instructed to monitor their blood pressure at home and to call if tending to run higher than 130/80. If this were the case would probably titrate amlodipine to 10mg  daily.  3. Hyperlipidemia - LDL 79 but this was in setting of being out of meds for several months. If the patient is tolerating statin at time of follow-up appointment, would consider rechecking liver function/lipid panel in 6-8 weeks. If LDL remains suboptimal need to consider Zetia.  4. Obesity - recommend lifestyle modification.  Dr. Excell Seltzer has seen and examined the patient today and feels he is stable for discharge. TOC f/u arranged in [facility] Office due to appt availability, pt aware. 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 He was advised  to take it easy for a week then may return to work.  _____________  Discharge Vitals Blood pressure (!) 142/94, pulse 69, temperature 98.1 F (36.7 C), temperature source Oral, resp. rate 12, height 5\' 9"  (1.753 m), weight 104.3 kg, SpO2 100 %.  Filed Weights   12/16/18 0308  Weight: 104.3 kg  General: Well developed, well nourished morbidly obese, in no acute distress Head: Normocephalic, atraumatic, sclera  non-icteric, no xanthomas, nares are without discharge. Neck: Negative for carotid bruits. JVP not elevated. Lungs: Clear bilaterally to auscultation without wheezes, rales, or rhonchi. Breathing is unlabored. Heart: RRR S1 S2 without murmurs, rubs, or gallops.  Abdomen: Soft, non-tender, non-distended with normoactive bowel sounds. No rebound/guarding. Extremities: No clubbing or cyanosis. No edema. Distal pedal pulses are 2+ and equal bilaterally. Right radial cath site without hematoma or ecchymosis; good pulse. Neuro: Alert and oriented X 3. Moves all extremities spontaneously. Psych:  Responds to questions appropriately with a normal affect.   Labs & Radiologic Studies    CBC Recent Labs    12/16/18 0325 12/17/18 0302 12/18/18 0123  WBC 10.1 9.5 9.8  NEUTROABS 3.1  --   --   HGB 15.0 14.6 14.9  HCT 48.0 45.7 45.5  MCV 89.4 87.5 87.5  PLT 273 268 280   Basic Metabolic Panel Recent Labs    16/09/9611/19/19 0302 12/18/18 0123  NA 139 138  K 4.3 3.8  CL 105 103  CO2 24 25  GLUCOSE 95 102*  BUN 11 9  CREATININE 1.13 1.03  CALCIUM 8.7* 8.8*   Liver Function Tests Recent Labs    12/16/18 0325  AST 18  ALT 14  ALKPHOS 51  BILITOT 1.0  PROT 7.3  ALBUMIN 4.3   Recent Labs    12/16/18 0325  LIPASE 41   Cardiac Enzymes Recent Labs    12/16/18 0325 12/16/18 0628 12/16/18 1830  TROPONINI <0.03 0.09* 0.25*   BNP Invalid input(s): POCBNP D-Dimer Recent Labs    12/16/18 0325  DDIMER <0.27   Hemoglobin A1C No results for input(s): HGBA1C in the last 72 hours. Fasting Lipid Panel Recent Labs    12/16/18 1830  CHOL 168  HDL 55  LDLCALC 79  TRIG 172*  CHOLHDL 3.1   Thyroid Function Tests No results for input(s): TSH, T4TOTAL, T3FREE, THYROIDAB in the last 72 hours.  Invalid input(s): FREET3 _____________  Dg Chest 2 View  Result Date: 12/16/2018 CLINICAL DATA:  Left-sided chest pain tonight. EXAM: CHEST - 2 VIEW COMPARISON:  Radiographs 12/12/2018  FINDINGS: The cardiomediastinal contours are normal. The lungs are clear. Pulmonary vasculature is normal. No consolidation, pleural effusion, or pneumothorax. No acute osseous abnormalities are seen. IMPRESSION: No acute pulmonary process. Electronically Signed   By: Narda RutherfordMelanie  Sanford M.D.   On: 12/16/2018 03:59   Dg Chest 2 View  Result Date: 12/12/2018 CLINICAL DATA:  Chest pain and headache. EXAM: CHEST - 2 VIEW COMPARISON:  September 27, 2016 FINDINGS: The heart size and mediastinal contours are within normal limits. Both lungs are clear. The visualized skeletal structures are unremarkable. IMPRESSION: No active cardiopulmonary disease. Electronically Signed   By: Gerome Samavid  Williams III M.D   On: 12/12/2018 16:59   Ct Angio Chest/abd/pel For Dissection W And/or Wo Contrast  Result Date: 12/16/2018 CLINICAL DATA:  Severe sharp chest pain radiating to the back. Hypertension. EXAM: CT ANGIOGRAPHY CHEST, ABDOMEN AND PELVIS TECHNIQUE: Multidetector CT imaging through the chest, abdomen and pelvis was performed using the standard protocol during bolus administration  of intravenous contrast. Multiplanar reconstructed images and MIPs were obtained and reviewed to evaluate the vascular anatomy. CONTRAST:  ISOVUE-370 IOPAMIDOL (ISOVUE-370) INJECTION 76% COMPARISON:  None. FINDINGS: CTA CHEST FINDINGS Cardiovascular: --Heart: The heart size is normal. There is nopericardial effusion. There is a left anterior descending coronary artery stent. --Aorta: The course and caliber of the thoracic aorta are normal. There is no aortic atherosclerotic calcification. Precontrast images show no aortic intramural hematoma. There is no blood pool, dissection or penetrating ulcer demonstrated on arterial phase postcontrast imaging. There is a conventional 3 vessel aortic arch branching pattern. The proximal arch vessels are widely patent. --Pulmonary Arteries: Contrast timing is optimized for preferential opacification of the  aorta. Within that limitation, normal central pulmonary arteries. Mediastinum/Nodes: No mediastinal, hilar or axillary lymphadenopathy. The visualized thyroid and thoracic esophageal course are unremarkable. Lungs/Pleura: No pulmonary nodules or masses. No pleural effusion or pneumothorax. No focal airspace consolidation. No focal pleural abnormality. Musculoskeletal: No chest wall abnormality. No acute osseous findings. Review of the MIP images confirms the above findings. CTA ABDOMEN AND PELVIS FINDINGS VASCULAR Aorta: Normal caliber aorta without aneurysm, dissection, vasculitis or hemodynamically significant stenosis. There is no aortic atherosclerosis. Celiac: No aneurysm, dissection or hemodynamically significant stenosis. Normal branching pattern. SMA: Widely patent without dissection or stenosis. Renals: Single renal arteries bilaterally. No aneurysm, dissection, stenosis or evidence of fibromuscular dysplasia. IMA: Patent without abnormality. Inflow: No aneurysm, stenosis or dissection. Veins: Normal course and caliber of the major veins. Assessment is otherwise limited by the arterial dominant contrast phase. Review of the MIP images confirms the above findings. NON-VASCULAR Hepatobiliary: Normal hepatic contours and density. No visible biliary dilatation. Normal gallbladder. Pancreas: Normal contours without ductal dilatation. No peripancreatic fluid collection. Spleen: Normal arterial phase splenic enhancement pattern. Adrenals/Urinary Tract: --Adrenal glands: Normal. --Right kidney/ureter: No hydronephrosis or perinephric stranding. No nephrolithiasis. No obstructing ureteral stones. --Left kidney/ureter: No hydronephrosis or perinephric stranding. No nephrolithiasis. No obstructing ureteral stones. --Urinary bladder: Unremarkable. Stomach/Bowel: --Stomach/Duodenum: No hiatal hernia or other gastric abnormality. Normal duodenal course and caliber. --Small bowel: No dilatation or inflammation. --Colon:  No focal abnormality. --Appendix: Normal. Lymphatic: No abdominal or pelvic lymphadenopathy. Reproductive: Normal prostate and seminal vesicles. Musculoskeletal. No bony spinal canal stenosis or focal osseous abnormality. Other: None. Review of the MIP images confirms the above findings. . IMPRESSION: 1. No acute aortic syndrome. 2. No acute abnormality of the chest, abdomen or pelvis. Electronically Signed   By: Deatra Robinson M.D.   On: 12/16/2018 05:21   Disposition   Pt is being discharged home today in good condition.  Follow-up Plans & Appointments    Follow-up Information    [facility] Follow up on 01/05/2019.   Why:  1:30 for hospital follow up Contact information: [address] [place] [address]       [facility] Follow up.   Why:  use pharmacy for discount medications Contact information: [address] [facility] [address] [phone]       Barrett, Joline Salt, PA-C Follow up.   Specialties:  Cardiology, Radiology Why:  [facility] office - see appointment below. Bjorn Loser is one of the PAs that works with our cardiology group. NOTE: this is in the office location in [facility].  Contact information: [address] [place] [address] [phone]          Discharge Instructions    Amb Referral to Cardiac Rehabilitation  Complete by:  As directed    To [place]   Diagnosis:   Coronary Stents PTCA NSTEMI     Diet - low sodium heart healthy   Complete by:  As directed    Increase activity slowly   Complete by:  As directed    Please monitor your blood pressure occasionally at home. Call your doctor if you tend to get readings of greater than 130 on the top number or 80 on the bottom number.  No driving for 1 week. No lifting over 10 lbs for 2 weeks. No sexual activity for 2 weeks. You may return to work in 1 week. Keep  procedure site clean & dry. If you notice increased pain, swelling, bleeding or pus, call/return!  You may shower, but no soaking baths/hot tubs/pools for 1 week.      Discharge Medications   Allergies as of 12/18/2018      Reactions   Codeine Shortness Of Breath      Medication List    STOP taking these medications   clopidogrel 75 MG tablet Commonly known as:  PLAVIX   omeprazole 20 MG capsule Commonly known as:  PRILOSEC     TAKE these medications   amLODipine 5 MG tablet Commonly known as:  NORVASC Take 1 tablet (5 mg total) by mouth daily.   aspirin 81 MG EC tablet Take 1 tablet (81 mg total) by mouth daily.   atorvastatin 80 MG tablet Commonly known as:  LIPITOR Take 1 tablet (80 mg total) by mouth every evening. What changed:  when to take this   carvedilol 6.25 MG tablet Commonly known as:  COREG Take 1 tablet (6.25 mg total) by mouth 2 (two) times daily with a meal.   lisinopril 10 MG tablet Commonly known as:  PRINIVIL,ZESTRIL Take 1 tablet (10 mg total) by mouth daily.   nitroGLYCERIN 0.4 MG SL tablet Commonly known as:  NITROSTAT Place 1 tablet (0.4 mg total) under the tongue every 5 (five) minutes as needed for chest pain (up to 3 doses). What changed:  reasons to take this   ticagrelor 90 MG Tabs tablet Commonly known as:  BRILINTA Take 1 tablet (90 mg total) by mouth 2 (two) times daily.        Allergies:  Allergies  Allergen Reactions  . Codeine Shortness Of Breath    Aspirin prescribed at discharge?  Yes High Intensity Statin Prescribed? (Lipitor 40-80mg  or Crestor 20-40mg ): Yes Beta Blocker Prescribed? Yes For EF <40%, was ACEI/ARB Prescribed? No: na ADP Receptor Inhibitor Prescribed? (i.e. Plavix etc.-Includes Medically Managed Patients): Yes For EF <40%, Aldosterone Inhibitor Prescribed? No: na Was EF assessed during THIS hospitalization? Yes Was Cardiac Rehab II ordered? (Included Medically managed Patients): Yes   Outstanding  Labs/Studies   N/a  Duration of Discharge Encounter   Greater than 30 minutes including physician time.  Signed, Laurann Montana PA-C 12/18/2018, 9:41 AM   Patient seen, examined. Available data reviewed. Agree with findings, assessment, and plan as outlined by Ronie Spies, PA-C.  On my exam the patient is alert, oriented, in no distress.  JVP is normal, carotid upstrokes are normal without bruits, lungs are clear bilaterally, heart is regular rate and rhythm no murmur gallop, abdomen soft and nontender with no masses, extremities show no edema.  Right radial site is clear.  I reviewed the patient's medical program which includes dual antiplatelet therapy with aspirin and ticagrelor, high intensity statin drug, and multidrug antihypertensive therapy.  He seems  to be tolerating his medications well.  We are working on follow-up arrangements as an outpatient.  He is stable for hospital discharge today.  Mike BollmanMichael Rmani Frazier, M.D. 12/18/2018 12:32 PM

## 2018-12-18 NOTE — Progress Notes (Signed)
Spoke to pharmacist and bedside nurse. TOC pharmacy will provide meds at DC, per CM report, MATCH letter has been given. No further CM needs

## 2018-12-18 NOTE — Progress Notes (Signed)
Pt seen today, doing well post stent to LAD. Anticipate DC Today. Resume lisinopril for BP today. Asked pt to monitor BP at home. Note he was previously unable to afford Brilinta so was changed to Plavix in 2017. PharmD is working with care management to figure out what we can do about this. Not clear he will qualify for the 30 day free as he's used before.  Note he had been out of meds for several months prior to this admission, likely contributing.  F/u arranged at [facility] office due to appt availability. Will keep statin at current dose - LDL 79 but will need to follow s/p compliance before adding Zetia. Formal DC summary to follow.  Whit Bruni PA-C

## 2018-12-18 NOTE — Progress Notes (Signed)
CARDIAC REHAB PHASE I   PRE:  Rate/Rhythm: 60 SR    BP: sitting 166/96    SaO2:   MODE:  Ambulation: 500 ft   POST:  Rate/Rhythm: 73 SR    BP: sitting 172/98     SaO2:   Tolerated well, no c/o. BP elevated. Ed completed with good reception. He is ready for change. Motivated to quit smoking (threw his cigarettes away on admit). Understands the importance of Brilinta/ASA (will prob need to change to plavix eventually due to cost). Discussed diet and ex and NTG. [facility]  [phone]   Mike MassonRandi Kristan Victorine Frazier CES, ACSM 12/18/2018 9:16 AM

## 2018-12-18 NOTE — Progress Notes (Signed)
[facility] BAND REMOVAL  LOCATION:    right radial  DEFLATED PER PROTOCOL:    Yes.    TIME BAND OFF / DRESSING APPLIED:    1958   SITE UPON ARRIVAL:    Level 0  SITE AFTER BAND REMOVAL:    Level 0  CIRCULATION SENSATION AND MOVEMENT:    Within Normal Limits   Yes.    COMMENTS:   Pt.tolerated procedure well 

## 2018-12-21 NOTE — Telephone Encounter (Signed)
LEFT MESSAGE TO CALL BACK - OKAY TO SPEAK TO ANY TRIAGE NURSE - TCM CALL

## 2018-12-22 NOTE — Telephone Encounter (Signed)
TOC attempt #[id number] 

## 2018-12-24 NOTE — Telephone Encounter (Signed)
TCM # 3 left message of appt date and time.

## 2018-12-31 ENCOUNTER — Ambulatory Visit: Payer: Self-pay | Admitting: Physician Assistant

## 2018-12-31 NOTE — Progress Notes (Deleted)
Cardiology Office Note   Date:  12/31/2018   ID:  Mike Frazier, DOB 11/05/1981, MRN [id number]  PCP:  Patient, No Pcp Per Cardiologist:  Lesleigh NoeHenry W Smith III, MD 12/12/2016 Theodore Demarkhonda Desirre Eickhoff, PA-C   No chief complaint on file.   History of Present Illness: Mike Frazier is a 38 y.o. male with a history of NSTEMI 2017 w/ DES LAD, HTN, HLD, asthma, remote tob use, obesity  Admitted 12/18-12/20/2019 for non-STEMI, s/p DES LAD, EF nl, Plavix>>Brilinta, titrate amlodipine prn for BP, has been noncompliant with meds  Mike Frazier presents for ***   Past Medical History:  Diagnosis Date  . Asthma   . Coronary artery disease    a. NSTEMI  s/p DES stent to LAD in 09/2016. b. NSTEMI s/p DES to LAD in 11/2018, EF 55-65%.  . H/O noncompliance with medical treatment, presenting hazards to health   . High cholesterol   . Hypertension   . Migraine    "grew out of them; sports related" (12/17/2018)  . NSTEMI (non-ST elevated myocardial infarction) (HCC) 08/2016; 12/16/2018  . Torsion, testicular     Past Surgical History:  Procedure Laterality Date  . CARDIAC CATHETERIZATION N/A 09/30/2016   Procedure: Left Heart Cath and Coronary Angiography;  Surgeon: Lyn RecordsHenry W Smith, MD;  [facility];  Service: Cardiovascular;  Laterality: N/A;  . CARDIAC CATHETERIZATION N/A 09/30/2016   Procedure: Intravascular Pressure Wire/FFR Study;  Surgeon: Lyn RecordsHenry W Smith, MD;  [facility];  Service: Cardiovascular;  Laterality: N/A;  . CARDIAC CATHETERIZATION N/A 09/30/2016   Procedure: Coronary Stent Intervention;  Surgeon: Lyn RecordsHenry W Smith, MD;  [facility];  Service: Cardiovascular;  Laterality: N/A;  . CORONARY ANGIOPLASTY WITH STENT PLACEMENT  12/17/2018  . I&D EXTREMITY  10/21/2012   Procedure: IRRIGATION AND DEBRIDEMENT EXTREMITY;  Surgeon: Marlowe ShoresMatthew A Weingold, MD;  Location: [facility] OR;  Service: Orthopedics;  Laterality: Left;  . INCISION / DRAINAGE HAND /  FINGER Right    "bit by spider twice; once on left thumb; once on right middle finger" (12/17/2018  . LEFT HEART CATH AND CORONARY ANGIOGRAPHY N/A 12/17/2018   Procedure: LEFT HEART CATH AND CORONARY ANGIOGRAPHY;  Surgeon: Runell GessBerry, Jonathan J, MD;  Location: [facility] INVASIVE CV LAB;  Service: Cardiovascular;  Laterality: N/A;  . TESTICLE TORSION REDUCTION  ~ 1997    Current Outpatient Medications  Medication Sig Dispense Refill  . amLODipine (NORVASC) 5 MG tablet Take 1 tablet (5 mg total) by mouth daily. 30 tablet 6  . aspirin 81 MG EC tablet Take 1 tablet (81 mg total) by mouth daily. 30 tablet 3  . atorvastatin (LIPITOR) 80 MG tablet Take 1 tablet (80 mg total) by mouth every evening. 30 tablet 6  . carvedilol (COREG) 6.25 MG tablet Take 1 tablet (6.25 mg total) by mouth 2 (two) times daily with a meal. 60 tablet 6  . lisinopril (PRINIVIL,ZESTRIL) 10 MG tablet Take 1 tablet (10 mg total) by mouth daily. 30 tablet 6  . nitroGLYCERIN (NITROSTAT) 0.4 MG SL tablet Place 1 tablet (0.4 mg total) under the tongue every 5 (five) minutes as needed for chest pain (up to 3 doses). 25 tablet 3  . ticagrelor (BRILINTA) 90 MG TABS tablet Take 1 tablet (90 mg total) by mouth 2 (two) times daily. 60 tablet 11   No current facility-administered medications for this visit.     Allergies:   Codeine    Social History:  The patient  reports  that he has been smoking cigarettes. He has a 9.20 pack-year smoking history. He has never used smokeless tobacco. He reports current alcohol use. He reports current drug use. Drug: Marijuana.   Family History:  The patient's family history includes Heart attack in his sister; Hypertension in his maternal grandmother and mother.  He indicated that his mother is alive. He indicated that his father is alive. He indicated that the status of his sister is unknown. He indicated that his maternal grandmother is alive. He indicated that his maternal grandfather is alive. He indicated  that his paternal grandmother is alive. He indicated that his paternal grandfather is alive.    ROS:  Please see the history of present illness. All other systems are reviewed and negative.    PHYSICAL EXAM: VS:  There were no vitals taken for this visit. , BMI There is no height or weight on file to calculate BMI. GEN: Well nourished, well developed, male in no acute distress HEENT: normal for age  Neck: no JVD, no carotid bruit, no masses Cardiac: RRR; no murmur, no rubs, or gallops Respiratory:  clear to auscultation bilaterally, normal work of breathing GI: soft, nontender, nondistended, + BS MS: no deformity or atrophy; no edema; distal pulses are 2+ in all 4 extremities  Skin: warm and dry, no rash Neuro:  Strength and sensation are intact Psych: euthymic mood, full affect   EKG:  EKG {ACTION; IS/IS [id number]} ordered today. The ekg ordered today demonstrates ***  ECHO: ***  CATH: 12/17/2018 LEFT HEART CATH AND CORONARY ANGIOGRAPHY  Conclusion     Prox LAD lesion is 90% stenosed.  A drug-eluting stent was successfully placed.  Post intervention, there is a 0% residual stenosis.  The left ventricular systolic function is normal.  LV end diastolic pressure is normal.  The left ventricular ejection fraction is 55-65% by visual estimate.     Recent Labs: 12/16/2018: ALT 14 12/18/2018: BUN 9; Creatinine, Ser 1.03; Hemoglobin 14.9; Platelets 280; Potassium 3.8; Sodium 138  CBC    Component Value Date/Time   WBC 9.8 12/18/2018 0123   RBC 5.20 12/18/2018 0123   HGB 14.9 12/18/2018 0123   HCT 45.5 12/18/2018 0123   PLT 280 12/18/2018 0123   MCV 87.5 12/18/2018 0123   MCH 28.7 12/18/2018 0123   MCHC 32.7 12/18/2018 0123   RDW 13.8 12/18/2018 0123   LYMPHSABS 5.5 (H) 12/16/2018 0325   MONOABS 1.1 (H) 12/16/2018 0325   EOSABS 0.3 12/16/2018 0325   BASOSABS 0.1 12/16/2018 0325   CMP Latest Ref Rng & Units 12/18/2018 12/17/2018 12/16/2018  Glucose 70 -  99 mg/dL 657(Q102(H) 95 469(G148(H)  BUN 6 - 20 mg/dL 9 11 12   Creatinine 0.61 - 1.24 mg/dL 2.951.03 2.841.13 1.321.05  Sodium 135 - 145 mmol/L 138 139 134(L)  Potassium 3.5 - 5.1 mmol/L 3.8 4.3 3.4(L)  Chloride 98 - 111 mmol/L 103 105 99  CO2 22 - 32 mmol/L 25 24 27   Calcium 8.9 - 10.3 mg/dL 4.4(W8.8(L) 1.0(U8.7(L) 7.2(Z8.8(L)  Total Protein 6.5 - 8.1 g/dL - - 7.3  Total Bilirubin 0.3 - 1.2 mg/dL - - 1.0  Alkaline Phos 38 - 126 U/L - - 51  AST 15 - 41 U/L - - 18  ALT 0 - 44 U/L - - 14     Lipid Panel Lab Results  Component Value Date   CHOL 168 12/16/2018   HDL 55 12/16/2018   LDLCALC 79 12/16/2018   TRIG 172 (H) 12/16/2018   CHOLHDL  3.1 12/16/2018      Wt Readings from Last 3 Encounters:  12/16/18 230 lb (104.3 kg)  12/12/18 230 lb (104.3 kg)  06/18/17 230 lb (104.3 kg)     Other studies Reviewed: Additional studies/ records that were reviewed today include: ***.  ASSESSMENT AND PLAN:  1.  ***   Current medicines are reviewed at length with the patient today.  The patient {ACTIONS; HAS/DOES NOT HAVE:19233} concerns regarding medicines.  The following changes have been made:  {PLAN; NO CHANGE:13088:s}  Labs/ tests ordered today include: *** No orders of the defined types were placed in this encounter.    Disposition:   FU with Lesleigh Noe, MD  Signed, Theodore Demark, PA-C  12/31/2018 8:04 AM    [facility] Phone: [phone]; Fax: [phone]

## 2019-01-05 ENCOUNTER — Ambulatory Visit (INDEPENDENT_AMBULATORY_CARE_PROVIDER_SITE_OTHER): Payer: Self-pay | Admitting: Family Medicine

## 2019-01-05 ENCOUNTER — Encounter: Payer: Self-pay | Admitting: Family Medicine

## 2019-01-05 VITALS — BP 129/84 | HR 72 | Resp 17 | Ht 68.0 in | Wt 223.2 lb

## 2019-01-05 DIAGNOSIS — R7303 Prediabetes: Secondary | ICD-10-CM

## 2019-01-05 DIAGNOSIS — I10 Essential (primary) hypertension: Secondary | ICD-10-CM

## 2019-01-05 DIAGNOSIS — E785 Hyperlipidemia, unspecified: Secondary | ICD-10-CM

## 2019-01-05 DIAGNOSIS — F17200 Nicotine dependence, unspecified, uncomplicated: Secondary | ICD-10-CM

## 2019-01-05 DIAGNOSIS — F17210 Nicotine dependence, cigarettes, uncomplicated: Secondary | ICD-10-CM

## 2019-01-05 DIAGNOSIS — Z7689 Persons encountering health services in other specified circumstances: Secondary | ICD-10-CM

## 2019-01-05 DIAGNOSIS — I25810 Atherosclerosis of coronary artery bypass graft(s) without angina pectoris: Secondary | ICD-10-CM

## 2019-01-05 NOTE — Patient Instructions (Addendum)
Thank you for choosing Primary Care at [facility] to be your medical home!    Mike Frazier was seen by Molli Barrows, FNP today.   Zaydrian Mcarthy's primary care provider is Patient, No Pcp Per.   For the best care possible, you should try to see Molli Barrows, FNP-C whenever you come to the clinic.   We look forward to seeing you again soon!  If you have any questions about your visit today, please call us at [phone] or feel free to reach your primary care provider via [place].     Please contact our office if you are unable to obtain your medications.    Physical Activity With Heart Disease Being active has many benefits, especially if you have heart disease. Physical activity can help you do more and feel healthier. Start slowly, and increase the amount of time you spend being active. Most adults should aim for physical activity that:  Makes you breathe harder and raises your heart rate (aerobic activity). Try to get at least 150 minutes of aerobic activity each week. This is about 30 minutes each day, 5 days a week.  Helps build muscle strength (strengthening activity). Do this at least 2 times a week. Always talk with your health care provider before starting any new activity program or if you have any changes in your condition. What are the benefits of physical activity? When you have heart disease, physical activity can help:  Lower your blood pressure.  Lower your cholesterol.  Control your weight.  Improve your sleep.  Help control your blood sugar.  Improve your heart and lung function.  Reduce your risk for blood clots (thrombophlebitis).  Improve your energy level.  Reduce stress. What are some types of physical activity I could try? There are many ways to be active. Talk with your health care provider about what types and intensity of activity is right for you. Aerobic activity  Aerobic (cardiovascular) activity can be moderate or vigorous  intensity, depending on how hard you are working. Moderate-intensity activity includes:  Walking.  Slow bicycling.  Water aerobics.  Dancing.  Light gardening or house work. Vigorous-intensity activity includes:  Jogging or running.  Stair climbing.  Swimming laps.  Hiking uphill.  Heavy gardening, such as digging trenches.  Strengthening activity Strengthening activities work your muscles to build strength. Some examples include:  Doing push-ups, sit-ups, or pull-ups.  Lifting small weights.  Using resistance bands.  Flexibility Flexibility activities lengthen your muscles to keep them flexible and less tight and improve your balance. Some examples include:  Stretching.  Yoga.  Tai chi.  [place] barre.  Follow these instructions at home: How to get started  Talk with your health care provider about: ? What types of activities are safe for you. ? If you should check your pulse or take other precautions during physical activity.  Get a calendar. Write down a schedule and plan for your new routine.  Take time to find out what works for you. Consider: ? Joining a Camera operator, such as a biking group, yoga class, local gym, or swimming pool membership. ? Be active on your own by downloading free workout applications on a smartphone or other devices, or by purchasing workout DVDs.  If you have not been active, begin with sessions that last 10-15 minutes. Gradually work up to sessions that last 20-30 minutes, 5 times a week. Follow all of your health care provider's recommendations.  Be patient with yourself. It takes time to build  up strength and lung capacity. Safety  Exercise in an indoor, climate-controlled facility, as told by your health care provider. You may need to do this if: ? There are extreme outdoor conditions, such as heat, humidity, or cold. ? There is an air pollution advisory. Your local news, board of health, or hospital can provide  information on air quality.  Take extra precautions as told by your health care provider. This may include: ? Monitoring your heart rate. ? Avoiding heavy lifting. ? Understanding how your medicines can affect you during physical activity. Certain medicines may cause heat intolerance or changes in blood sugar. ? Slowing down to rest when you need to. ? Keeping nitroglycerin spray and tablets with you at all times if you have angina. Use them as told to prevent and treat symptoms.  Drink plenty of water before, during, and after physical activity.  Know what symptoms may be signs of a problem. Stop physical activity right away if you have any of these symptoms. Get help right away if you have any of the following during exercise:  Chest pain, shortness of breath, or feel very tired.  Pain in the arm, shoulder, neck, or jaw.  Feel weak, dizzy, or light-headed.  An irregular heart rate, or your heart rate is greater than 100 beats per minute (bpm) before exercise. These symptoms may represent a serious problem that is an emergency. Do not wait to see if the symptoms go away. Get medical help right away. Call your local emergency services (911 in the [place] Do not drive yourself to the hospital.  Summary  Physical activity has many benefits, especially if you have heart disease.  Before starting an activity program, talk with your health care provider about how often to be active and what type of activity is safe for you.  Your physical activity plan may include moderate or vigorous aerobic activity, strengthening activities, and flexibility.  Know what symptoms may be signs of a problem. Stop physical activity right away and call emergency services (911 in the [place] if you have any of these symptoms. This information is not intended to replace advice given to you by your health care provider. Make sure you discuss any questions you have with your health care provider. Document Released:  07/13/2014 Document Revised: 01/07/2018 Document Reviewed: 01/07/2018 Elsevier Interactive Patient Education  2019 [place] DASH stands for "Dietary Approaches to Stop Hypertension." The DASH eating plan is a healthy eating plan that has been shown to reduce high blood pressure (hypertension). It may also reduce your risk for type 2 diabetes, heart disease, and stroke. The DASH eating plan may also help with weight loss. What are tips for following this plan?  General guidelines  Avoid eating more than 2,300 mg (milligrams) of salt (sodium) a day. If you have hypertension, you may need to reduce your sodium intake to 1,500 mg a day.  Limit alcohol intake to no more than 1 drink a day for nonpregnant women and 2 drinks a day for men. One drink equals 12 oz of beer, 5 oz of wine, or 1 oz of hard liquor.  Work with your health care provider to maintain a healthy body weight or to lose weight. Ask what an ideal weight is for you.  Get at least 30 minutes of exercise that causes your heart to beat faster (aerobic exercise) most days of the week. Activities may include walking, swimming, or biking.  Work with your health care provider  or diet and nutrition specialist (dietitian) to adjust your eating plan to your individual calorie needs. Reading food labels   Check food labels for the amount of sodium per serving. Choose foods with less than 5 percent of the Daily Value of sodium. Generally, foods with less than 300 mg of sodium per serving fit into this eating plan.  To find whole grains, look for the word "whole" as the first word in the ingredient list. Shopping  Buy products labeled as "low-sodium" or "no salt added."  Buy fresh foods. Avoid canned foods and premade or frozen meals. Cooking  Avoid adding salt when cooking. Use salt-free seasonings or herbs instead of table salt or sea salt. Check with your health care provider or pharmacist before using salt  substitutes.  Do not fry foods. Cook foods using healthy methods such as baking, boiling, grilling, and broiling instead.  Cook with heart-healthy oils, such as olive, canola, soybean, or sunflower oil. Meal planning  Eat a balanced diet that includes: ? 5 or more servings of fruits and vegetables each day. At each meal, try to fill half of your plate with fruits and vegetables. ? Up to 6-8 servings of whole grains each day. ? Less than 6 oz of lean meat, poultry, or fish each day. A 3-oz serving of meat is about the same size as a deck of cards. One egg equals 1 oz. ? 2 servings of low-fat dairy each day. ? A serving of nuts, seeds, or beans 5 times each week. ? Heart-healthy fats. Healthy fats called Omega-3 fatty acids are found in foods such as flaxseeds and coldwater fish, like sardines, salmon, and mackerel.  Limit how much you eat of the following: ? Canned or prepackaged foods. ? Food that is high in trans fat, such as fried foods. ? Food that is high in saturated fat, such as fatty meat. ? Sweets, desserts, sugary drinks, and other foods with added sugar. ? Full-fat dairy products.  Do not salt foods before eating.  Try to eat at least 2 vegetarian meals each week.  Eat more home-cooked food and less restaurant, buffet, and fast food.  When eating at a restaurant, ask that your food be prepared with less salt or no salt, if possible. What foods are recommended? The items listed may not be a complete list. Talk with your dietitian about what dietary choices are best for you. Grains Whole-grain or whole-wheat bread. Whole-grain or whole-wheat pasta. Brown rice. [place]. Bulgur. Whole-grain and low-sodium cereals. Pita bread. Low-fat, low-sodium crackers. Whole-wheat flour tortillas. Vegetables Fresh or frozen vegetables (raw, steamed, roasted, or grilled). Low-sodium or reduced-sodium tomato and vegetable juice. Low-sodium or reduced-sodium tomato sauce and tomato  paste. Low-sodium or reduced-sodium canned vegetables. Fruits All fresh, dried, or frozen fruit. Canned fruit in natural juice (without added sugar). Meat and other protein foods Skinless chicken or [place]. Ground chicken or [place]. Pork with fat trimmed off. Fish and seafood. Egg whites. Dried beans, peas, or lentils. Unsalted nuts, nut butters, and seeds. Unsalted canned beans. Lean cuts of beef with fat trimmed off. Low-sodium, lean deli meat. Dairy Low-fat (1%) or fat-free (skim) milk. Fat-free, low-fat, or reduced-fat cheeses. Nonfat, low-sodium ricotta or cottage cheese. Low-fat or nonfat yogurt. Low-fat, low-sodium cheese. Fats and oils Soft margarine without trans fats. Vegetable oil. Low-fat, reduced-fat, or light mayonnaise and salad dressings (reduced-sodium). Canola, safflower, olive, soybean, and sunflower oils. Avocado. Seasoning and other foods Herbs. Spices. Seasoning mixes without salt. Unsalted popcorn and pretzels.  Fat-free sweets. What foods are not recommended? The items listed may not be a complete list. Talk with your dietitian about what dietary choices are best for you. Grains Baked goods made with fat, such as croissants, muffins, or some breads. Dry pasta or rice meal packs. Vegetables Creamed or fried vegetables. Vegetables in a cheese sauce. Regular canned vegetables (not low-sodium or reduced-sodium). Regular canned tomato sauce and paste (not low-sodium or reduced-sodium). Regular tomato and vegetable juice (not low-sodium or reduced-sodium). Angie Fava. Olives. Fruits Canned fruit in a light or heavy syrup. Fried fruit. Fruit in cream or butter sauce. Meat and other protein foods Fatty cuts of meat. Ribs. Fried meat. Berniece Salines. Sausage. Bologna and other processed lunch meats. Salami. Fatback. Hotdogs. Bratwurst. Salted nuts and seeds. Canned beans with added salt. Canned or smoked fish. Whole eggs or egg yolks. Chicken or [place] with skin. Dairy Whole or 2% milk,  cream, and half-and-half. Whole or full-fat cream cheese. Whole-fat or sweetened yogurt. Full-fat cheese. Nondairy creamers. Whipped toppings. Processed cheese and cheese spreads. Fats and oils Butter. Stick margarine. Lard. Shortening. Ghee. Bacon fat. Tropical oils, such as coconut, palm kernel, or palm oil. Seasoning and other foods Salted popcorn and pretzels. Onion salt, garlic salt, seasoned salt, table salt, and sea salt. Worcestershire sauce. Tartar sauce. Barbecue sauce. Teriyaki sauce. Soy sauce, including reduced-sodium. Steak sauce. Canned and packaged gravies. Fish sauce. Oyster sauce. Cocktail sauce. Horseradish that you find on the shelf. Ketchup. Mustard. Meat flavorings and tenderizers. Bouillon cubes. Hot sauce and Tabasco sauce. Premade or packaged marinades. Premade or packaged taco seasonings. Relishes. Regular salad dressings. Where to find more information:  National Heart, Lung, and [place]: https://wilson-eaton.com/  American Heart Association: www.heart.org Summary  The DASH eating plan is a healthy eating plan that has been shown to reduce high blood pressure (hypertension). It may also reduce your risk for type 2 diabetes, heart disease, and stroke.  With the DASH eating plan, you should limit salt (sodium) intake to 2,300 mg a day. If you have hypertension, you may need to reduce your sodium intake to 1,500 mg a day.  When on the DASH eating plan, aim to eat more fresh fruits and vegetables, whole grains, lean proteins, low-fat dairy, and heart-healthy fats.  Work with your health care provider or diet and nutrition specialist (dietitian) to adjust your eating plan to your individual calorie needs. This information is not intended to replace advice given to you by your health care provider. Make sure you discuss any questions you have with your health care provider. Document Released: 12/05/2011 Document Revised: 12/09/2016 Document Reviewed: 12/09/2016 Elsevier  Interactive Patient Education  2019 [employer].

## 2019-01-05 NOTE — Progress Notes (Signed)
Mike Frazier, is a 38 y.o. male  ZOX:[id number]:[id number]  [id number]:[id number]  DOB - 11/21/1981  CC:  Chief Complaint  Patient presents with  . Establish Care  . Hospitalization Follow-up    ED->Hospital 12/18-12/20: NSTEMI, CAD in native artery. feeling pretty good. no chest pain or SHOB. having some fatigue       HPI: Mike Frazier is a 38 y.o. male is here today to establish care.   Mike Frazier has Felon; Essential hypertension; Hypokalemia; Precordial pain; NSTEMI (non-ST elevated myocardial infarction) (HCC); Status post coronary artery stent placement; Unstable angina (HCC); Hyperlipidemia; Chest pain; CAD in native artery; Obesity; and H/O noncompliance with medical treatment, presenting hazards to health on their problem list.   Hospital follow-up  [facility] and found to have had positive troponin after experiencing several days of chest pain. Patient has a history of CAD with stent placement in 2017. He had taken himself off of his medications including his presctribe anticoagulant therapy which is thought to be related to this most recent MI. He is a current smoker.Body mass index is 33.94 kg/m. Family history  significant for cardiovascular disease with sister experiencing a MI at age less 6450. He reports current compliance with medication. He underwent a left cath which was significant for Proximal LAD 90 % stenosed. He received one drug eluting stent to the LAD. EF normal 55-65%. He is scheduled to follow-up with cariology 01/29/19. Denies shortness of breath, chest pain, activity intolerance. He is experiencing residual fatigue that is improving daily. No monitoring of blood pressure at home.   Current medications: Current Outpatient Medications:  .  amLODipine (NORVASC) 5 MG tablet, Take 1 tablet (5 mg total) by mouth daily., Disp: 30 tablet, Rfl: 6 .  aspirin 81 MG EC tablet, Take 1 tablet (81 mg total) by mouth daily., Disp: 30 tablet,  Rfl: 3 .  atorvastatin (LIPITOR) 80 MG tablet, Take 1 tablet (80 mg total) by mouth every evening., Disp: 30 tablet, Rfl: 6 .  carvedilol (COREG) 6.25 MG tablet, Take 1 tablet (6.25 mg total) by mouth 2 (two) times daily with a meal., Disp: 60 tablet, Rfl: 6 .  lisinopril (PRINIVIL,ZESTRIL) 10 MG tablet, Take 1 tablet (10 mg total) by mouth daily., Disp: 30 tablet, Rfl: 6 .  ticagrelor (BRILINTA) 90 MG TABS tablet, Take 1 tablet (90 mg total) by mouth 2 (two) times daily., Disp: 60 tablet, Rfl: 11 .  nitroGLYCERIN (NITROSTAT) 0.4 MG SL tablet, Place 1 tablet (0.4 mg total) under the tongue every 5 (five) minutes as needed for chest pain (up to 3 doses). (Patient not taking: Reported on 01/05/2019), Disp: 25 tablet, Rfl: 3   Pertinent family medical history: family history includes Heart attack in his sister; Hypertension in his maternal grandmother and mother.   Allergies  Allergen Reactions  . Codeine Shortness Of Breath    Social History   Socioeconomic History  . Marital status: Single    Spouse name: Not on file  . Number of children: Not on file  . Years of education: Not on file  . Highest education level: Not on file  Occupational History  . Not on file  Social Needs  . Financial resource strain: Not on file  . Food insecurity:    Worry: Not on file    Inability: Not on file  . Transportation needs:    Medical: Not on file    Non-medical: Not on file  Tobacco Use  . Smoking  status: Current Some Day Smoker    Packs/day: 0.40    Years: 23.00    Pack years: 9.20    Types: Cigarettes  . Smokeless tobacco: Never Used  Substance and Sexual Activity  . Alcohol use: Yes    Comment: 12/17/2018 "maybe 6 beer/smonth; if that"  . Drug use: Yes    Types: Marijuana    Comment: 12/17/2018 "maybe 2 times/wk"  . Sexual activity: Yes  Lifestyle  . Physical activity:    Days per week: Not on file    Minutes per session: Not on file  . Stress: Not on file  Relationships  . Social  connections:    Talks on phone: Not on file    Gets together: Not on file    Attends religious service: Not on file    Active member of club or organization: Not on file    Attends meetings of clubs or organizations: Not on file    Relationship status: Not on file  . Intimate partner violence:    Fear of current or ex partner: Not on file    Emotionally abused: Not on file    Physically abused: Not on file    Forced sexual activity: Not on file  Other Topics Concern  . Not on file  Social History Narrative  . Not on file    Review of Systems: Pertinent negatives listed in HPI  Objective:   Vitals:   01/05/19 1332  BP: 129/84  Pulse: 72  Resp: 17  SpO2: 98%    BP Readings from Last 3 Encounters:  01/05/19 129/84  12/18/18 (!) 142/94  12/12/18 (!) 162/105    Filed Weights   01/05/19 1332  Weight: 223 lb 3.2 oz (101.2 kg)      Physical Exam: Constitutional: Patient appears well-developed and well-nourished. No distress. HENT: Normocephalic, atraumatic, External right and left ear normal. Oropharynx is clear and moist.  Eyes: Conjunctivae and EOM are normal. PERRLA, no scleral icterus. Neck: Normal ROM. Neck supple. No JVD. No tracheal deviation. No thyromegaly. CVS: RRR, S1/S2 +, no murmurs, no gallops, no carotid bruit.  Pulmonary: Effort and breath sounds normal, no stridor, rhonchi, wheezes, rales.  Abdominal: Soft. BS +, no distension, tenderness, rebound or guarding.  Musculoskeletal: Normal range of motion. No edema and no tenderness.  Neuro: Alert. Normal muscle tone coordination. Normal gait.  Skin: Skin is warm and dry. No rash noted. Not diaphoretic. No erythema. No pallor. Psychiatric: Normal mood and affect. Behavior, judgment, thought content normal.  Lab Results (prior encounters)  Lab Results  Component Value Date   WBC 9.8 12/18/2018   HGB 14.9 12/18/2018   HCT 45.5 12/18/2018   MCV 87.5 12/18/2018   PLT 280 12/18/2018   Lab Results   Component Value Date   CREATININE 1.03 12/18/2018   BUN 9 12/18/2018   NA 138 12/18/2018   K 3.8 12/18/2018   CL 103 12/18/2018   CO2 25 12/18/2018    Lab Results  Component Value Date   HGBA1C 6.1 (H) 09/28/2016       Component Value Date/Time   CHOL 168 12/16/2018 1830   TRIG 172 (H) 12/16/2018 1830   HDL 55 12/16/2018 1830   CHOLHDL 3.1 12/16/2018 1830   VLDL 34 12/16/2018 1830   LDLCALC 79 12/16/2018 1830        Assessment and plan:  1. Encounter to establish care 2. Coronary artery disease involving coronary bypass graft of native heart without angina pectoris -Reinforced the  importance of medication compliance, improving dietary choices, reducing intake of foods rich in sodium.  asymptomatic of chest pain Continue close follow-up and management with cardiology Encouraged efforts to quit smoking  3. Essential hypertension, well controlled We have discussed target BP range and blood pressure goal. I have advised patient to check BP regularly and to call us back or report to clinic if the numbers are consistently higher than 140/90. We discussed the importance of compliance with medical therapy and DASH diet recommended, consequences of uncontrolled hypertension discussed. Stop smoking. Continue current BP medications Checking: - Thyroid Panel With TSH  4. Prediabetes, hx of prediabetes Checking Hemoglobin A1c. Start metformin if warranted.   5. Hyperlipidemia, unspecified hyperlipidemia type -Continue Lipitor   6. Smoking -encouraged smoking cessation    Orders Placed This Encounter  Procedures  . Hemoglobin A1c  . Thyroid Panel With TSH    Return in about 3 months (around 04/06/2019) for hypertension and repeat A1C.   The patient was given clear instructions to go to ER or return to medical center if symptoms don't improve, worsen or new problems develop. The patient verbalized understanding. The patient was advised  to call and obtain lab results if they  haven't heard anything from out office within 7-10 business days.  Joaquin Courts, FNP Primary Care at [facility] [address] [place] [address] [phone]: [phone]    This note has been created with Dragon speech recognition software and Paediatric nurse. Any transcriptional errors are unintentional.

## 2019-01-06 LAB — THYROID PANEL WITH TSH
Free Thyroxine Index: 1.9 (ref 1.2–4.9)
T3 Uptake Ratio: 26 % (ref 24–39)
T4, Total: 7.4 ug/dL (ref 4.5–12.0)
TSH: 0.471 u[IU]/mL (ref 0.450–4.500)

## 2019-01-06 LAB — HEMOGLOBIN A1C
ESTIMATED AVERAGE GLUCOSE: 131 mg/dL
Hgb A1c MFr Bld: 6.2 % — ABNORMAL HIGH (ref 4.8–5.6)

## 2019-01-06 NOTE — Progress Notes (Signed)
Patient notified of results & recommendations. Expressed understanding.

## 2019-01-20 MED FILL — NITROGLYCERIN 0.4 MG TAB SL: 0.4 | 25 days supply | Qty: 25 | Fill #0

## 2019-01-20 MED FILL — AMLODIPINE BESYLATE 5 MG TA: 5 | 30 days supply | Qty: 30 | Fill #0

## 2019-01-20 MED FILL — BRILINTA 90 MG TABLET: 90 | 30 days supply | Qty: 60 | Fill #0

## 2019-01-20 MED FILL — LISINOPRIL 10 MG TABS: 10 | 30 days supply | Qty: 30 | Fill #0

## 2019-01-20 MED FILL — CARVEDILOL 6.25 MG TABLET: 6.25 | 30 days supply | Qty: 60 | Fill #0

## 2019-01-22 ENCOUNTER — Other Ambulatory Visit: Payer: Self-pay

## 2019-01-22 ENCOUNTER — Telehealth: Payer: Self-pay

## 2019-01-22 MED ORDER — ATORVASTATIN CALCIUM 80 MG PO TABS: 80.0000 mg | ORAL_TABLET | Freq: Every evening | ORAL | 0 refills | Status: DC

## 2019-01-22 MED FILL — ATORVASTATIN 80 MG TABLET: 80 | 15 days supply | Qty: 15 | Fill #0

## 2019-01-22 NOTE — Telephone Encounter (Signed)
Pt called in requesting refills on Atorvastatin up until his appt at the end of this month. Please address. Thank you.

## 2019-01-22 NOTE — Telephone Encounter (Signed)
Call and spoke with patient verified medication dosage amount and pharmacy for medication to be sent. Also informed patient that it is important for him to keep upcoming appointment for continuation of medication. Patient verbalized understanding. 

## 2019-01-22 NOTE — Telephone Encounter (Signed)
Call and spoke with patient verified medication dosage amount and pharmacy for medication to be sent. Also informed patient that it is important for him to keep upcoming appointment for continuation of medication. Patient verbalized understanding.

## 2019-01-26 NOTE — Progress Notes (Signed)
Cardiology Office Note   Date:  01/29/2019   ID:  Mike Frazier, DOB October 19, 1981, MRN [id number]  PCP:  Bing Neighbors, FNP Cardiologist:  Lesleigh Noe, MD 12/12/2016 Mike Demark, PA-C   No chief complaint on file.   History of Present Illness: Mike Frazier is a 38 y.o. male with a history of NSTEMI 2017 w/ DES LAD, HTN, HLD, remote tob use, asthma  Admitted 12/18-12/20/2019 for NSTEMI s/p DES LAD (out of rx for months), d/c on Brilinta ($ may be a problem), may need to increase amlodipine if BP high, recheck Lipid/liver 6-8 weeks  [name] presents for cardiology follow up.   He has quit smoking.   Is compliant w/ meds, will be able to afford Brilinta.   He is exercising, no chest pain or SOB w/ exertion.  He is not yet lifting weights, but is walking on a treadmill.  No LE edema, no orthopnea or PND.  Prior to admission, he was having some pain and numbness at times in his thighs, was also having some chest discomfort, atypical.  The symptoms were not exertional.  However, they resolved after he got his stent.  However, on the night of admission, he developed L arm pain that improved w/ nitro>>hospital>> stent to LAD  Has not had rx this am, has not eaten yet>>takes w/ food.    He feels much better being compliant with the heart healthy lifestyle.  However, he admits there are some dietary changes he could make to get his cholesterol down.   Past Medical History:  Diagnosis Date  . Asthma   . Coronary artery disease    a. NSTEMI  s/p DES stent to LAD in 09/2016. b. NSTEMI s/p DES to LAD in 11/2018, EF 55-65%.  . H/O noncompliance with medical treatment, presenting hazards to health, due to $   . High cholesterol   . Hypertension   . Migraine    "grew out of them; sports related" (12/17/2018)  . NSTEMI (non-ST elevated myocardial infarction) (HCC) 08/2016; 12/16/2018  . Torsion, testicular     Past Surgical History:  Procedure  Laterality Date  . CARDIAC CATHETERIZATION N/A 09/30/2016   Procedure: Left Heart Cath and Coronary Angiography;  Surgeon: Lyn Records, MD;  Location: [facility] INVASIVE CV LAB;  Service: Cardiovascular;  Laterality: N/A;  . CARDIAC CATHETERIZATION N/A 09/30/2016   Procedure: Intravascular Pressure Wire/FFR Study;  Surgeon: Lyn Records, MD;  Location: [facility] INVASIVE CV LAB;  Service: Cardiovascular;  Laterality: N/A;  . CARDIAC CATHETERIZATION N/A 09/30/2016   Procedure: Coronary Stent Intervention;  Surgeon: Lyn Records, MD;  Location: [facility] INVASIVE CV LAB;  Service: Cardiovascular;  Laterality: N/A;  . CORONARY ANGIOPLASTY WITH STENT PLACEMENT  12/17/2018  . I&D EXTREMITY  10/21/2012   Procedure: IRRIGATION AND DEBRIDEMENT EXTREMITY;  Surgeon: Marlowe Shores, MD;  Location: [facility] OR;  Service: Orthopedics;  Laterality: Left;  . INCISION / DRAINAGE HAND / FINGER Right    "bit by spider twice; once on left thumb; once on right middle finger" (12/17/2018  . LEFT HEART CATH AND CORONARY ANGIOGRAPHY N/A 12/17/2018   Procedure: LEFT HEART CATH AND CORONARY ANGIOGRAPHY;  Surgeon: Runell Gess, MD;  Location: [facility] INVASIVE CV LAB;  Service: Cardiovascular;  Laterality: N/A;  . TESTICLE TORSION REDUCTION  ~ 1997    Current Outpatient Medications  Medication Sig Dispense Refill  . amLODipine (NORVASC) 5 MG tablet Take 1 tablet (5 mg total) by mouth daily. 30  tablet 6  . aspirin 81 MG EC tablet Take 1 tablet (81 mg total) by mouth daily. 30 tablet 3  . atorvastatin (LIPITOR) 80 MG tablet Take 1 tablet (80 mg total) by mouth every evening. 15 tablet 0  . carvedilol (COREG) 6.25 MG tablet Take 1 tablet (6.25 mg total) by mouth 2 (two) times daily with a meal. 60 tablet 6  . lisinopril (PRINIVIL,ZESTRIL) 10 MG tablet Take 1 tablet (10 mg total) by mouth daily. 30 tablet 6  . nitroGLYCERIN (NITROSTAT) 0.4 MG SL tablet Place 1 tablet (0.4 mg total) under the tongue every 5 (five) minutes as needed for chest  pain (up to 3 doses). 25 tablet 3  . ticagrelor (BRILINTA) 90 MG TABS tablet Take 1 tablet (90 mg total) by mouth 2 (two) times daily. 60 tablet 11   No current facility-administered medications for this visit.     Allergies:   Codeine    Social History:  The patient  reports that he quit smoking about 6 weeks ago. His smoking use included cigarettes. He has a 9.20 pack-year smoking history. He has never used smokeless tobacco. He reports current alcohol use. He reports current drug use. Drug: Marijuana.   Family History:  The patient's family history includes Heart attack in his sister; Hypertension in his maternal grandmother and mother.  He indicated that his mother is alive. He indicated that his father is alive. He indicated that the status of his sister is unknown. He indicated that his maternal grandmother is alive. He indicated that his maternal grandfather is alive. He indicated that his paternal grandmother is alive. He indicated that his paternal grandfather is alive.    ROS:  Please see the history of present illness. All other systems are reviewed and negative.    PHYSICAL EXAM: VS:  BP 134/89   Pulse 64   Ht 5\' 8"  (1.727 m)   Wt 227 lb (103 kg)   SpO2 99%   BMI 34.52 kg/m  , BMI Body mass index is 34.52 kg/m. GEN: Well nourished, well developed, male in no acute distress HEENT: normal for age  Neck: no JVD, no carotid bruit, no masses Cardiac: RRR; no murmur, no rubs, or gallops Respiratory:  clear to auscultation bilaterally, normal work of breathing GI: soft, nontender, nondistended, + BS MS: no deformity or atrophy; no edema; distal pulses are 2+ in all 4 extremities  Skin: warm and dry, no rash Neuro:  Strength and sensation are intact Psych: euthymic mood, full affect   EKG:  EKG is ordered today. The ekg ordered today demonstrates sinus rhythm, heart rate 64, inferior Q waves are new.   CATH: 12/17/2018  Prox LAD lesion is 90% stenosed.  A  drug-eluting stent was successfully placed.  Post intervention, there is a 0% residual stenosis.  The left ventricular systolic function is normal.  LV end diastolic pressure is normal.  The left ventricular ejection fraction is 55-65% by visual estimate.   Recent Labs: 12/16/2018: ALT 14 12/18/2018: BUN 9; Creatinine, Ser 1.03; Hemoglobin 14.9; Platelets 280; Potassium 3.8; Sodium 138 01/05/2019: TSH 0.471  CBC    Component Value Date/Time   WBC 9.8 12/18/2018 0123   RBC 5.20 12/18/2018 0123   HGB 14.9 12/18/2018 0123   HCT 45.5 12/18/2018 0123   PLT 280 12/18/2018 0123   MCV 87.5 12/18/2018 0123   MCH 28.7 12/18/2018 0123   MCHC 32.7 12/18/2018 0123   RDW 13.8 12/18/2018 0123   LYMPHSABS 5.5 (H) 12/16/2018  0325   MONOABS 1.1 (H) 12/16/2018 0325   EOSABS 0.3 12/16/2018 0325   BASOSABS 0.1 12/16/2018 0325   CMP Latest Ref Rng & Units 12/18/2018 12/17/2018 12/16/2018  Glucose 70 - 99 mg/dL 625(W) 95 389(H)  BUN 6 - 20 mg/dL 9 11 12   Creatinine 0.61 - 1.24 mg/dL 7.34 2.87 6.81  Sodium 135 - 145 mmol/L 138 139 134(L)  Potassium 3.5 - 5.1 mmol/L 3.8 4.3 3.4(L)  Chloride 98 - 111 mmol/L 103 105 99  CO2 22 - 32 mmol/L 25 24 27   Calcium 8.9 - 10.3 mg/dL 1.5(B) 2.6(O) 0.3(T)  Total Protein 6.5 - 8.1 g/dL - - 7.3  Total Bilirubin 0.3 - 1.2 mg/dL - - 1.0  Alkaline Phos 38 - 126 U/L - - 51  AST 15 - 41 U/L - - 18  ALT 0 - 44 U/L - - 14     Lipid Panel Lab Results  Component Value Date   CHOL 168 12/16/2018   HDL 55 12/16/2018   LDLCALC 79 12/16/2018   TRIG 172 (H) 12/16/2018   CHOLHDL 3.1 12/16/2018      Wt Readings from Last 3 Encounters:  01/29/19 227 lb (103 kg)  01/05/19 223 lb 3.2 oz (101.2 kg)  12/16/18 230 lb (104.3 kg)     Other studies Reviewed: Additional studies/ records that were reviewed today include: Office notes, hospital records and testing.  ASSESSMENT AND PLAN:  1.  Non-STEMI, subsequent episode of care: She is on appropriate therapy with  aspirin, Brilinta, high-dose statin, beta-blocker, ACE inhibitor.  No medication changes.  Make sure he has been referred to cardiac rehab. - Encourage continued compliance with diet and exercise regimen. Follow-up in 3 months and check lipids at that time.  2.  Hyperlipidemia: His goal LDL is less than 70, continue high-dose statin and recheck prior to or at follow-up.  3.  Hypertension: His blood pressure is minimally above target, but he has not had his morning medications.  If he develops symptoms related to hypotension, decrease or discontinue the amlodipine.  Continue beta-blocker and ACE inhibitor  4.  Medication noncompliance due to financial issues: He is getting insurance, and says the Marden Noble will cost him $10 or less per month.  Continue this along with his other medications.  He is encouraged to contact us if he is not going to be able to afford them.   Current medicines are reviewed at length with the patient today.  The patient does not have concerns regarding medicines.  The following changes have been made:  no change  Labs/ tests ordered today include:  No orders of the defined types were placed in this encounter.    Disposition:   FU with Lesleigh Noe, MD  Signed, Mike Demark, PA-C  01/29/2019 12:16 PM    [facility] Phone: [phone]; Fax: [phone]

## 2019-01-29 ENCOUNTER — Ambulatory Visit (INDEPENDENT_AMBULATORY_CARE_PROVIDER_SITE_OTHER): Payer: BLUE CROSS/BLUE SHIELD | Admitting: Physician Assistant

## 2019-01-29 ENCOUNTER — Encounter (INDEPENDENT_AMBULATORY_CARE_PROVIDER_SITE_OTHER): Payer: Self-pay

## 2019-01-29 ENCOUNTER — Encounter: Payer: Self-pay | Admitting: Physician Assistant

## 2019-01-29 VITALS — BP 134/89 | HR 64 | Ht 68.0 in | Wt 227.0 lb

## 2019-01-29 DIAGNOSIS — I10 Essential (primary) hypertension: Secondary | ICD-10-CM | POA: Diagnosis not present

## 2019-01-29 DIAGNOSIS — E785 Hyperlipidemia, unspecified: Secondary | ICD-10-CM

## 2019-01-29 DIAGNOSIS — Z91199 Patient's noncompliance with other medical treatment and regimen due to unspecified reason: Secondary | ICD-10-CM | POA: Insufficient documentation

## 2019-01-29 DIAGNOSIS — I214 Non-ST elevation (NSTEMI) myocardial infarction: Secondary | ICD-10-CM

## 2019-01-29 DIAGNOSIS — Z9119 Patient's noncompliance with other medical treatment and regimen: Secondary | ICD-10-CM | POA: Insufficient documentation

## 2019-01-29 NOTE — Patient Instructions (Signed)
Follow-Up: You will need a follow up appointment in 3 months. You may see Lesleigh Noe, MD or one of the following Advanced Practice Providers on your designated Care Team:  Norma Fredrickson, NP  Nada Boozer, NP  Georgie Chard, NP   Medication Instructions:  NO CHANGES- Your physician recommends that you continue on your current medications as directed. Please refer to the Current Medication list given to you today. If you need a refill on your cardiac medications before your next appointment, please call your pharmacy. Labwork: When you have labs (blood work) and your tests are completely normal, you will receive your results ONLY by MyChart Message (if you have MyChart) -OR- A paper copy in the mail.  At [facility], you and your health needs are our priority.  As part of our continuing mission to provide you with exceptional heart care, we have created designated Provider Care Teams.  These Care Teams include your primary Cardiologist (physician) and Advanced Practice Providers (APPs -  Physician Assistants and Nurse Practitioners) who all work together to provide you with the care you need, when you need it.  Thank you for choosing [facility] at [facility]!!

## 2019-02-03 NOTE — Addendum Note (Signed)
Addended by: Dorris Fetch on: 02/03/2019 10:17 AM   Modules accepted: Orders

## 2019-02-26 ENCOUNTER — Other Ambulatory Visit: Payer: Self-pay

## 2019-02-26 NOTE — Progress Notes (Signed)
Not needed

## 2019-03-15 ENCOUNTER — Other Ambulatory Visit: Payer: Self-pay | Admitting: Physician Assistant

## 2019-03-15 MED FILL — CARVEDILOL 6.25 MG TABLET: 6.25 | 30 days supply | Qty: 60 | Fill #1

## 2019-03-15 MED FILL — AMLODIPINE BESYLATE 5 MG TA: 5 | 30 days supply | Qty: 30 | Fill #1

## 2019-03-15 MED FILL — LISINOPRIL 10 MG TABS: 10 | 30 days supply | Qty: 30 | Fill #1

## 2019-03-15 MED FILL — BRILINTA 90 MG TABLET: 90 | 30 days supply | Qty: 60 | Fill #1

## 2019-03-16 MED FILL — ATORVASTATIN 80 MG TABLET: 80 | 90 days supply | Qty: 90 | Fill #0

## 2019-04-06 ENCOUNTER — Ambulatory Visit: Payer: Self-pay | Admitting: Family Medicine

## 2019-05-19 ENCOUNTER — Telehealth: Payer: Self-pay | Admitting: Cardiology

## 2019-05-19 NOTE — Telephone Encounter (Signed)
Spoke with patient who confirmed all demographics. Patient has an I-phone and My Chart. Will have vitals ready for visit.

## 2019-05-19 NOTE — Progress Notes (Signed)
Date:  05/19/2019   ID:  Mike Frazier Nussbaumer, DOB 10/22/1981, MRN [id number]     PCP:  Bing NeighborsHarris, Kimberly S, FNP  Cardiologist:  Lesleigh NoeHenry W Smith III, MD  Evaluation Performed:    Chief Complaint: Follow up for CAD, seen for Dr. Katrinka BlazingSmith   History of Present Illness:    Mike Frazier Schomburg is a 38 y.o. male with a history of CAD s/p NSTEMIs/p DES stent to LADin10/2017, hypertension,HLD, asthma, and previous tobacco. After his MI, he was seen in the office for follow up and had run out of his medicines due to cost. Brilinta was changed to Plavix at that time. He was not seen again by our service until a recent hospitalization in 11/2018 for chest pain. At that time, he initially presented to the ER 12/12/18 with chest pain worse with eating and drinking. His troponin was negative and he was discharged home. Omeprazole was added. He presented [facility] to [facility] 12/16/18 with chest pain with radiation to back and left arm with intermittent numbness and tingling in his left hand. It was improved with SL NTG but recurred so he came back to the ER. InitialEKG showed no ST changes howeverrepeat EKG showed mild ST changes in V1.Initial troponin negative, but f/u ER troponin was 0.09 so he was admitted for further eval.  He underwent a cardiac catheterization 12/17/2018 in which he had aggressive in-stent restenosis within the previously placed pLAD stent. Pt was noted to be very non-compliant with his antiplatelet therapies prior to admission. Plan was for DAPT with ASA and Brilinta. He was then seen in follow up on 01/29/2019. At that time, he had quit smoking and was noted to be compliant with his medications. He had no anginal complaints. He was needing referral to cardiac rehab. He was encouraged to continue with compliance with medications and lifestyle modifications.   Today he presents via virtual visit for follow up and is doing well.     The patient does not have symptoms concerning for  COVID-19 infection (fever, chills, cough, or new shortness of breath).   Past Medical History:  Diagnosis Date  . Asthma   . Coronary artery disease    a. NSTEMI  s/p DES stent to LAD in 09/2016. b. NSTEMI s/p DES to LAD in 11/2018, EF 55-65%.  . H/O noncompliance with medical treatment, presenting hazards to health, due to $   . High cholesterol   . Hypertension   . Migraine    "grew out of them; sports related" (12/17/2018)  . NSTEMI (non-ST elevated myocardial infarction) (HCC) 08/2016; 12/16/2018  . Torsion, testicular    Past Surgical History:  Procedure Laterality Date  . CARDIAC CATHETERIZATION N/A 09/30/2016   Procedure: Left Heart Cath and Coronary Angiography;  Surgeon: Lyn RecordsHenry W Smith, MD;  [facility];  Service: Cardiovascular;  Laterality: N/A;  . CARDIAC CATHETERIZATION N/A 09/30/2016   Procedure: Intravascular Pressure Wire/FFR Study;  Surgeon: Lyn RecordsHenry W Smith, MD;  [facility];  Service: Cardiovascular;  Laterality: N/A;  . CARDIAC CATHETERIZATION N/A 09/30/2016   Procedure: Coronary Stent Intervention;  Surgeon: Lyn RecordsHenry W Smith, MD;  [facility];  Service: Cardiovascular;  Laterality: N/A;  . CORONARY ANGIOPLASTY WITH STENT PLACEMENT  12/17/2018  . I&D EXTREMITY  10/21/2012   Procedure: IRRIGATION AND DEBRIDEMENT EXTREMITY;  Surgeon: Marlowe ShoresMatthew A Weingold, MD;  Location: [facility] OR;  Service: Orthopedics;  Laterality: Left;  . INCISION / DRAINAGE HAND / FINGER Right    "  bit by spider twice; once on left thumb; once on right middle finger" (12/17/2018  . LEFT HEART CATH AND CORONARY ANGIOGRAPHY N/A 12/17/2018   Procedure: LEFT HEART CATH AND CORONARY ANGIOGRAPHY;  Surgeon: Runell Gess, MD;  Location: [facility] INVASIVE CV LAB;  Service: Cardiovascular;  Laterality: N/A;  . TESTICLE TORSION REDUCTION  ~ 1997     No outpatient medications have been marked as taking for the 05/20/19 encounter (Appointment) with Filbert Schilder, NP.      Allergies:   Codeine   Social History   Tobacco Use  . Smoking status: Former Smoker    Packs/day: 0.40    Years: 23.00    Pack years: 9.20    Types: Cigarettes    Last attempt to quit: 12/17/2018    Years since quitting: 0.4  . Smokeless tobacco: Never Used  Substance Use Topics  . Alcohol use: Yes    Comment: 12/17/2018 "maybe 6 beer/smonth; if that"  . Drug use: Yes    Types: Marijuana    Comment: 12/17/2018 "maybe 2 times/wk"     Family Hx: The patient's family history includes Heart attack in his sister; Hypertension in his maternal grandmother and mother.  ROS:   Please see the history of present illness.     All other systems reviewed and are negative.  Prior CV studies:   The following studies were reviewed today:  Cardiac catheterization 12/17/2018:  Prox LAD lesion is 90% stenosed.  A drug-eluting stent was successfully placed.  Post intervention, there is a 0% residual stenosis.  The left ventricular systolic function is normal.  LV end diastolic pressure is normal.  The left ventricular ejection fraction is 55-65% by visual estimate.  Cardiac catheterization 09/30/2016:  A stent was not successfully placed.  Lat Ramus lesion, 100 %stenosed.  Post intervention, there is a 99% residual stenosis.  The left ventricular ejection fraction is 50-55% by visual estimate.  The left ventricular systolic function is normal.  LV end diastolic pressure is mildly elevated.  Mid LAD lesion, 75 %stenosed.  Post intervention, there is a 0% residual stenosis.  A STENT PROMUS PREM MR 2.75X16 drug eluting stent was successfully placed.    70-85% stenosis in the mid LAD with resting FFR of 0.74.  Successful drug-eluting stent implantation reducing the 85% stenosis to 0% with TIMI grade 3 flow.  Catheter-based embolic occlusion (thrombus) of a branch in the distal ramus intermedius. Once identified, the lesion was treated with wire probing which allowed  TIMI grade 1-2 flow to occur. The vessel segment was too small to dilate with the balloon.  Normal right coronary and circumflex.  Normal left ventricular systolic function. EF greater than 50%.  RECOMMENDATIONS:   Aspirin and Brilinta  Angiomax 6 hours  Subcutaneous heparin for DVT prophylaxis  Hospitalization for at least an additional 24-36 hours.  Trend cardiac markers  Phase I into cardiac rehabilitation  Labs/Other Tests and Data Reviewed:    EKG:  No ECG reviewed.  Recent Labs: 12/16/2018: ALT 14 12/18/2018: BUN 9; Creatinine, Ser 1.03; Hemoglobin 14.9; Platelets 280; Potassium 3.8; Sodium 138 01/05/2019: TSH 0.471   Recent Lipid Panel Lab Results  Component Value Date/Time   CHOL 168 12/16/2018 06:30 PM   TRIG 172 (H) 12/16/2018 06:30 PM   HDL 55 12/16/2018 06:30 PM   CHOLHDL 3.1 12/16/2018 06:30 PM   LDLCALC 79 12/16/2018 06:30 PM    Wt Readings from Last 3 Encounters:  01/29/19 227 lb (103 kg)  01/05/19 223  lb 3.2 oz (101.2 kg)  12/16/18 230 lb (104.3 kg)    Objective:    Vital Signs:  There were no vitals taken for this visit.     ASSESSMENT & PLAN:    1. CAD s/p NSTEMI w/PCI, 2017/2019: -Denies anginal symptoms -Continue ASA, Brilinta, high intensity statin, carvedilol 6.25 and lisinopril 10 -  2. HLD: -Last LDL, 79 on 12/16/2018 -Continue high intensity statin  -Needs lipid panel and LFTs today   3. HTN: -Stable,  -Continue amlodipine, BB, ACEI -Will obtain BMET to assess renal function -Last creatinine, 1.03 11/2018   4. Hx of medication non-compliance: -Reports that he has insurance now -Brilinta cost $10/month  -Encouraged to contact the office if unable to afford.     COVID-19 Education: The signs and symptoms of COVID-19 were discussed with the patient and how to seek care for testing (follow up with PCP or arrange E-visit). The importance of social distancing was discussed today.  Time:   Today, I have spent 20  minutes with the patient with telehealth technology discussing the above problems.     Medication Adjustments/Labs and Tests Ordered: Current medicines are reviewed at length with the patient today.  Concerns regarding medicines are outlined above.   Tests Ordered: No orders of the defined types were placed in this encounter.   Medication Changes: No orders of the defined types were placed in this encounter.   Disposition:    Signed, Georgie Chard, NP  05/19/2019 8:42 AM    [facility]    This encounter was created in error - please disregard.

## 2019-05-20 ENCOUNTER — Other Ambulatory Visit: Payer: Self-pay

## 2019-05-20 ENCOUNTER — Encounter: Payer: BLUE CROSS/BLUE SHIELD | Admitting: Cardiology

## 2019-05-20 ENCOUNTER — Telehealth: Payer: Self-pay | Admitting: Cardiology

## 2019-05-20 NOTE — Telephone Encounter (Signed)
New Message    Pt is calling and cant make his appt today because his Uncle passed

## 2019-05-25 NOTE — Telephone Encounter (Signed)
Attempted to reach pt to get him rescheduled with Lizabeth Leyden, NP on 05/26/2019

## 2019-06-02 NOTE — Telephone Encounter (Signed)
Attempted to reach pt to see if he was interested in a virtual visit with Lizabeth Leyden, NP on 06/07/2019

## 2019-06-14 ENCOUNTER — Ambulatory Visit: Payer: BLUE CROSS/BLUE SHIELD | Admitting: Cardiology

## 2019-06-14 ENCOUNTER — Encounter: Payer: Self-pay | Admitting: Cardiology

## 2019-06-14 ENCOUNTER — Other Ambulatory Visit: Payer: Self-pay

## 2019-06-14 VITALS — BP 150/100 | HR 64 | Ht 68.0 in | Wt 232.4 lb

## 2019-06-14 DIAGNOSIS — E785 Hyperlipidemia, unspecified: Secondary | ICD-10-CM | POA: Diagnosis not present

## 2019-06-14 DIAGNOSIS — I10 Essential (primary) hypertension: Secondary | ICD-10-CM | POA: Diagnosis not present

## 2019-06-14 DIAGNOSIS — I2510 Atherosclerotic heart disease of native coronary artery without angina pectoris: Secondary | ICD-10-CM | POA: Diagnosis not present

## 2019-06-14 LAB — COMPREHENSIVE METABOLIC PANEL
ALT: 16 IU/L (ref 0–44)
AST: 18 IU/L (ref 0–40)
Albumin/Globulin Ratio: 2.2 (ref 1.2–2.2)
Albumin: 4.6 g/dL (ref 4.0–5.0)
Alkaline Phosphatase: 62 IU/L (ref 39–117)
BUN/Creatinine Ratio: 10 (ref 9–20)
BUN: 11 mg/dL (ref 6–24)
Bilirubin Total: 0.4 mg/dL (ref 0.0–1.2)
CO2: 25 mmol/L (ref 20–29)
Calcium: 9.6 mg/dL (ref 8.7–10.2)
Chloride: 99 mmol/L (ref 96–106)
Creatinine, Ser: 1.15 mg/dL (ref 0.76–1.27)
GFR calc Af Amer: 92 mL/min/{1.73_m2} (ref >59)
GFR calc non Af Amer: 79 mL/min/{1.73_m2} (ref >59)
Globulin, Total: 2.1 g/dL (ref 1.5–4.5)
Glucose: 118 mg/dL — ABNORMAL HIGH (ref 65–99)
Potassium: 4.3 mmol/L (ref 3.5–5.2)
Sodium: 140 mmol/L (ref 134–144)
Total Protein: 6.7 g/dL (ref 6.0–8.5)

## 2019-06-14 LAB — LIPID PANEL
Chol/HDL Ratio: 2.9 ratio (ref 0.0–5.0)
Cholesterol, Total: 169 mg/dL (ref 100–199)
HDL: 59 mg/dL (ref >39)
LDL Calculated: 96 mg/dL (ref 0–99)
Triglycerides: 72 mg/dL (ref 0–149)
VLDL Cholesterol Cal: 14 mg/dL (ref 5–40)

## 2019-06-14 MED ORDER — CARVEDILOL 6.25 MG PO TABS: 6.2500 mg | ORAL_TABLET | Freq: Two times a day (BID) | ORAL | 11 refills | Status: DC

## 2019-06-14 MED ORDER — AMLODIPINE BESYLATE 5 MG PO TABS: 5.0000 mg | ORAL_TABLET | Freq: Every day | ORAL | 6 refills | Status: DC

## 2019-06-14 MED ORDER — TICAGRELOR 90 MG PO TABS: 90.0000 mg | ORAL_TABLET | Freq: Two times a day (BID) | ORAL | 11 refills | Status: DC

## 2019-06-14 MED ORDER — LISINOPRIL 20 MG PO TABS: 20.0000 mg | ORAL_TABLET | Freq: Every day | ORAL | 3 refills | Status: DC

## 2019-06-14 NOTE — Progress Notes (Signed)
Cardiology Office Note:    Date:  06/14/2019   ID:  Mike Frazier, DOB 02/02/1981, MRN [id number]  PCP:  Scot Jun, FNP  Cardiologist:  Sinclair Grooms, MD  Referring MD: Scot Jun, FNP   Chief Complaint  Patient presents with   Follow-up    CAD, HTN    History of Present Illness:    Mike Frazier is a 38 y.o. male with a past medical history significant for NSTEMI 2017 w/ DES LAD, HTN, HLD, tobacco use, asthma.   Admitted 12/18-12/20/2019 for NSTEMI s/p DES LAD. He had not been taking his medications for several months. He was discharged on Brilinta.  [name] requested to be seen in the office today to discuss his blood pressure.   He wants to know if he eats better and exercises, is there a possibility of getting off his BP meds. He has not taken his meds yet today. I told him that he will always need to be treated for hypertension and needs to keep regular follow up with his health care providers. Healthy lifestyle is very important but does not fully replace medical therapy. He notes that his mother has heart failure and pacemaker and he does not want to head that way. He reports compliance with all of his meds and has been able to obtain his Brilinta without any problems.   He notes that his BP is elevated first thing in the morning but improves after his medications. 140/100 > 132/90. DBP has been in the 80's but has not gotten down to 80.   No orthopnea, PND or edema.   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 He has been staying away from that since his stent and avoiding heavy lifting. 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   He walks around in his apartment complex with no chest discomfort or shortness of breath. He runs at times without problems.   He has gone back to smoking. He only drink alcohol very rarely.    Past Medical History:    Diagnosis Date   Asthma    Coronary artery disease    a. NSTEMI  s/p DES stent to LAD in 09/2016. b. NSTEMI s/p DES to LAD in 11/2018, EF 55-65%.   H/O noncompliance with medical treatment, presenting hazards to health, due to $    High cholesterol    Hypertension    Migraine    "grew out of them; sports related" (12/17/2018)   NSTEMI (non-ST elevated myocardial infarction) ([place]) 08/2016; 12/16/2018   Torsion, testicular     Past Surgical History:  Procedure Laterality Date   CARDIAC CATHETERIZATION N/A 09/30/2016   Procedure: Left Heart Cath and Coronary Angiography;  Surgeon: Belva Crome, MD;  Location: [facility];  Service: Cardiovascular;  Laterality: N/A;   CARDIAC CATHETERIZATION N/A 09/30/2016   Procedure: Intravascular Pressure Wire/FFR Study;  Surgeon: Belva Crome, MD;  Location: [facility];  Service: Cardiovascular;  Laterality: N/A;   CARDIAC CATHETERIZATION N/A 09/30/2016   Procedure: Coronary Stent Intervention;  Surgeon: Belva Crome, MD;  Location: [facility];  Service: Cardiovascular;  Laterality: N/A;   CORONARY ANGIOPLASTY WITH STENT PLACEMENT  12/17/2018   I&D EXTREMITY  10/21/2012   Procedure: IRRIGATION AND DEBRIDEMENT EXTREMITY;  Surgeon: Schuyler Amor, MD;  Location: [facility];  Service: Orthopedics;  Laterality: Left;   INCISION /  DRAINAGE HAND / FINGER Right    "bit by spider twice; once on left thumb; once on right middle finger" (12/17/2018   LEFT HEART CATH AND CORONARY ANGIOGRAPHY N/A 12/17/2018   Procedure: LEFT HEART CATH AND CORONARY ANGIOGRAPHY;  Surgeon: Runell Gess, MD;  Location: [facility];  Service: Cardiovascular;  Laterality: N/A;   TESTICLE TORSION REDUCTION  ~ 1997    Current Medications: Current Meds  Medication Sig   amLODipine (NORVASC) 5 MG tablet Take 1 tablet (5 mg total) by mouth daily.   aspirin 81 MG EC tablet Take 1 tablet (81 mg total) by mouth daily.   atorvastatin  (LIPITOR) 80 MG tablet TAKE 1 TABLET (80 MG TOTAL) BY MOUTH EVERY EVENING.   carvedilol (COREG) 6.25 MG tablet Take 1 tablet (6.25 mg total) by mouth 2 (two) times daily with a meal.   nitroGLYCERIN (NITROSTAT) 0.4 MG SL tablet Place 1 tablet (0.4 mg total) under the tongue every 5 (five) minutes as needed for chest pain (up to 3 doses).   ticagrelor (BRILINTA) 90 MG TABS tablet Take 1 tablet (90 mg total) by mouth 2 (two) times daily.   [DISCONTINUED] amLODipine (NORVASC) 5 MG tablet Take 1 tablet (5 mg total) by mouth daily.   [DISCONTINUED] amLODipine (NORVASC) 5 MG tablet Take 1 tablet (5 mg total) by mouth daily.   [DISCONTINUED] carvedilol (COREG) 6.25 MG tablet Take 1 tablet (6.25 mg total) by mouth 2 (two) times daily with a meal.   [DISCONTINUED] carvedilol (COREG) 6.25 MG tablet Take 1 tablet (6.25 mg total) by mouth 2 (two) times daily with a meal.   [DISCONTINUED] lisinopril (PRINIVIL,ZESTRIL) 10 MG tablet Take 1 tablet (10 mg total) by mouth daily.   [DISCONTINUED] ticagrelor (BRILINTA) 90 MG TABS tablet Take 1 tablet (90 mg total) by mouth 2 (two) times daily.     Allergies:   Codeine   Social History   Socioeconomic History   Marital status: Single    Spouse name: Not on file   Number of children: Not on file   Years of education: Not on file   Highest education level: Not on file  Occupational History   Occupation: works at [employer] market  Social Needs   Financial resource strain: Not on file   Food insecurity    Worry: Not on file    Inability: Not on file   Transportation needs    Medical: Not on file    Non-medical: Not on file  Tobacco Use   Smoking status: Former Smoker    Packs/day: 0.40    Years: 23.00    Pack years: 9.20    Types: Cigarettes    Quit date: 12/17/2018    Years since quitting: 0.4   Smokeless tobacco: Never Used  Substance and Sexual Activity   Alcohol use: Yes    Comment: 12/17/2018 "maybe 6 beer/smonth; if  that"   Drug use: Yes    Types: Marijuana    Comment: 12/17/2018 "maybe 2 times/wk"   Sexual activity: Yes  Lifestyle   Physical activity    Days per week: Not on file    Minutes per session: Not on file   Stress: Not on file  Relationships   Social connections    Talks on phone: Not on file    Gets together: Not on file    Attends religious service: Not on file    Active member of club or organization: Not on file    Attends meetings  of clubs or organizations: Not on file    Relationship status: Not on file  Other Topics Concern   Not on file  Social History Narrative   Not on file     Family History: The patient's family history includes Heart attack in his sister; Hypertension in his maternal grandmother and mother. ROS:   Please see the history of present illness.     All other systems reviewed and are negative.  EKGs/Labs/Other Studies Reviewed:    The following studies were reviewed today:  LEFT HEART CATH AND CORONARY ANGIOGRAPHY 12/17/18  Conclusion   Prox LAD lesion is 90% stenosed.  A drug-eluting stent was successfully placed.  Post intervention, there is a 0% residual stenosis.  The left ventricular systolic function is normal.  LV end diastolic pressure is normal.  The left ventricular ejection fraction is 55-65% by visual estimate.     IMPRESSION: Mr. Joycelyn ManZimmerman had aggressive in-stent restenosis within the previously placed proximal LAD stent by Dr. Katrinka BlazingSmith 2 years ago.  He was restented with a synergy drug-eluting stent postdilated to 3.1 mm.  Angiomax will continue for 4 hours full dose.  The patient was noncompliant with his antiplatelet medications prior to admission.  He will be discharged home in the morning.  Medication compliance will be reinforced.  He left the lab in stable condition.  EKG:  EKG is not ordered today.    Recent Labs: 12/18/2018: Hemoglobin 14.9; Platelets 280 01/05/2019: TSH 0.471 06/14/2019: ALT 16; BUN 11;  Creatinine, Ser 1.15; Potassium 4.3; Sodium 140   Recent Lipid Panel    Component Value Date/Time   CHOL 169 06/14/2019 1002   TRIG 72 06/14/2019 1002   HDL 59 06/14/2019 1002   CHOLHDL 2.9 06/14/2019 1002   CHOLHDL 3.1 12/16/2018 1830   VLDL 34 12/16/2018 1830   LDLCALC 96 06/14/2019 1002    Physical Exam:    VS:  BP (!) 150/100    Pulse 64    Ht 5\' 8"  (1.727 m)    Wt 232 lb 6.4 oz (105.4 kg)    SpO2 99%    BMI 35.34 kg/m     Wt Readings from Last 3 Encounters:  06/14/19 232 lb 6.4 oz (105.4 kg)  01/29/19 227 lb (103 kg)  01/05/19 223 lb 3.2 oz (101.2 kg)     Physical Exam  Constitutional: He is oriented to person, place, and time. He appears well-developed and well-nourished. No distress.  HENT:  Head: Normocephalic and atraumatic.  Neck: Normal range of motion. Neck supple. No JVD present.  Cardiovascular: Normal rate, regular rhythm, normal heart sounds and intact distal pulses. Exam reveals no gallop and no friction rub.  No murmur heard. Pulmonary/Chest: Effort normal and breath sounds normal. No respiratory distress. He has no wheezes. He has no rales.  Abdominal: Soft. Bowel sounds are normal.  Musculoskeletal: Normal range of motion.        General: No deformity or edema.  Neurological: He is alert and oriented to person, place, and time.  Skin: Skin is warm and dry.  Psychiatric: He has a normal mood and affect. His behavior is normal. Judgment and thought content normal.  Vitals reviewed.    ASSESSMENT:    1. Hyperlipidemia, unspecified hyperlipidemia type   2. Coronary artery disease involving native coronary artery of native heart without angina pectoris   3. Essential (primary) hypertension    PLAN:    In order of problems listed above:  1. CAD -S/P NSTEMI with ISS  of prior LAD stent due to not taking antiplatelet meds. Restented. -On medical therapy including aspirin, Brilinta, high intensity statin, beta blocker, ACE-I. He reports medical  compliance now.  -No anginal symptoms. He is exercising and wants to get back to some light weight training. We discussed safety and avoiding bearing down.  -Did not do cardiac rehab due to the COVID 19 restrictions -Aggressive secondary prevention including medications, diet and exercise, continued tobacco cessation.   2. Hypertension -Target BP <130/80.  -BP elevated today but pt has not taken his meds yet. At home his DBP runs >80.  -On amlodipine 5 mg, carvedilol 6.25 mg BID, lisinopril 10 mg daily. BP above goal discussed with pt.  -We had a long discussion on the importance of long term treatment of hypertension to reduce future risk of cardiac events, heart failure, stroke and kidney disease. He seems to understand the importance. -Will increase lisinopril to 20 mg daily for better BP control -Update BMet  3. Hyperlipidemia with goal LDL <70 -On high intensity statin with atorvastatin 80 mg daily -Follow up FLP, CMP today.  4. Tobacco abuse -He had quit but has been back to smoking 2-3 cigarettes per day.  -Reinforced importance of complete cessation.    Medication Adjustments/Labs and Tests Ordered: Current medicines are reviewed at length with the patient today.  Concerns regarding medicines are outlined above. Labs and tests ordered and medication changes are outlined in the patient instructions below:  Patient Instructions  Medication Instructions:  INCREASE: Lisinopril to 20 mg once a day   If you need a refill on your cardiac medications before your next appointment, please call your pharmacy.   Lab work: TODAY: CMET & LIPIDS   If you have labs (blood work) drawn today and your tests are completely normal, you will receive your results only by:  MyChart Message (if you have MyChart) OR  A paper copy in the mail If you have any lab test that is abnormal or we need to change your treatment, we will call you to review the  results.  Testing/Procedures: None  Follow-Up: At [facility], you and your health needs are our priority.  As part of our continuing mission to provide you with exceptional heart care, we have created designated Provider Care Teams.  These Care Teams include your primary Cardiologist (physician) and Advanced Practice Providers (APPs -  Physician Assistants and Nurse Practitioners) who all work together to provide you with the care you need, when you need it. You will need a follow up appointment in 6 months.  Please call our office 2 months in advance to schedule this appointment.  You may see Lesleigh Noe, MD or one of the following Advanced Practice Providers on your designated Care Team:   Norma Fredrickson, NP Nada Boozer, NP  Georgie Chard, NP  Any Other Special Instructions Will Be Listed Below (If Applicable).   Lifestyle Modifications to Prevent and Treat Heart Disease -Recommend heart healthy/Mediterranean diet, with whole grains, fruits, vegetable, fish, lean meats, nuts, and olive oil.  -Limit salt. -Recommend moderate walking, 3-5 times/week for 30-50 minutes each session. Aim for at least 150 minutes.week. Goal should be pace of 3 miles/hours, or walking 1.5 miles in 30 minutes -Recommend avoidance of tobacco products. Avoid excess alcohol. -Keep blood pressure well controlled, ideally less than 130/80.   ===============================================  Mediterranean Diet A Mediterranean diet refers to food and lifestyle choices that are based on the traditions of countries located on the [place]  Sea. This way of eating has been shown to help prevent certain conditions and improve outcomes for people who have chronic diseases, like kidney disease and heart disease. What are tips for following this plan? Lifestyle  Cook and eat meals together with your family, when possible.  Drink enough fluid to keep your urine clear or pale yellow.  Be physically active  every day. This includes: ? Aerobic exercise like running or swimming. ? Leisure activities like gardening, walking, or housework.  Get 7-8 hours of sleep each night.  If recommended by your health care provider, drink red wine in moderation. This means 1 glass a day for nonpregnant women and 2 glasses a day for men. A glass of wine equals 5 oz (150 mL). Reading food labels   Check the serving size of packaged foods. For foods such as rice and pasta, the serving size refers to the amount of cooked product, not dry.  Check the total fat in packaged foods. Avoid foods that have saturated fat or trans fats.  Check the ingredients list for added sugars, such as corn syrup. Shopping  At the grocery store, buy most of your food from the areas near the walls of the store. This includes: ? Fresh fruits and vegetables (produce). ? Grains, beans, nuts, and seeds. Some of these may be available in unpackaged forms or large amounts (in bulk). ? Fresh seafood. ? Poultry and eggs. ? Low-fat dairy products.  Buy whole ingredients instead of prepackaged foods.  Buy fresh fruits and vegetables in-season from local farmers markets.  Buy frozen fruits and vegetables in resealable bags.  If you do not have access to quality fresh seafood, buy precooked frozen shrimp or canned fish, such as tuna, salmon, or sardines.  Buy small amounts of raw or cooked vegetables, salads, or olives from the deli or salad bar at your store.  Stock your pantry so you always have certain foods on hand, such as olive oil, canned tuna, canned tomatoes, rice, pasta, and beans. Cooking  Cook foods with extra-virgin olive oil instead of using butter or other vegetable oils.  Have meat as a side dish, and have vegetables or grains as your main dish. This means having meat in small portions or adding small amounts of meat to foods like pasta or stew.  Use beans or vegetables instead of meat in common dishes like chili or  lasagna.  Experiment with different cooking methods. Try roasting or broiling vegetables instead of steaming or sauteing them.  Add frozen vegetables to soups, stews, pasta, or rice.  Add nuts or seeds for added healthy fat at each meal. You can add these to yogurt, salads, or vegetable dishes.  Marinate fish or vegetables using olive oil, lemon juice, garlic, and fresh herbs. Meal planning   Plan to eat 1 vegetarian meal one day each week. Try to work up to 2 vegetarian meals, if possible.  Eat seafood 2 or more times a week.  Have healthy snacks readily available, such as: ? Vegetable sticks with hummus. ? [place] yogurt. ? Fruit and nut trail mix.  Eat balanced meals throughout the week. This includes: ? Fruit: 2-3 servings a day ? Vegetables: 4-5 servings a day ? Low-fat dairy: 2 servings a day ? Fish, poultry, or lean meat: 1 serving a day ? Beans and legumes: 2 or more servings a week ? Nuts and seeds: 1-2 servings a day ? Whole grains: 6-8 servings a day ? Extra-virgin olive oil: 3-4 servings a  day  Limit red meat and sweets to only a few servings a month What are my food choices?  Mediterranean diet ? Recommended ? Grains: Whole-grain pasta. Brown rice. Bulgar wheat. Polenta. Couscous. Whole-wheat bread. [place]pah Cobbatmeal. Quinoa. ? Vegetables: Artichokes. Beets. Broccoli. Cabbage. Carrots. Eggplant. Green beans. Chard. Kale. Spinach. Onions. Leeks. Peas. Squash. Tomatoes. Peppers. Radishes. ? Fruits: Apples. Apricots. Avocado. Berries. Bananas. Cherries. Dates. Figs. Grapes. Lemons. Melon. Oranges. Peaches. Plums. Pomegranate. ? Meats and other protein foods: Beans. Almonds. Sunflower seeds. Pine nuts. Peanuts. Cod. Salmon. Scallops. Shrimp. Tuna. Tilapia. Clams. Oysters. Eggs. ? Dairy: Low-fat milk. Cheese. Greek yogurt. ? Beverages: Water. Red wine. Herbal tea. ? Fats and oils: Extra virgin olive oil. Avocado oil. Grape seed oil. ? Sweets and desserts: [place] yogurt with  honey. Baked apples. Poached pears. Trail mix. ? Seasoning and other foods: Basil. Cilantro. Coriander. Cumin. Mint. Parsley. Sage. Rosemary. Tarragon. Garlic. Oregano. Thyme. Pepper. Balsalmic vinegar. Tahini. Hummus. Tomato sauce. Olives. Mushrooms. ? Limit these ? Grains: Prepackaged pasta or rice dishes. Prepackaged cereal with added sugar. ? Vegetables: Deep fried potatoes (french fries). ? Fruits: Fruit canned in syrup. ? Meats and other protein foods: Beef. Pork. Lamb. Poultry with skin. Hot dogs. Tomasa BlaseBacon. ? Dairy: Ice cream. Sour cream. Whole milk. ? Beverages: Juice. Sugar-sweetened soft drinks. Beer. Liquor and spirits. ? Fats and oils: Butter. Canola oil. Vegetable oil. Beef fat (tallow). Lard. ? Sweets and desserts: Cookies. Cakes. Pies. Candy. ? Seasoning and other foods: Mayonnaise. Premade sauces and marinades. ? The items listed may not be a complete list. Talk with your dietitian about what dietary choices are right for you. Summary  The Mediterranean diet includes both food and lifestyle choices.  Eat a variety of fresh fruits and vegetables, beans, nuts, seeds, and whole grains.  Limit the amount of red meat and sweets that you eat.  Talk with your health care provider about whether it is safe for you to drink red wine in moderation. This means 1 glass a day for nonpregnant women and 2 glasses a day for men. A glass of wine equals 5 oz (150 mL). This information is not intended to replace advice given to you by your health care provider. Make sure you discuss any questions you have with your health care provider. Document Released: 08/08/2016 Document Revised: 09/10/2016 Document Reviewed: 08/08/2016 Elsevier Interactive Patient Education  [address]2019 [employer].     Signed, Berton BonJanine Jenafer Winterton, NP  06/14/2019 4:47 PM    [facility]

## 2019-06-14 NOTE — Patient Instructions (Addendum)
Medication Instructions:  INCREASE: Lisinopril to 20 mg once a day   If you need a refill on your cardiac medications before your next appointment, please call your pharmacy.   Lab work: TODAY: CMET & LIPIDS   If you have labs (blood work) drawn today and your tests are completely normal, you will receive your results only by: Marland Kitchen. MyChart Message (if you have MyChart) OR . A paper copy in the mail If you have any lab test that is abnormal or we need to change your treatment, we will call you to review the results.  Testing/Procedures: None  Follow-Up: At [facility], you and your health needs are our priority.  As part of our continuing mission to provide you with exceptional heart care, we have created designated Provider Care Teams.  These Care Teams include your primary Cardiologist (physician) and Advanced Practice Providers (APPs -  Physician Assistants and Nurse Practitioners) who all work together to provide you with the care you need, when you need it. You will need a follow up appointment in 6 months.  Please call our office 2 months in advance to schedule this appointment.  You may see Lesleigh NoeHenry W Smith III, MD or one of the following Advanced Practice Providers on your designated Care Team:   Norma FredricksonLori Gerhardt, NP Nada BoozerLaura Ingold, NP . Georgie ChardJill McDaniel, NP  Any Other Special Instructions Will Be Listed Below (If Applicable).   Lifestyle Modifications to Prevent and Treat Heart Disease -Recommend heart healthy/Mediterranean diet, with whole grains, fruits, vegetable, fish, lean meats, nuts, and olive oil.  -Limit salt. -Recommend moderate walking, 3-5 times/week for 30-50 minutes each session. Aim for at least 150 minutes.week. Goal should be pace of 3 miles/hours, or walking 1.5 miles in 30 minutes -Recommend avoidance of tobacco products. Avoid excess alcohol. -Keep blood pressure well controlled, ideally less than 130/80.   ===============================================   Mediterranean Diet A Mediterranean diet refers to food and lifestyle choices that are based on the traditions of countries located on the [place]. This way of eating has been shown to help prevent certain conditions and improve outcomes for people who have chronic diseases, like kidney disease and heart disease. What are tips for following this plan? Lifestyle  Cook and eat meals together with your family, when possible.  Drink enough fluid to keep your urine clear or pale yellow.  Be physically active every day. This includes: ? Aerobic exercise like running or swimming. ? Leisure activities like gardening, walking, or housework.  Get 7-8 hours of sleep each night.  If recommended by your health care provider, drink red wine in moderation. This means 1 glass a day for nonpregnant women and 2 glasses a day for men. A glass of wine equals 5 oz (150 mL). Reading food labels   Check the serving size of packaged foods. For foods such as rice and pasta, the serving size refers to the amount of cooked product, not dry.  Check the total fat in packaged foods. Avoid foods that have saturated fat or trans fats.  Check the ingredients list for added sugars, such as corn syrup. Shopping  At the grocery store, buy most of your food from the areas near the walls of the store. This includes: ? Fresh fruits and vegetables (produce). ? Grains, beans, nuts, and seeds. Some of these may be available in unpackaged forms or large amounts (in bulk). ? Fresh seafood. ? Poultry and eggs. ? Low-fat dairy products.  Buy whole ingredients instead of prepackaged  foods.  Buy fresh fruits and vegetables in-season from local farmers markets.  Buy frozen fruits and vegetables in resealable bags.  If you do not have access to quality fresh seafood, buy precooked frozen shrimp or canned fish, such as tuna, salmon, or sardines.  Buy small amounts of raw or cooked vegetables, salads, or olives from  the deli or salad bar at your store.  Stock your pantry so you always have certain foods on hand, such as olive oil, canned tuna, canned tomatoes, rice, pasta, and beans. Cooking  Cook foods with extra-virgin olive oil instead of using butter or other vegetable oils.  Have meat as a side dish, and have vegetables or grains as your main dish. This means having meat in small portions or adding small amounts of meat to foods like pasta or stew.  Use beans or vegetables instead of meat in common dishes like chili or lasagna.  Experiment with different cooking methods. Try roasting or broiling vegetables instead of steaming or sauteing them.  Add frozen vegetables to soups, stews, pasta, or rice.  Add nuts or seeds for added healthy fat at each meal. You can add these to yogurt, salads, or vegetable dishes.  Marinate fish or vegetables using olive oil, lemon juice, garlic, and fresh herbs. Meal planning   Plan to eat 1 vegetarian meal one day each week. Try to work up to 2 vegetarian meals, if possible.  Eat seafood 2 or more times a week.  Have healthy snacks readily available, such as: ? Vegetable sticks with hummus. ? [place] yogurt. ? Fruit and nut trail mix.  Eat balanced meals throughout the week. This includes: ? Fruit: 2-3 servings a day ? Vegetables: 4-5 servings a day ? Low-fat dairy: 2 servings a day ? Fish, poultry, or lean meat: 1 serving a day ? Beans and legumes: 2 or more servings a week ? Nuts and seeds: 1-2 servings a day ? Whole grains: 6-8 servings a day ? Extra-virgin olive oil: 3-4 servings a day  Limit red meat and sweets to only a few servings a month What are my food choices?  Mediterranean diet ? Recommended ? Grains: Whole-grain pasta. Brown rice. Bulgar wheat. Polenta. Couscous. Whole-wheat bread. Orpah Cobbatmeal. Quinoa. ? Vegetables: Artichokes. Beets. Broccoli. Cabbage. Carrots. Eggplant. Green beans. Chard. Kale. Spinach. Onions. Leeks. Peas. Squash.  Tomatoes. Peppers. Radishes. ? Fruits: Apples. Apricots. Avocado. Berries. Bananas. Cherries. Dates. Figs. Grapes. Lemons. Melon. Oranges. Peaches. Plums. Pomegranate. ? Meats and other protein foods: Beans. Almonds. Sunflower seeds. Pine nuts. Peanuts. Cod. Salmon. Scallops. Shrimp. Tuna. Tilapia. Clams. Oysters. Eggs. ? Dairy: Low-fat milk. Cheese. Greek yogurt. ? Beverages: Water. Red wine. Herbal tea. ? Fats and oils: Extra virgin olive oil. Avocado oil. Grape seed oil. ? Sweets and desserts: [place] yogurt with honey. Baked apples. Poached pears. Trail mix. ? Seasoning and other foods: Basil. Cilantro. Coriander. Cumin. Mint. Parsley. Sage. Rosemary. Tarragon. Garlic. Oregano. Thyme. Pepper. Balsalmic vinegar. Tahini. Hummus. Tomato sauce. Olives. Mushrooms. ? Limit these ? Grains: Prepackaged pasta or rice dishes. Prepackaged cereal with added sugar. ? Vegetables: Deep fried potatoes (french fries). ? Fruits: Fruit canned in syrup. ? Meats and other protein foods: Beef. Pork. Lamb. Poultry with skin. Hot dogs. Tomasa BlaseBacon. ? Dairy: Ice cream. Sour cream. Whole milk. ? Beverages: Juice. Sugar-sweetened soft drinks. Beer. Liquor and spirits. ? Fats and oils: Butter. Canola oil. Vegetable oil. Beef fat (tallow). Lard. ? Sweets and desserts: Cookies. Cakes. Pies. Candy. ? Seasoning and other foods: Mayonnaise. Premade sauces  and marinades. ? The items listed may not be a complete list. Talk with your dietitian about what dietary choices are right for you. Summary  The Mediterranean diet includes both food and lifestyle choices.  Eat a variety of fresh fruits and vegetables, beans, nuts, seeds, and whole grains.  Limit the amount of red meat and sweets that you eat.  Talk with your health care provider about whether it is safe for you to drink red wine in moderation. This means 1 glass a day for nonpregnant women and 2 glasses a day for men. A glass of wine equals 5 oz (150 mL). This information  is not intended to replace advice given to you by your health care provider. Make sure you discuss any questions you have with your health care provider. Document Released: 08/08/2016 Document Revised: 09/10/2016 Document Reviewed: 08/08/2016 Elsevier Interactive Patient Education  2019 [employer].

## 2019-06-21 ENCOUNTER — Telehealth: Payer: Self-pay

## 2019-06-21 DIAGNOSIS — E785 Hyperlipidemia, unspecified: Secondary | ICD-10-CM

## 2019-06-21 NOTE — Telephone Encounter (Signed)
-----   Message from Daune Perch, NP sent at 06/18/2019  1:24 PM EDT ----- Yes that would be good. About 8 weeks if fine.  ----- Message ----- From: Mendel Ryder, CMA Sent: 06/18/2019  12:50 PM EDT To: Daune Perch, NP  Pt is aware of results. Pt verbalized understanding. Pt states that he had not been taking his atorvastatin consistently. Pt states that since his office visit with you he has started to take his medications daily. Would you like for me to make him a lab appt to have his lipids rechecked in about a month ? Or 6 weeks ?

## 2019-06-24 MED FILL — CARVEDILOL 6.25 MG TABLET: 6.25 | 30 days supply | Qty: 60 | Fill #0

## 2019-06-24 MED FILL — BRILINTA 90 MG TABLET: 90 | 30 days supply | Qty: 60 | Fill #0

## 2019-06-24 MED FILL — AMLODIPINE BESYLATE 5 MG TA: 5 | 30 days supply | Qty: 30 | Fill #0

## 2019-06-24 MED FILL — LISINOPRIL 20 MG TABLET: 20 | 30 days supply | Qty: 30 | Fill #0

## 2019-08-16 ENCOUNTER — Other Ambulatory Visit: Payer: BLUE CROSS/BLUE SHIELD

## 2019-09-22 MED FILL — LISINOPRIL 20 MG TABLET: 20 | 30 days supply | Qty: 30 | Fill #1

## 2019-09-22 MED FILL — AMLODIPINE BESYLATE 5 MG TA: 5 | 30 days supply | Qty: 30 | Fill #1

## 2019-09-22 MED FILL — BRILINTA 90 MG TABLET: 90 | 30 days supply | Qty: 60 | Fill #0

## 2019-12-09 MED FILL — LISINOPRIL 20 MG TABLET: 20 | 30 days supply | Qty: 30 | Fill #2

## 2019-12-09 MED FILL — CARVEDILOL 6.25 MG TABLET: 6.25 | 30 days supply | Qty: 60 | Fill #1

## 2019-12-09 MED FILL — AMLODIPINE BESYLATE 5 MG TA: 5 | 30 days supply | Qty: 30 | Fill #2

## 2020-01-19 ENCOUNTER — Encounter: Payer: Self-pay | Admitting: Physician Assistant

## 2020-01-19 NOTE — Progress Notes (Signed)
Cardiology Office Note     Date:  01/20/2020   ID:  Mike Frazier, DOB Jan 08, 1981, MRN [id number]  PCP:  Scot Jun, FNP  Cardiologist:  Sinclair Grooms, MD  Electrophysiologist:  None   Chief Complaint: 6 month f/u CAD, also discuss blood pressure, headaches  History of Present Illness:   Mike Frazier is a 40 y.o. male with history of CAD (NSTEMI 2017 w/ DES to LAD, NSTEMI 11/2018 s/p DES to LAD), HTN, HLD, pre-diabetes, tobaccouse, asthma, obesity who presents for 6 month f/u. He had previous NSTEMI/cath in 2017 as mentioned. When he followed up after that admission he had run out of his medications. Brilinta was changed to Plavix. Following this, there was another lapse in follow-up when he had run out of his medicines for several months. He had them refilled but had not been taking them long before he was readmitted 11/2018 with another NSTEMI (troponin peak 0.25). He underwent cath showing 90% prox LAD, LVEDP normal, EF 55-65%. He was seen in follow-up in 2020 with elevated blood pressure. Lisinopril was increased. Last outside labs personally reviewed 05/2019 showed K 4.3, Cr 1.15, glucose 118, LDL 96, 12/2018 TSH wnl, A1C 6.2, 11/2018 CBC wnl.  He is seen today for 6 month cardiology follow-up. In general he feels like he has been doing well from cardiac standpoint without CP, SOB, dizziness, syncope. He reports compliance with all medications. Most notably however, he has noticed the last few days he's had an dull left frontal headache that comes and goes. He also reports feeling generalized fatigue and noticed some nasal congestion starting approximately 4 days ago. He denies any loss of sense of taste or smell, but does note this morning he bit into a chicken biscuit and it tasted disgusting. He also has had occasional cold sweats the last few nights. He denies any fever or known sick contacts. He also noticed his blood pressure running high when he would check it  occasionally, such as 163/95. It was 130/90 on arrival here, rechecked by me 130/82.     Past Medical History:  Diagnosis Date  . Asthma   . Coronary artery disease    a. NSTEMI  s/p DES stent to LAD in 09/2016. b. NSTEMI s/p DES to LAD in 11/2018, EF 55-65%.  . H/O noncompliance with medical treatment, presenting hazards to health, due to $   . High cholesterol   . Hypertension   . Migraine    "grew out of them; sports related" (12/17/2018)  . NSTEMI (non-ST elevated myocardial infarction) ([place]) 08/2016; 12/16/2018  . Pre-diabetes   . Torsion, testicular     Past Surgical History:  Procedure Laterality Date  . CARDIAC CATHETERIZATION N/A 09/30/2016   Procedure: Left Heart Cath and Coronary Angiography;  Surgeon: Belva Crome, MD;  Location: [facility];  Service: Cardiovascular;  Laterality: N/A;  . CARDIAC CATHETERIZATION N/A 09/30/2016   Procedure: Intravascular Pressure Wire/FFR Study;  Surgeon: Belva Crome, MD;  Location: [facility];  Service: Cardiovascular;  Laterality: N/A;  . CARDIAC CATHETERIZATION N/A 09/30/2016   Procedure: Coronary Stent Intervention;  Surgeon: Belva Crome, MD;  Location: [facility];  Service: Cardiovascular;  Laterality: N/A;  . CORONARY ANGIOPLASTY WITH STENT PLACEMENT  12/17/2018  . I & D EXTREMITY  10/21/2012   Procedure: IRRIGATION AND DEBRIDEMENT EXTREMITY;  Surgeon: Schuyler Amor, MD;  Location: [facility];  Service: Orthopedics;  Laterality: Left;  . INCISION /  DRAINAGE HAND / FINGER Right    "bit by spider twice; once on left thumb; once on right middle finger" (12/17/2018  . LEFT HEART CATH AND CORONARY ANGIOGRAPHY N/A 12/17/2018   Procedure: LEFT HEART CATH AND CORONARY ANGIOGRAPHY;  Surgeon: Lorretta Harp, MD;  Location: [facility];  Service: Cardiovascular;  Laterality: N/A;  . TESTICLE TORSION REDUCTION  ~ 1997    Current Medications: Current Meds  Medication Sig  . amLODipine (NORVASC) 5 MG tablet  Take 1 tablet (5 mg total) by mouth daily.  Marland Kitchen aspirin 81 MG EC tablet Take 1 tablet (81 mg total) by mouth daily.  Marland Kitchen atorvastatin (LIPITOR) 80 MG tablet TAKE 1 TABLET (80 MG TOTAL) BY MOUTH EVERY EVENING.  . carvedilol (COREG) 6.25 MG tablet Take 1 tablet (6.25 mg total) by mouth 2 (two) times daily with a meal.  . lisinopril (ZESTRIL) 20 MG tablet Take 1 tablet (20 mg total) by mouth daily.  . nitroGLYCERIN (NITROSTAT) 0.4 MG SL tablet Place 1 tablet (0.4 mg total) under the tongue every 5 (five) minutes as needed for chest pain (up to 3 doses).  . ticagrelor (BRILINTA) 90 MG TABS tablet Take 1 tablet (90 mg total) by mouth 2 (two) times daily.     Allergies:   Codeine   Social History   Socioeconomic History  . Marital status: Single    Spouse name: Not on file  . Number of children: Not on file  . Years of education: Not on file  . Highest education level: Not on file  Occupational History  . Occupation: works at [employer]  . Smoking status: Former Smoker    Packs/day: 0.40    Years: 23.00    Pack years: 9.20    Types: Cigarettes    Quit date: 12/17/2018    Years since quitting: 1.0  . Smokeless tobacco: Never Used  Substance and Sexual Activity  . Alcohol use: Yes    Comment: 12/17/2018 "maybe 6 beer/smonth; if that"  . Drug use: Yes    Types: Marijuana    Comment: 12/17/2018 "maybe 2 times/wk"  . Sexual activity: Yes  Other Topics Concern  . Not on file  Social History Narrative  . Not on file   Social Determinants of Health   Financial Resource Strain:   . Difficulty of Paying Living Expenses: Not on file  Food Insecurity:   . Worried About Charity fundraiser in the Last Year: Not on file  . Ran Out of Food in the Last Year: Not on file  Transportation Needs:   . Lack of Transportation (Medical): Not on file  . Lack of Transportation (Non-Medical): Not on file  Physical Activity:   . Days of Exercise per Week: Not on file  . Minutes  of Exercise per Session: Not on file  Stress:   . Feeling of Stress : Not on file  Social Connections:   . Frequency of Communication with Friends and Family: Not on file  . Frequency of Social Gatherings with Friends and Family: Not on file  . Attends Religious Services: Not on file  . Active Member of Clubs or Organizations: Not on file  . Attends Archivist Meetings: Not on file  . Marital Status: Not on file     Family History:  The patient's family history includes Heart attack in his sister; Hypertension in his maternal grandmother and mother.  ROS:   Please see the history of  present illness.  All other systems are reviewed and otherwise negative.    EKGs/Labs/Other Studies Reviewed:    Studies reviewed are outlined and summarized above. Reports may be copied below with additional information if pertinent.  Cardiac Cath 11/2018  Prox LAD lesion is 90% stenosed.  A drug-eluting stent was successfully placed.  Post intervention, there is a 0% residual stenosis.  The left ventricular systolic function is normal.  LV end diastolic pressure is normal.  The left ventricular ejection fraction is 55-65% by visual estimate. PHYSICIAN: Quay Burow, M.D.  PROCEDURE DESCRIPTION:   The patient was brought to the second floor [facility] cath lab in the postabsorptive state. He was premedicated with IV Versed and fentanyl. His right wrist was prepped and shaved in usual sterile fashion. Xylocaine 1% was used for local anesthesia. A 6 French sheath was inserted into the radial artery using standard Seldinger technique. The patient received 5000 units  of heparin intravenously.  A 5 [place] TIG catheter and pigtail catheters were used for selective coronary angiography and left ventriculography respectively.  Isovue dye was used for the entirety of the case.  Retrograde aorta, left ventricular and pullback pressures were recorded.  Radial cocktail was administered  via the SideArm sheath.  The patient received Brilinta 180 mg p.o. followed by Angiomax bolus and infusion with a therapeutic ACT.  Using a 6 [place] XB LAD 3.5 cm guide catheter along with a 0.14 pro-water guidewire a 2 mm x 12 mm balloon the proximal LAD in-stent restenosis was predilated.  Following this a 2.75 mm x 24 mm long Synergy drug-eluting stent was then carefully positioned to include the previously placed stent and deployed at 16 atm.  The entire resected segment was postdilated with a 3 mm x 15 mm long noncompliant balloon at 16 atm (3.1 mm) resulting reduction of a 90% "in-stent restenosis to 0% residual.  The patient tolerated procedure well.  The guidewire and catheter were removed.  The sheath was removed and a TR band was placed on the right wrist to achieve patent hemostasis.  The patient left the lab in stable condition.    IMPRESSION: Mr. 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 Tamala Julian 2 years ago.  He was restented with a synergy drug-eluting stent postdilated to 3.1 mm.  Angiomax will continue for 4 hours full dose.  The patient was noncompliant with his antiplatelet medications prior to admission.  He will be discharged home in the morning.  Medication compliance will be reinforced.  He left the lab in stable condition.  Quay Burow. MD, [facility] 12/17/2018 11:44 AM  2D echo 09/2016 Study Conclusions  - Left ventricle: The cavity size was normal. Posterior wall   thickness was increased in a pattern of mild LVH. Systolic   function was vigorous. The estimated ejection fraction was in the   range of 65% to 70%. Wall motion was normal; there were no   regional wall motion abnormalities. Left ventricular diastolic   function parameters were normal. - Aortic valve: Transvalvular velocity was within the normal range.   There was no stenosis. There was no regurgitation. - Mitral valve: Transvalvular velocity was  within the normal range.   There was no evidence for stenosis. There was no regurgitation. - Right ventricle: The cavity size was normal. Wall thickness was   normal. Systolic function was normal. - Tricuspid valve: There was no regurgitation.     EKG:  EKG is ordered  today, personally reviewed, demonstrating NSR 60bpm, NSST changes, prior inferior infarct, nonacute  Recent Labs: 06/14/2019: ALT 16; BUN 11; Creatinine, Ser 1.15; Potassium 4.3; Sodium 140  Recent Lipid Panel    Component Value Date/Time   CHOL 169 06/14/2019 1002   TRIG 72 06/14/2019 1002   HDL 59 06/14/2019 1002   CHOLHDL 2.9 06/14/2019 1002   CHOLHDL 3.1 12/16/2018 1830   VLDL 34 12/16/2018 1830   LDLCALC 96 06/14/2019 1002    PHYSICAL EXAM:    VS:  BP 130/90   Pulse 60   Ht _0  (1.727 m)   Wt 223 lb 12.8 oz (101.5 kg)   SpO2 98%   BMI 34.03 kg/m   BMI: Body mass index is 34.03 kg/m.  GEN: Well nourished, well developed AAM, in no acute distress HEENT: normocephalic, atraumatic Neck: no JVD, carotid bruits, or masses Cardiac: RRR; no murmurs, rubs, or gallops, no edema  Respiratory:  clear to auscultation bilaterally, normal work of breathing GI: soft, nontender, nondistended, + BS MS: no deformity or atrophy Skin: warm and dry, no rash Neuro:  Alert and Oriented x 3, Strength and sensation are intact, follows commands, nonfocal Psych: euthymic mood, full affect  Patient was examined wearing full PPE including gown, N95 with surgical mask over it, face shield and gloves  Wt Readings from Last 3 Encounters:  01/20/20 223 lb 12.8 oz (101.5 kg)  06/14/19 232 lb 6.4 oz (105.4 kg)  01/29/19 227 lb (103 kg)     ASSESSMENT & PLAN:   1. Headache - from cardiac standpoint he appears stable. However, given the constellation of cold sweats, headache, and nasal congestion I am concerned he may have Covid. He appears nontoxic, is oxygenating well without SOB or cough and lungs are clear. We do not offer  Covid testing in our office so gave him information on the resource number to message to obtain testing. I have advised him to self-quarantine until results and to follow up with primary care if symptoms persist. He has no other acute neurological symptoms. We discussed he may use Tylenol for his headache. Since we are getting his CMET/lipid profile we will go ahead and check CBC and TSH for completeness. 2. CAD - symptomatically stable. Last PCI was >12 months ago. I will reach out to Dr. Tamala Julian to find out long-term plan for DAPT (remains on ASA + Brilinta). 3. Essential HTN - he reports episodic elevated blood pressures at home but blood pressure here was 130/82. He has not yet taken his BP medications either. Will have him follow his blood pressure at home once daily for the next several days and contact us with results, particularly if running >196 systolic or >22 diastolic. 4. Hyperlipidemia - due for lipids and CMET today, will obtain. 5. Tobacco abuse - former use noted; continue abstinence.  Disposition: F/u with Dr. Tamala Julian in 6 months.  Medication Adjustments/Labs and Tests Ordered: Current medicines are reviewed at length with the patient today.  Concerns regarding medicines are outlined above. Medication changes, Labs and Tests ordered today are summarized above and listed in the Patient Instructions accessible in Encounters.   Signed, Charlie Pitter, PA-C  01/20/2020 10:03 AM    [address], [place], [place]  [address] Phone: [phone]; Fax: [phone]

## 2020-01-20 ENCOUNTER — Ambulatory Visit (INDEPENDENT_AMBULATORY_CARE_PROVIDER_SITE_OTHER): Payer: Self-pay | Admitting: Physician Assistant

## 2020-01-20 ENCOUNTER — Encounter: Payer: Self-pay | Admitting: Physician Assistant

## 2020-01-20 ENCOUNTER — Other Ambulatory Visit: Payer: Self-pay

## 2020-01-20 VITALS — BP 130/90 | HR 60 | Ht 68.0 in | Wt 223.8 lb

## 2020-01-20 DIAGNOSIS — E785 Hyperlipidemia, unspecified: Secondary | ICD-10-CM

## 2020-01-20 DIAGNOSIS — R519 Headache, unspecified: Secondary | ICD-10-CM

## 2020-01-20 DIAGNOSIS — I10 Essential (primary) hypertension: Secondary | ICD-10-CM

## 2020-01-20 DIAGNOSIS — Z720 Tobacco use: Secondary | ICD-10-CM

## 2020-01-20 DIAGNOSIS — I2510 Atherosclerotic heart disease of native coronary artery without angina pectoris: Secondary | ICD-10-CM

## 2020-01-20 LAB — COMPREHENSIVE METABOLIC PANEL
ALT: 16 IU/L (ref 0–44)
AST: 16 IU/L (ref 0–40)
Albumin/Globulin Ratio: 2.2 (ref 1.2–2.2)
Albumin: 4.9 g/dL (ref 4.0–5.0)
Alkaline Phosphatase: 68 IU/L (ref 39–117)
BUN/Creatinine Ratio: 10 (ref 9–20)
BUN: 11 mg/dL (ref 6–24)
Bilirubin Total: 1.2 mg/dL (ref 0.0–1.2)
CO2: 23 mmol/L (ref 20–29)
Calcium: 9.5 mg/dL (ref 8.7–10.2)
Chloride: 101 mmol/L (ref 96–106)
Creatinine, Ser: 1.05 mg/dL (ref 0.76–1.27)
GFR calc Af Amer: 102 mL/min/{1.73_m2} (ref >59)
GFR calc non Af Amer: 88 mL/min/{1.73_m2} (ref >59)
Globulin, Total: 2.2 g/dL (ref 1.5–4.5)
Glucose: 142 mg/dL — ABNORMAL HIGH (ref 65–99)
Potassium: 4.5 mmol/L (ref 3.5–5.2)
Sodium: 139 mmol/L (ref 134–144)
Total Protein: 7.1 g/dL (ref 6.0–8.5)

## 2020-01-20 LAB — CBC
Hematocrit: 50.7 % (ref 37.5–51.0)
Hemoglobin: 17 g/dL (ref 13.0–17.7)
MCH: 28.8 pg (ref 26.6–33.0)
MCHC: 33.5 g/dL (ref 31.5–35.7)
MCV: 86 fL (ref 79–97)
Platelets: 272 10*3/uL (ref 150–450)
RBC: 5.91 x10E6/uL — ABNORMAL HIGH (ref 4.14–5.80)
RDW: 12.8 % (ref 11.6–15.4)
WBC: 4 10*3/uL (ref 3.4–10.8)

## 2020-01-20 LAB — LIPID PANEL
Chol/HDL Ratio: 2.8 ratio (ref 0.0–5.0)
Cholesterol, Total: 137 mg/dL (ref 100–199)
HDL: 49 mg/dL (ref >39)
LDL Chol Calc (NIH): 69 mg/dL (ref 0–99)
Triglycerides: 105 mg/dL (ref 0–149)
VLDL Cholesterol Cal: 19 mg/dL (ref 5–40)

## 2020-01-20 LAB — TSH: TSH: 0.512 u[IU]/mL (ref 0.450–4.500)

## 2020-01-20 NOTE — Patient Instructions (Addendum)
Medication Instructions:  Your physician recommends that you continue on your current medications as directed. Please refer to the Current Medication list given to you today.  *If you need a refill on your cardiac medications before your next appointment, please call your pharmacy*  Lab Work: TODAY:  CMET, CBC, TSH, * LIPID  If you have labs (blood work) drawn today and your tests are completely normal, you will receive your results only by: Marland Kitchen MyChart Message (if you have MyChart) OR . A paper copy in the mail If you have any lab test that is abnormal or we need to change your treatment, we will call you to review the results.  Testing/Procedures: None   Follow-Up: At [facility], you and your health needs are our priority.  As part of our continuing mission to provide you with exceptional heart care, we have created designated Provider Care Teams.  These Care Teams include your primary Cardiologist (physician) and Advanced Practice Providers (APPs -  Physician Assistants and Nurse Practitioners) who all work together to provide you with the care you need, when you need it.  Your next appointment:   6 month(s)  The format for your next appointment:   In Person  Provider:   You may see Lesleigh Noe, MD or one of the following Advanced Practice Providers on your designated Care Team:    Norma Fredrickson, NP  Nada Boozer, NP  Georgie Chard, NP   Other Instructions  We would recommend you go get Covid tested today. Please self-quarantine in accordance with local guidelines until you get the results. Please reach out to your primary care provider if symptoms persist.  Please check your blood pressure once daily over the next week. To check your blood pressure, choose a time about 3 hours after taking your blood pressure medicines. Remain seated in a chair for 5 minutes quietly beforehand, then check it. When you get a cuff, please get those readings and call us/send in MyChart  message with them for our review. Please call sooner if you notice it tending to run over 130 on the top number or 90 on the bottom number.

## 2020-01-21 ENCOUNTER — Telehealth: Payer: Self-pay | Admitting: Physician Assistant

## 2020-01-21 NOTE — Telephone Encounter (Signed)
   Please let pt know I spoke to Dr. Katrinka Blazing about his blood thinners. Dr. Katrinka Blazing says he is OK to stop Brilinta, change to Plavix taking 300mg  on day 1, and 75mg  daily thereafter. Continue aspirin. Thanks. Racquel Arkin PA-C

## 2020-01-21 NOTE — Telephone Encounter (Signed)
Call placed to pt re: phone message below. I have left pt a message to call back.

## 2020-01-25 NOTE — Telephone Encounter (Signed)
2nd attempt to reach pt re: medication change below. Have left another message for pt to return my call.

## 2020-02-02 NOTE — Telephone Encounter (Signed)
Can we see if we can reach out to him this week? Thanks [employer]

## 2020-02-02 NOTE — Telephone Encounter (Signed)
I am fine with the continuation of Brilinta.

## 2020-02-02 NOTE — Telephone Encounter (Signed)
Spoke with pt and he would like to stay on the Brilinta, as he is doing ok for him.  He said since he has insurance, he only pays $24 a month for all of his medications total.   I advised him I would let Ronie Spies, PA-C know Pt was grateful for the call.

## 2020-02-15 MED FILL — CARVEDILOL 6.25 MG TABLET: 6.25 | 30 days supply | Qty: 60 | Fill #2

## 2020-02-15 MED FILL — BRILINTA 90 MG TABLET: 90 | 30 days supply | Qty: 60 | Fill #1

## 2020-02-15 MED FILL — AMLODIPINE BESYLATE 5 MG TA: 5 | 30 days supply | Qty: 30 | Fill #3

## 2020-02-15 MED FILL — LISINOPRIL 20 MG TABLET: 20 | 30 days supply | Qty: 30 | Fill #3

## 2020-03-22 ENCOUNTER — Encounter (HOSPITAL_BASED_OUTPATIENT_CLINIC_OR_DEPARTMENT_OTHER): Payer: Self-pay | Admitting: Emergency Medicine

## 2020-03-22 ENCOUNTER — Emergency Department (HOSPITAL_BASED_OUTPATIENT_CLINIC_OR_DEPARTMENT_OTHER)
Admission: EM | Admit: 2020-03-22 | Discharge: 2020-03-22 | Disposition: A | Discharge status: Home / Self Care | Payer: 59 | Attending: Emergency Medicine | Admitting: Emergency Medicine

## 2020-03-22 ENCOUNTER — Other Ambulatory Visit: Payer: Self-pay

## 2020-03-22 DIAGNOSIS — I10 Essential (primary) hypertension: Secondary | ICD-10-CM | POA: Diagnosis not present

## 2020-03-22 DIAGNOSIS — Z87891 Personal history of nicotine dependence: Secondary | ICD-10-CM | POA: Insufficient documentation

## 2020-03-22 DIAGNOSIS — Z79899 Other long term (current) drug therapy: Secondary | ICD-10-CM | POA: Diagnosis not present

## 2020-03-22 DIAGNOSIS — I2510 Atherosclerotic heart disease of native coronary artery without angina pectoris: Secondary | ICD-10-CM | POA: Insufficient documentation

## 2020-03-22 DIAGNOSIS — I252 Old myocardial infarction: Secondary | ICD-10-CM | POA: Insufficient documentation

## 2020-03-22 DIAGNOSIS — Z955 Presence of coronary angioplasty implant and graft: Secondary | ICD-10-CM | POA: Diagnosis not present

## 2020-03-22 DIAGNOSIS — M79662 Pain in left lower leg: Secondary | ICD-10-CM | POA: Insufficient documentation

## 2020-03-22 DIAGNOSIS — J45909 Unspecified asthma, uncomplicated: Secondary | ICD-10-CM | POA: Diagnosis not present

## 2020-03-22 DIAGNOSIS — Z7982 Long term (current) use of aspirin: Secondary | ICD-10-CM | POA: Insufficient documentation

## 2020-03-22 MED ORDER — KETOROLAC TROMETHAMINE 15 MG/ML IJ SOLN
15.0000 mg | Freq: Once | INTRAMUSCULAR | Status: AC
  Administered 2020-03-22: 15 mg via INTRAMUSCULAR
  Filled 2020-03-22: qty 1

## 2020-03-22 NOTE — Discharge Instructions (Addendum)
You may keep the Ace wrap in place until you follow-up with orthopedics.  You may take some Aleve over-the-counter to help with your pain.  You may also apply ice to the area.  Dr. Jordan Likes number is attached to your discharge papers, please schedule an appointment for follow-up of your left calf pain.

## 2020-03-22 NOTE — ED Triage Notes (Signed)
Pt here with left calf pain that came on suddenly today. Was wrestling with a friend, but this happening afterward. Sharp 10/10 pain with swelling evident.

## 2020-03-22 NOTE — ED Provider Notes (Signed)
[facility] EMERGENCY DEPARTMENT Provider Note   CSN: [id number] Arrival date & time: 03/22/20  1709     History Chief Complaint  Patient presents with  . Leg Pain    Mike Frazier is a 39 y.o. male.  39 y.o male with a PMH of CAD, Pre diabetes presents to the ED with a chief complaint of left calf pain x 1 hour pta. Patient reports he had reunited with a friend reports while jokingly were wrestling, when he felt a sharp pain to the left calf. He also state he had just finished putting air on his tires, and is unsure wether this machine hit his left calf. He reports pain to the area worse with ambulation. Has not taken any medication for improvement in symptoms. No other injuries, no prior history of blood clots, no chest pain or shortness of breath.   The history is provided by the patient.       Past Medical History:  Diagnosis Date  . Asthma   . Coronary artery disease    a. NSTEMI  s/p DES stent to LAD in 09/2016. b. NSTEMI s/p DES to LAD in 11/2018, EF 55-65%.  . H/O noncompliance with medical treatment, presenting hazards to health, due to $   . High cholesterol   . Hypertension   . Migraine    "grew out of them; sports related" (12/17/2018)  . NSTEMI (non-ST elevated myocardial infarction) (HCC) 08/2016; 12/16/2018  . Pre-diabetes   . Torsion, testicular     Patient Active Problem List   Diagnosis Date Noted  . CAD (coronary artery disease) 06/14/2019  . H/O noncompliance with medical treatment, presenting hazards to health, due to $   . CAD in native artery 12/18/2018  . Obesity 12/18/2018  . Chest pain 12/16/2018  . Hyperlipidemia 12/12/2016  . Unstable angina (HCC) 10/01/2016  . Status post coronary artery stent placement   . NSTEMI (non-ST elevated myocardial infarction) (HCC) 09/30/2016  . Precordial pain   . Essential hypertension 09/28/2016  . Hypokalemia 09/28/2016  . Felon 10/21/2012    Past Surgical History:  Procedure Laterality  Date  . CARDIAC CATHETERIZATION N/A 09/30/2016   Procedure: Left Heart Cath and Coronary Angiography;  Surgeon: Lyn Records, MD;  Location: [facility] INVASIVE CV LAB;  Service: Cardiovascular;  Laterality: N/A;  . CARDIAC CATHETERIZATION N/A 09/30/2016   Procedure: Intravascular Pressure Wire/FFR Study;  Surgeon: Lyn Records, MD;  Location: [facility] INVASIVE CV LAB;  Service: Cardiovascular;  Laterality: N/A;  . CARDIAC CATHETERIZATION N/A 09/30/2016   Procedure: Coronary Stent Intervention;  Surgeon: Lyn Records, MD;  Location: [facility] INVASIVE CV LAB;  Service: Cardiovascular;  Laterality: N/A;  . CORONARY ANGIOPLASTY WITH STENT PLACEMENT  12/17/2018  . I & D EXTREMITY  10/21/2012   Procedure: IRRIGATION AND DEBRIDEMENT EXTREMITY;  Surgeon: Marlowe Shores, MD;  Location: [facility] OR;  Service: Orthopedics;  Laterality: Left;  . INCISION / DRAINAGE HAND / FINGER Right    "bit by spider twice; once on left thumb; once on right middle finger" (12/17/2018  . LEFT HEART CATH AND CORONARY ANGIOGRAPHY N/A 12/17/2018   Procedure: LEFT HEART CATH AND CORONARY ANGIOGRAPHY;  Surgeon: Runell Gess, MD;  Location: [facility] INVASIVE CV LAB;  Service: Cardiovascular;  Laterality: N/A;  . TESTICLE TORSION REDUCTION  ~ 1997       Family History  Problem Relation Age of Onset  . Hypertension Mother   . Heart attack Sister  2 MI before age 90  . Hypertension Maternal Grandmother     Social History   Tobacco Use  . Smoking status: Former Smoker    Packs/day: 0.40    Years: 23.00    Pack years: 9.20    Types: Cigarettes    Quit date: 12/17/2018    Years since quitting: 1.2  . Smokeless tobacco: Never Used  Substance Use Topics  . Alcohol use: Yes    Comment: 12/17/2018 "maybe 6 beer/smonth; if that"  . Drug use: Yes    Types: Marijuana    Comment: 12/17/2018 "maybe 2 times/wk"    Home Medications Prior to Admission medications   Medication Sig Start Date End Date Taking? Authorizing Provider    amLODipine (NORVASC) 5 MG tablet Take 1 tablet (5 mg total) by mouth daily. 06/14/19   Daune Perch, NP  aspirin 81 MG EC tablet Take 1 tablet (81 mg total) by mouth daily. 10/07/16   Brayton Caves, PA-C  atorvastatin (LIPITOR) 80 MG tablet TAKE 1 TABLET (80 MG TOTAL) BY MOUTH EVERY EVENING. 03/15/19   Barrett, Evelene Croon, PA-C  carvedilol (COREG) 6.25 MG tablet Take 1 tablet (6.25 mg total) by mouth 2 (two) times daily with a meal. 06/14/19   Daune Perch, NP  lisinopril (ZESTRIL) 20 MG tablet Take 1 tablet (20 mg total) by mouth daily. 06/14/19   Daune Perch, NP  nitroGLYCERIN (NITROSTAT) 0.4 MG SL tablet Place 1 tablet (0.4 mg total) under the tongue every 5 (five) minutes as needed for chest pain (up to 3 doses). 12/18/18   Sherren Mocha, MD  ticagrelor (BRILINTA) 90 MG TABS tablet Take 1 tablet (90 mg total) by mouth 2 (two) times daily. 06/14/19   Daune Perch, NP    Allergies    Codeine  Review of Systems   Review of Systems  Constitutional: Negative for fever.  HENT: Negative for sinus pressure and sore throat.   Respiratory: Negative for shortness of breath.   Cardiovascular: Negative for chest pain.  Gastrointestinal: Negative for blood in stool.  Genitourinary: Negative for flank pain.  Musculoskeletal: Positive for myalgias. Negative for back pain.  Neurological: Negative for light-headedness and headaches.  All other systems reviewed and are negative.   Physical Exam Updated Vital Signs BP (!) 149/90 (BP Location: Right Arm)   Pulse 80   Temp 98.3 F (36.8 C) (Oral)   Resp 18   SpO2 100%   Physical Exam Vitals and nursing note reviewed.  Constitutional:      Appearance: Normal appearance.  HENT:     Head: Normocephalic and atraumatic.     Nose: Nose normal.     Mouth/Throat:     Mouth: Mucous membranes are moist.  Eyes:     Pupils: Pupils are equal, round, and reactive to light.  Cardiovascular:     Rate and Rhythm: Normal rate.  Pulmonary:      Effort: Pulmonary effort is normal.     Breath sounds: No wheezing or rales.  Abdominal:     General: Abdomen is flat.     Tenderness: There is no abdominal tenderness.  Musculoskeletal:        General: Tenderness present.     Cervical back: Normal range of motion and neck supple.     Right lower leg: No swelling. No edema.     Left lower leg: Tenderness present. No swelling. No edema.       Legs:     Comments: TTP along the left calf,  no erythema, no changes in skin. Mild swelling noted to area. Pulses present. No pitting edema.   Skin:    General: Skin is warm and dry.  Neurological:     Mental Status: He is alert and oriented to person, place, and time.     ED Results / Procedures / Treatments   Labs (all labs ordered are listed, but only abnormal results are displayed) Labs Reviewed - No data to display  EKG None  Radiology No results found.  Procedures Procedures (including critical care time)  Medications Ordered in ED Medications  ketorolac (TORADOL) 15 MG/ML injection 15 mg (15 mg Intramuscular Given 03/22/20 1754)    ED Course  I have reviewed the triage vital signs and the nursing notes.  Pertinent labs & imaging results that were available during my care of the patient were reviewed by me and considered in my medical decision making (see chart for details).    MDM Rules/Calculators/A&P   Patient with a past medical history of CAD presents to the ED with complaints of left calf pain which began after wrestling his "buddy "prior to arrival.  He reports not feeling a sensation of a pop when this occurred but sudden onset of sharp pain to the left calf, this immediately began to swell.  During my evaluation patient is neurovascularly intact, pulses are present, capillary refills intact, does have some pain with plantar flexion but is able to fully range his right and left ankle.  He was ambulatory in the ED with an antalgic gait.  Reports ambulation does  exacerbate his symptoms.  Prior history of blood clots, lower suspicion for DVT as there was a sudden onset of swelling to his leg.  He does not have any chest pain, shortness of breath.  No changes in the skin, erythema.  Some suspicion for a gastrocnemius strain.  No pain along the Achilles tendon distribution, negative Hoffmann's.  Patient was provided with an injection of Toradol while in the ED, RICE therapy was encouraged, follow-up with orthopedic specialist was also recommended.  Patient states and agrees to management, return precautions discussed at length.   Portions of this note were generated with Scientist, clinical (histocompatibility and immunogenetics). Dictation errors may occur despite best attempts at proofreading.  Final Clinical Impression(s) / ED Diagnoses Final diagnoses:  Pain of left calf    Rx / DC Orders ED Discharge Orders    None       Claude Manges, PA-C 03/22/20 1818    Virgina Norfolk, DO 03/22/20 2235

## 2020-06-09 ENCOUNTER — Encounter (HOSPITAL_BASED_OUTPATIENT_CLINIC_OR_DEPARTMENT_OTHER): Payer: Self-pay | Admitting: Emergency Medicine

## 2020-06-09 ENCOUNTER — Emergency Department (HOSPITAL_BASED_OUTPATIENT_CLINIC_OR_DEPARTMENT_OTHER): Payer: 59

## 2020-06-09 ENCOUNTER — Other Ambulatory Visit: Payer: Self-pay

## 2020-06-09 ENCOUNTER — Inpatient Hospital Stay (HOSPITAL_BASED_OUTPATIENT_CLINIC_OR_DEPARTMENT_OTHER)
Admission: AD | Admit: 2020-06-09 | Discharge: 2020-06-21 | DRG: 234 | Disposition: A | Discharge status: Home / Self Care | Payer: 59 | Attending: Cardiothoracic Surgery | Admitting: Cardiothoracic Surgery

## 2020-06-09 DIAGNOSIS — I214 Non-ST elevation (NSTEMI) myocardial infarction: Principal | ICD-10-CM

## 2020-06-09 DIAGNOSIS — I252 Old myocardial infarction: Secondary | ICD-10-CM | POA: Diagnosis not present

## 2020-06-09 DIAGNOSIS — Z885 Allergy status to narcotic agent status: Secondary | ICD-10-CM | POA: Diagnosis not present

## 2020-06-09 DIAGNOSIS — E669 Obesity, unspecified: Secondary | ICD-10-CM | POA: Diagnosis present

## 2020-06-09 DIAGNOSIS — E7800 Pure hypercholesterolemia, unspecified: Secondary | ICD-10-CM | POA: Diagnosis present

## 2020-06-09 DIAGNOSIS — E785 Hyperlipidemia, unspecified: Secondary | ICD-10-CM | POA: Diagnosis present

## 2020-06-09 DIAGNOSIS — Z79899 Other long term (current) drug therapy: Secondary | ICD-10-CM

## 2020-06-09 DIAGNOSIS — J45909 Unspecified asthma, uncomplicated: Secondary | ICD-10-CM | POA: Diagnosis present

## 2020-06-09 DIAGNOSIS — E119 Type 2 diabetes mellitus without complications: Secondary | ICD-10-CM | POA: Diagnosis present

## 2020-06-09 DIAGNOSIS — Z9114 Patient's other noncompliance with medication regimen: Secondary | ICD-10-CM

## 2020-06-09 DIAGNOSIS — Z8249 Family history of ischemic heart disease and other diseases of the circulatory system: Secondary | ICD-10-CM | POA: Diagnosis not present

## 2020-06-09 DIAGNOSIS — Z01818 Encounter for other preprocedural examination: Secondary | ICD-10-CM

## 2020-06-09 DIAGNOSIS — I2510 Atherosclerotic heart disease of native coronary artery without angina pectoris: Secondary | ICD-10-CM | POA: Diagnosis present

## 2020-06-09 DIAGNOSIS — I10 Essential (primary) hypertension: Secondary | ICD-10-CM | POA: Diagnosis present

## 2020-06-09 DIAGNOSIS — Z955 Presence of coronary angioplasty implant and graft: Secondary | ICD-10-CM | POA: Diagnosis not present

## 2020-06-09 DIAGNOSIS — Z7982 Long term (current) use of aspirin: Secondary | ICD-10-CM

## 2020-06-09 DIAGNOSIS — E876 Hypokalemia: Secondary | ICD-10-CM | POA: Diagnosis present

## 2020-06-09 DIAGNOSIS — Z20822 Contact with and (suspected) exposure to covid-19: Secondary | ICD-10-CM | POA: Diagnosis present

## 2020-06-09 DIAGNOSIS — Z9119 Patient's noncompliance with other medical treatment and regimen: Secondary | ICD-10-CM | POA: Diagnosis not present

## 2020-06-09 DIAGNOSIS — Z951 Presence of aortocoronary bypass graft: Secondary | ICD-10-CM

## 2020-06-09 DIAGNOSIS — T82855A Stenosis of coronary artery stent, initial encounter: Secondary | ICD-10-CM | POA: Diagnosis present

## 2020-06-09 DIAGNOSIS — I119 Hypertensive heart disease without heart failure: Secondary | ICD-10-CM | POA: Diagnosis present

## 2020-06-09 DIAGNOSIS — I25110 Atherosclerotic heart disease of native coronary artery with unstable angina pectoris: Secondary | ICD-10-CM | POA: Diagnosis not present

## 2020-06-09 DIAGNOSIS — Z87891 Personal history of nicotine dependence: Secondary | ICD-10-CM | POA: Diagnosis not present

## 2020-06-09 DIAGNOSIS — Z01810 Encounter for preprocedural cardiovascular examination: Secondary | ICD-10-CM | POA: Diagnosis not present

## 2020-06-09 DIAGNOSIS — R079 Chest pain, unspecified: Secondary | ICD-10-CM | POA: Diagnosis present

## 2020-06-09 DIAGNOSIS — Z09 Encounter for follow-up examination after completed treatment for conditions other than malignant neoplasm: Secondary | ICD-10-CM

## 2020-06-09 HISTORY — DX: Patient's noncompliance with other medical treatment and regimen: Z91.19

## 2020-06-09 HISTORY — DX: Patient's noncompliance with other medical treatment and regimen due to unspecified reason: Z91.199

## 2020-06-09 LAB — BASIC METABOLIC PANEL
Anion gap: 10 (ref 5–15)
BUN: 8 mg/dL (ref 6–20)
CO2: 27 mmol/L (ref 22–32)
Calcium: 8.5 mg/dL — ABNORMAL LOW (ref 8.9–10.3)
Chloride: 102 mmol/L (ref 98–111)
Creatinine, Ser: 1.02 mg/dL (ref 0.61–1.24)
GFR calc Af Amer: >60 mL/min (ref >60)
GFR calc non Af Amer: >60 mL/min (ref >60)
Glucose, Bld: 119 mg/dL — ABNORMAL HIGH (ref 70–99)
Potassium: 3.2 mmol/L — ABNORMAL LOW (ref 3.5–5.1)
Sodium: 139 mmol/L (ref 135–145)

## 2020-06-09 LAB — CBC
HCT: 46.2 % (ref 39.0–52.0)
Hemoglobin: 15.2 g/dL (ref 13.0–17.0)
MCH: 29.1 pg (ref 26.0–34.0)
MCHC: 32.9 g/dL (ref 30.0–36.0)
MCV: 88.5 fL (ref 80.0–100.0)
Platelets: 293 10*3/uL (ref 150–400)
RBC: 5.22 MIL/uL (ref 4.22–5.81)
RDW: 13.5 % (ref 11.5–15.5)
WBC: 8.4 10*3/uL (ref 4.0–10.5)
nRBC: 0 % (ref 0.0–0.2)

## 2020-06-09 LAB — RAPID URINE DRUG SCREEN, HOSP PERFORMED
Amphetamines: NOT DETECTED
Barbiturates: NOT DETECTED
Benzodiazepines: NOT DETECTED
Cocaine: NOT DETECTED
Opiates: NOT DETECTED
Tetrahydrocannabinol: POSITIVE — AB

## 2020-06-09 LAB — TROPONIN I (HIGH SENSITIVITY)
Troponin I (High Sensitivity): 8 ng/L (ref <18)
Troponin I (High Sensitivity): 31 ng/L — ABNORMAL HIGH (ref <18)
Troponin I (High Sensitivity): 49 ng/L — ABNORMAL HIGH (ref <18)
Troponin I (High Sensitivity): 48 ng/L — ABNORMAL HIGH (ref <18)

## 2020-06-09 LAB — HIV ANTIBODY (ROUTINE TESTING W REFLEX): HIV Screen 4th Generation wRfx: NONREACTIVE

## 2020-06-09 LAB — SARS CORONAVIRUS 2 BY RT PCR (HOSPITAL ORDER, PERFORMED IN ~~LOC~~ HOSPITAL LAB): SARS Coronavirus 2: NEGATIVE

## 2020-06-09 LAB — HEMOGLOBIN A1C
Hgb A1c MFr Bld: 6.3 % — ABNORMAL HIGH (ref 4.8–5.6)
Mean Plasma Glucose: 134.11 mg/dL

## 2020-06-09 LAB — MRSA PCR SCREENING: MRSA by PCR: POSITIVE — AB

## 2020-06-09 LAB — HEPARIN LEVEL (UNFRACTIONATED): Heparin Unfractionated: 0.21 IU/mL — ABNORMAL LOW (ref 0.30–0.70)

## 2020-06-09 MED ORDER — POTASSIUM CHLORIDE CRYS ER 20 MEQ PO TBCR
40.0000 meq | EXTENDED_RELEASE_TABLET | Freq: Once | ORAL | Status: AC
  Administered 2020-06-09: 40 meq via ORAL
  Filled 2020-06-09: qty 2

## 2020-06-09 MED ORDER — TICAGRELOR 90 MG PO TABS
90.0000 mg | ORAL_TABLET | Freq: Two times a day (BID) | ORAL | Status: DC
  Administered 2020-06-09 – 2020-06-12 (×7): 90 mg via ORAL
  Filled 2020-06-09 (×8): qty 1

## 2020-06-09 MED ORDER — SODIUM CHLORIDE 0.9% FLUSH
3.0000 mL | INTRAVENOUS | Status: DC | PRN
  Administered 2020-06-09: 3 mL via INTRAVENOUS

## 2020-06-09 MED ORDER — ASPIRIN EC 81 MG PO TBEC
81.0000 mg | DELAYED_RELEASE_TABLET | Freq: Every day | ORAL | Status: DC
  Administered 2020-06-10 – 2020-06-15 (×6): 81 mg via ORAL
  Filled 2020-06-09 (×6): qty 1

## 2020-06-09 MED ORDER — SODIUM CHLORIDE 0.9% FLUSH
3.0000 mL | Freq: Two times a day (BID) | INTRAVENOUS | Status: DC
  Administered 2020-06-09 – 2020-06-11 (×4): 3 mL via INTRAVENOUS

## 2020-06-09 MED ORDER — CARVEDILOL 6.25 MG PO TABS
6.2500 mg | ORAL_TABLET | Freq: Two times a day (BID) | ORAL | Status: DC
  Administered 2020-06-09 – 2020-06-15 (×14): 6.25 mg via ORAL
  Filled 2020-06-09 (×14): qty 1

## 2020-06-09 MED ORDER — HEPARIN BOLUS VIA INFUSION
4000.0000 [IU] | Freq: Once | INTRAVENOUS | Status: AC
  Administered 2020-06-09: 4000 [IU] via INTRAVENOUS

## 2020-06-09 MED ORDER — AMLODIPINE BESYLATE 5 MG PO TABS
5.0000 mg | ORAL_TABLET | Freq: Every day | ORAL | Status: DC
  Administered 2020-06-09 – 2020-06-15 (×7): 5 mg via ORAL
  Filled 2020-06-09 (×7): qty 1

## 2020-06-09 MED ORDER — NITROGLYCERIN 0.4 MG SL SUBL
0.4000 mg | SUBLINGUAL_TABLET | SUBLINGUAL | Status: DC | PRN
  Administered 2020-06-09: 0.4 mg via SUBLINGUAL
  Filled 2020-06-09: qty 1

## 2020-06-09 MED ORDER — ASPIRIN 81 MG PO CHEW
243.0000 mg | CHEWABLE_TABLET | Freq: Once | ORAL | Status: AC
  Administered 2020-06-09: 243 mg via ORAL
  Filled 2020-06-09: qty 3

## 2020-06-09 MED ORDER — CHLORHEXIDINE GLUCONATE CLOTH 2 % EX PADS
6.0000 | MEDICATED_PAD | Freq: Every day | CUTANEOUS | Status: AC
  Administered 2020-06-10 – 2020-06-13 (×4): 6 via TOPICAL

## 2020-06-09 MED ORDER — HEPARIN (PORCINE) 25000 UT/250ML-% IV SOLN
1200.0000 [IU]/h | INTRAVENOUS | Status: DC
  Administered 2020-06-09: 950 [IU]/h via INTRAVENOUS
  Administered 2020-06-10 – 2020-06-11 (×2): 1200 [IU]/h via INTRAVENOUS
  Filled 2020-06-09 (×4): qty 250

## 2020-06-09 MED ORDER — ONDANSETRON HCL 4 MG/2ML IJ SOLN: 4.0000 mg | Freq: Four times a day (QID) | INTRAMUSCULAR | Status: DC | PRN

## 2020-06-09 MED ORDER — ACETAMINOPHEN 325 MG PO TABS
650.0000 mg | ORAL_TABLET | ORAL | Status: DC | PRN
  Administered 2020-06-13: 650 mg via ORAL
  Filled 2020-06-09: qty 2

## 2020-06-09 MED ORDER — HEPARIN BOLUS VIA INFUSION
1000.0000 [IU] | Freq: Once | INTRAVENOUS | Status: AC
  Administered 2020-06-09: 1000 [IU] via INTRAVENOUS
  Filled 2020-06-09: qty 1000

## 2020-06-09 MED ORDER — SODIUM CHLORIDE 0.9 % IV SOLN: 250.0000 mL | INTRAVENOUS | Status: DC | PRN

## 2020-06-09 MED ORDER — LISINOPRIL 20 MG PO TABS
20.0000 mg | ORAL_TABLET | Freq: Every day | ORAL | Status: DC
  Administered 2020-06-09 – 2020-06-15 (×7): 20 mg via ORAL
  Filled 2020-06-09: qty 2
  Filled 2020-06-09 (×6): qty 1

## 2020-06-09 MED ORDER — ATORVASTATIN CALCIUM 80 MG PO TABS
80.0000 mg | ORAL_TABLET | Freq: Every evening | ORAL | Status: DC
  Administered 2020-06-09 – 2020-06-20 (×11): 80 mg via ORAL
  Filled 2020-06-09 (×11): qty 1

## 2020-06-09 MED ORDER — MUPIROCIN 2 % EX OINT
1.0000 "application " | TOPICAL_OINTMENT | Freq: Two times a day (BID) | CUTANEOUS | Status: AC
  Administered 2020-06-09 – 2020-06-14 (×10): 1 via NASAL
  Filled 2020-06-09 (×3): qty 22

## 2020-06-09 MED ORDER — SODIUM CHLORIDE 0.9% FLUSH
3.0000 mL | Freq: Two times a day (BID) | INTRAVENOUS | Status: DC
  Administered 2020-06-12 – 2020-06-15 (×2): 3 mL via INTRAVENOUS

## 2020-06-09 NOTE — ED Notes (Signed)
Report provided for [facility], ETA 1345 for transport. Pt remains chest pain free at this time.

## 2020-06-09 NOTE — Progress Notes (Addendum)
ANTICOAGULATION CONSULT NOTE - Follow-Up Consult Note  Pharmacy Consult for heparin Indication: chest pain/ACS  Patient Measurements: Weight: 104.1 kg (229 lb 8 oz)  Ideal body weight: 68.4 kg (150 lb 12.7 oz) Adjusted ideal body weight: 82.7 kg (182 lb 4.4 oz)  Heparin Dosing Weight: 90 kg  Vital Signs: Temp: 98.4 F (36.9 C) (06/11 1838) Temp Source: Oral (06/11 1838) BP: 155/93 (06/11 1838) Pulse Rate: 64 (06/11 1838)  Labs: Recent Labs    06/09/20 0614 06/09/20 0614 06/09/20 0808 06/09/20 1147 06/09/20 1830  HGB 15.2  --   --   --   --   HCT 46.2  --   --   --   --   PLT 293  --   --   --   --   HEPARINUNFRC  --   --   --   --  0.21*  CREATININE 1.02  --   --   --   --   TROPONINIHS 8   < > 31* 49* 48*   < > = values in this interval not displayed.    Estimated Creatinine Clearance: 111.5 mL/min (by C-G formula based on SCr of 1.02 mg/dL).   Assessment: 22 YOM presenting with CP, hx of CAD s/p PCIx2.  Not on anticoagulation PTA, on DAPT.  CBC wnl. Pharmacy consulted to dose Heparin for anticoagulation.  Heparin level this evening is SUBtherapeutic (HL 0.21, goal of 0.3-0.7). CBC wnl. No bleeding or issues noted per RN.   Noted pt was previously therapeutic at a rate of 1250 units/hr back in Dec 2019.  Goal of Therapy:  Heparin level 0.3-0.7 units/ml Monitor platelets by anticoagulation protocol: Yes   Plan:  - Heparin bolus of 1000 units x 1 - Increase Heparin to 1200 units/hr (12 ml/hr) - Noted plans for cath Monday, 6/14 - Will continue to monitor for any signs/symptoms of bleeding and will follow up with heparin level in 6 hours   Thank you for allowing pharmacy to be a part of this patient's care.  Georgina Pillion, PharmD, BCPS Clinical Pharmacist Clinical phone for 06/09/2020: [phone] 06/09/2020 7:38 PM   **Pharmacist phone directory can now be found on [email or web address] ([email or web address]).  Listed under [facility].

## 2020-06-09 NOTE — Plan of Care (Signed)
  Problem: Education: Goal: Knowledge of General Education information will improve Description: Including pain rating scale, medication(s)/side effects and non-pharmacologic comfort measures Outcome: Progressing   Problem: Clinical Measurements: Goal: Respiratory complications will improve Outcome: Progressing   Problem: Activity: Goal: Risk for activity intolerance will decrease Outcome: Progressing   

## 2020-06-09 NOTE — Consult Note (Deleted)
Disregard note. See H/P.  After evaluating patient, we feel this patient is appropriate for admission to cardiology team.  Notified Dr. Jacqulyn Bath who was listed as attending. We will take over. Benjerman Molinelli PA-C

## 2020-06-09 NOTE — ED Triage Notes (Signed)
Left shoulder pain/chest pain since walking up x45 minutes ago. Took 81mg  aspirin and came to ED. Denies other symptoms. Hx stents, marijuana use.

## 2020-06-09 NOTE — Progress Notes (Signed)
ANTICOAGULATION CONSULT NOTE - Initial Consult  Pharmacy Consult for heparin Indication: chest pain/ACS  Allergies  Allergen Reactions  . Codeine Shortness Of Breath    Patient Measurements: Weight: 104.1 kg (229 lb 8 oz) Heparin Dosing Weight: 80kg  Vital Signs: Temp: 98 F (36.7 C) (06/11 0952) Temp Source: Oral (06/11 0952) BP: 123/87 (06/11 0952) Pulse Rate: 63 (06/11 0952)  Labs: Recent Labs    06/09/20 0614 06/09/20 0808  HGB 15.2  --   HCT 46.2  --   PLT 293  --   CREATININE 1.02  --   TROPONINIHS 8 31*    Estimated Creatinine Clearance: 111.5 mL/min (by C-G formula based on SCr of 1.02 mg/dL).   Medical History: Past Medical History:  Diagnosis Date  . Asthma   . Coronary artery disease    a. NSTEMI  s/p DES stent to LAD in 09/2016. b. NSTEMI s/p DES to LAD in 11/2018, EF 55-65%.  . H/O noncompliance with medical treatment, presenting hazards to health, due to $   . High cholesterol   . Hypertension   . Migraine    "grew out of them; sports related" (12/17/2018)  . NSTEMI (non-ST elevated myocardial infarction) (HCC) 08/2016; 12/16/2018  . Pre-diabetes   . Torsion, testicular    Assessment: 51 YOM presenting with CP, hx of CAD s/p PCIx2.  Not on anticoagulation PTA, on DAPT.  CBC wnl.  Goal of Therapy:  Heparin level 0.3-0.7 units/ml Monitor platelets by anticoagulation protocol: Yes   Plan:  Heparin 4000 units IV x 1, and gtt at 950 units/hr F/u 6 hour heparin level  Daylene Posey, PharmD Clinical Pharmacist ED Pharmacist Phone # [phone] 06/09/2020 10:21 AM

## 2020-06-09 NOTE — H&P (Addendum)
Cardiology Admission History and Physical:   Patient ID: Mike Frazier MRN: [id number]; DOB: [birth year]   Admission date: 06/09/2020  Primary Care Provider: Scot Jun, FNP [facility] Cardiologist: Sinclair Grooms, MD  [place] Electrophysiologist:  None   Chief Complaint:  Chest pain  Patient Profile:   Mike Frazier is a 40 y.o. male with a hx of CAD (NSTEMI 2017 w/ DES to LAD, NSTEMI 11/2018 s/p DES to LAD), HTN, HLD, pre-diabetes, former tobaccouse, asthma, obesity, THC use, episodic noncompliance who is being seen today for the evaluation of chest pain/elevated troponin at the request of Dr. Regenia Skeeter.   Initially referred for hospitalist admission but upon H/P it seemed appropriate to admit patient to cardiology service.  History of Present Illness:   Mike Frazier had previous NSTEMI/cath in 2017 as above. When he followed up after that admission he had run out of his medications. Brilinta was then changed to Plavix. Following this, there was another lapse in follow-up when he had run out of his medicines for several more months. He got them refilled but had not been taking them very long before he was readmitted 11/2018 with another NSTEMI with minimal troponin elevation. He underwent cath showing 90% ISR of the proximal LAD treated with DES, LVEDP normal, EF 55-65%. He was placed back on Brilinta. I met him in January 2021 when he presented to the clinic for evaluation of elevated blood pressure in the context of dysgeusia, headache, fatigue and nasal congestion. His blood pressure was actually 130/82 at that visit, but he was advised to get Covid tested same day which was positive. I had reached out to Dr. Tamala Julian to inquire about long term plan for Brilinta. Dr. Tamala Julian felt he was OK to change to Plavix, but the patient felt he preferred to stay on Brilinta.  He is seen in transfer today from [place] for chest pain. He states he prefers to be honest with his  doctors and reports that he does not always take his medications consistently. He tends to skip doses when he is feeling well. He is not able to quantify which ones specifically and how often. It sounds like taking the night dose of Brilinta, carvedilol and atorvastatin has been problematic. His family has been getting on him about this. 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 He's felt fine without any exertional anginal symptoms. This morning he woke up around 3 AM with chest discomfort/throbbing with some radiation into his arm. This felt different than prior angina in that it was much less severe. No SOB, n/v, palpitations, fever, chills, cough or any other unusual symptoms. He woke up, drank water, felt better and tried to lay back down. However, symptoms recurred/persisted in a constant fashion so after several hours he presented to [facility] for evaluation. He thought it might have been trapped air because he passed gas on the way to the ED. He did not have immediate relief but spontaneously while awaiting workup his symptoms resolved without acute intervention. He was hypertensive on arrival to 163/106. Labs reveal hypokalemia 3.2 (given 70mq), hsTroponin of 8->31->49, CBC wnl, Cr 1.02, Covid negative. He had already taken 811mASA so was given 23470mEKG showed NSR with IRBBB and borderline elevation of ST segments in I, avL, V2 - seen in prior EKGs, but slightly more pronounced.  He remains pain free.  Past Medical History:  Diagnosis Date  . Asthma   . Coronary artery  disease    a. NSTEMI  s/p DES stent to LAD in 09/2016. b. NSTEMI s/p DES to LAD in 11/2018, EF 55-65%.  . High cholesterol   . History of noncompliance with medical treatment, presenting hazards to health   . Hypertension   . Migraine    "grew out of them; sports related" (12/17/2018)  . NSTEMI (non-ST elevated myocardial infarction) ([place]) 08/2016; 12/16/2018  . Pre-diabetes   . Torsion,  testicular     Past Surgical History:  Procedure Laterality Date  . CARDIAC CATHETERIZATION N/A 09/30/2016   Procedure: Left Heart Cath and Coronary Angiography;  Surgeon: Belva Crome, MD;  Location: [place] [address] CV [facility];  Service: Cardiovascular;  Laterality: N/A;  . CARDIAC CATHETERIZATION N/A 09/30/2016   Procedure: Intravascular Pressure Wire/FFR Study;  Surgeon: Belva Crome, MD;  Location: [place] [facility];  Service: Cardiovascular;  Laterality: N/A;  . CARDIAC CATHETERIZATION N/A 09/30/2016   Procedure: Coronary Stent Intervention;  Surgeon: Belva Crome, MD;  Location: [place] [facility];  Service: Cardiovascular;  Laterality: N/A;  . CORONARY ANGIOPLASTY WITH STENT PLACEMENT  12/17/2018  . I & D EXTREMITY  10/21/2012   Procedure: IRRIGATION AND DEBRIDEMENT EXTREMITY;  Surgeon: Schuyler Amor, MD;  Location: [place];  Service: Orthopedics;  Laterality: Left;  . INCISION / DRAINAGE HAND / FINGER Right    "bit by spider twice; once on left thumb; once on right middle finger" (12/17/2018  . LEFT HEART CATH AND CORONARY ANGIOGRAPHY N/A 12/17/2018   Procedure: LEFT HEART CATH AND CORONARY ANGIOGRAPHY;  Surgeon: Lorretta Harp, MD;  Location: [place] [facility];  Service: Cardiovascular;  Laterality: N/A;  . TESTICLE TORSION REDUCTION  ~ 1997     Medications Prior to Admission: Prior to Admission medications   Medication Sig Start Date End Date Taking? Authorizing Provider  amLODipine (NORVASC) 5 MG tablet Take 1 tablet (5 mg total) by mouth daily. 06/14/19  Yes Daune Perch, NP  aspirin 81 MG EC tablet Take 1 tablet (81 mg total) by mouth daily. 10/07/16  Yes Ena Dawley, Tiffany S, PA-C  atorvastatin (LIPITOR) 80 MG tablet TAKE 1 TABLET (80 MG TOTAL) BY MOUTH EVERY EVENING. 03/15/19   Barrett, Evelene Croon, PA-C  carvedilol (COREG) 6.25 MG tablet Take 1 tablet (6.25 mg total) by mouth 2 (two) times daily with a meal. 06/14/19   Daune Perch, NP  lisinopril (ZESTRIL) 20 MG tablet Take 1  tablet (20 mg total) by mouth daily. 06/14/19   Daune Perch, NP  nitroGLYCERIN (NITROSTAT) 0.4 MG SL tablet Place 1 tablet (0.4 mg total) under the tongue every 5 (five) minutes as needed for chest pain (up to 3 doses). 12/18/18   Sherren Mocha, MD  ticagrelor (BRILINTA) 90 MG TABS tablet Take 1 tablet (90 mg total) by mouth 2 (two) times daily. 06/14/19   Daune Perch, NP     Allergies:    Allergies  Allergen Reactions  . Codeine Shortness Of Breath    Social History:   Social History   Socioeconomic History  . Marital status: Single    Spouse name: Not on file  . Number of children: Not on file  . Years of education: Not on file  . Highest education level: Not on file  Occupational History  . Occupation: works at [employer]  . Smoking status: Former Smoker    Packs/day: 0.40    Years: 23.00    Pack years: 9.20    Types:  Cigarettes    Quit date: 12/17/2018    Years since quitting: 1.4  . Smokeless tobacco: Never Used  Vaping Use  . Vaping Use: Never used  Substance and Sexual Activity  . Alcohol use: Yes    Comment: 12/17/2018 "maybe 6 beer/smonth; if that"  . Drug use: Yes    Types: Marijuana    Comment: 12/17/2018 "maybe 2 times/wk"  . Sexual activity: Yes  Other Topics Concern  . Not on file  Social History Narrative  . Not on file   Social Determinants of Health   Financial Resource Strain:   . Difficulty of Paying Living Expenses:   Food Insecurity:   . Worried About Charity fundraiser in the Last Year:   . Arboriculturist in the Last Year:   Transportation Needs:   . Film/video editor (Medical):   Marland Kitchen Lack of Transportation (Non-Medical):   Physical Activity:   . Days of Exercise per Week:   . Minutes of Exercise per Session:   Stress:   . Feeling of Stress :   Social Connections:   . Frequency of Communication with Friends and Family:   . Frequency of Social Gatherings with Friends and Family:   . Attends  Religious Services:   . Active Member of Clubs or Organizations:   . Attends Archivist Meetings:   Marland Kitchen Marital Status:   Intimate Partner Violence:   . Fear of Current or Ex-Partner:   . Emotionally Abused:   Marland Kitchen Physically Abused:   . Sexually Abused:     Family History:   The patient's family history includes Heart attack in his sister; Hypertension in his maternal grandmother and mother.    ROS:  Please see the history of present illness.  All other ROS reviewed and negative.     Physical Exam/Data:   Vitals:   06/09/20 1115 06/09/20 1130 06/09/20 1349 06/09/20 1510  BP: (!) 145/98 (!) 148/99 (!) 149/97 (!) 169/95  Pulse: (!) 57 (!) 59 (!) 59 60  Resp: _0 Temp:    98.4 F (36.9 C)  TempSrc:    Oral  SpO2: 99% 99% 100% 100%  Weight:       No intake or output data in the 24 hours ending 06/09/20 1553 Last 3 Weights 06/09/2020 01/20/2020 06/14/2019  Weight (lbs) 229 lb 8 oz 223 lb 12.8 oz 232 lb 6.4 oz  Weight (kg) 104.1 kg 101.515 kg 105.416 kg     Body mass index is 34.9 kg/m.  General: Well developed, well nourished AAM in no acute distress. Head: Normocephalic, atraumatic, sclera non-icteric, no xanthomas, nares are without discharge. Neck: Negative for carotid bruits. JVP not elevated. Lungs: Clear bilaterally to auscultation without wheezes, rales, or rhonchi. Breathing is unlabored. Heart: RRR S1 S2 without murmurs, rubs, or gallops.  Abdomen: Soft, non-tender, non-distended with normoactive bowel sounds. No rebound/guarding. Extremities: No clubbing or cyanosis. No edema. Distal pedal pulses are 2+ and equal bilaterally. Neuro: Alert and oriented X 3. Moves all extremities spontaneously. Psych:  Responds to questions appropriately with a normal affect.   EKG:  The EKG was personally reviewed and demonstrates: 1) Initial tracing 5:58am - NSR 58bpm possible LAE, incomplete RBBB, prior inferior infarct, borderline elevation of ST segments in I,  avL, V2  - seen in prior EKGs, but slightly more pronounced on this tracing 2) 6:24: NSR 64pm, probable LAE, IRBBB, prior inferior infarct, ST elevation less pronounced, similar to baseline  Telemetry:  Telemetry was personally reviewed and demonstrates: N/a, just arrived to floor   Relevant CV Studies: Cardiac Cath 11/2018  Prox LAD lesion is 90% stenosed.  A drug-eluting stent was successfully placed.  Post intervention, there is a 0% residual stenosis.  The left ventricular systolic function is normal.  LV end diastolic pressure is normal.  The left ventricular ejection fraction is 55-65% by visual estimate. PHYSICIAN: Mike Frazier, M.D.  PROCEDURE DESCRIPTION:  The patient was brought to the second floor [place] Cardiac cath lab in the postabsorptive state. He was premedicated with IV Versed and fentanyl. His right wrist was prepped and shaved in usual sterile fashion. Xylocaine 1% was used for local anesthesia. A 6 French sheath was inserted into the radial artery using standard Seldinger technique. The patient received 5000 units of heparin intravenously. A 5 [place] TIG catheter and pigtail catheters were used for selective coronary angiography and left ventriculography respectively. Isovue dye was used for the entirety of the case. Retrograde aorta, left ventricular and pullback pressures were recorded. Radial cocktail was administered via the SideArm sheath.  The patient received Brilinta 180 mg p.o. followed by Angiomax bolus and infusion with a therapeutic ACT. Using a 6 [place] XB LAD 3.5 cm guide catheter along with a 0.14 pro-water guidewire a 2 mm x 12 mm balloon the proximal LAD in-stent restenosis was predilated. Following this a 2.75 mm x 24 mm long Synergy drug-eluting stent was then carefully positioned to include the previously placed stent and deployed at 16 atm. The entire resected segment was postdilated with a 3 mm x 15 mm long noncompliant balloon  at 16 atm (3.1 mm) resulting reduction of a 90% "in-stent restenosis to 0% residual. The patient tolerated procedure well. The guidewire and catheter were removed. The sheath was removed and a TR band was placed on the right wrist to achieve patent hemostasis. The patient left the lab in stable condition.    IMPRESSION:Mike Frazier had aggressive in-stent restenosis within the previously placed proximal LAD stent by Dr. Tamala Julian 2 years ago. He was restented with a synergy drug-eluting stent postdilated to 3.1 mm. Angiomax will continue for 4 hours full dose. The patient was noncompliant with his antiplatelet medications prior to admission. He will be discharged home in the morning. Medication compliance will be reinforced. He left the lab in stable condition.  Mike Frazier. MD, [facility] 12/17/2018 11:44 AM  2D echo 09/2016 Study Conclusions  - Left ventricle: The cavity size was normal. Posterior wall thickness was increased in a pattern of mild LVH. Systolic function was vigorous. The estimated ejection fraction was in the range of 65% to 70%. Wall motion was normal; there were no regional wall motion abnormalities. Left ventricular diastolic function parameters were normal. - Aortic valve: Transvalvular velocity was within the normal range. There was no stenosis. There was no regurgitation. - Mitral valve: Transvalvular velocity was within the normal range. There was no evidence for stenosis. There was no regurgitation. - Right ventricle: The cavity size was normal. Wall thickness was normal. Systolic function was normal. - Tricuspid valve: There was no regurgitation      Laboratory Data:  High Sensitivity Troponin:   Recent Labs  Lab 06/09/20 0614 06/09/20 0808 06/09/20 1147  TROPONINIHS 8 31* 49*      Chemistry Recent Labs  Lab 06/09/20 0614  NA 139  K 3.2*  CL 102  CO2 27  GLUCOSE 119*  BUN 8  CREATININE 1.02  CALCIUM 8.5*  GFRNONAA >60  GFRAA >60  ANIONGAP 10    No results for input(s): PROT, ALBUMIN, AST, ALT, ALKPHOS, BILITOT in the last 168 hours. Hematology Recent Labs  Lab 06/09/20 0614  WBC 8.4  RBC 5.22  HGB 15.2  HCT 46.2  MCV 88.5  MCH 29.1  MCHC 32.9  RDW 13.5  PLT 293   BNPNo results for input(s): BNP, PROBNP in the last 168 hours.  DDimer No results for input(s): DDIMER in the last 168 hours.   Radiology/Studies:  DG Chest 2 View  Result Date: 06/09/2020 CLINICAL DATA:  Left shoulder and chest pain for 1 hour EXAM: CHEST - 2 VIEW COMPARISON:  12/16/2018 CTA FINDINGS: Normal heart size and mediastinal contours. No acute infiltrate or edema. No effusion or pneumothorax. No acute osseous findings. IMPRESSION: Negative chest Electronically Signed   By: Monte Fantasia M.D.   On: 06/09/2020 07:15   { TIMI Risk Score for Unstable Angina or Non-ST Elevation MI:   The patient's TIMI risk score is 5, which indicates a 26% risk of all cause mortality, new or recurrent myocardial infarction or need for urgent revascularization in the next 14 days.   Assessment and Plan:   1. Chest pain/elevated troponin with prior history of coronary artery disease and intermittent medication compliance - concerning for unstable angina/NSTEMI. Unfortunately Mike Frazier has had a period where he has not been taking his medications reliably again which likely contributed. It is not clear exactly what he is missed and how often. We had a discussion about the importance of med compliance. His family has been urging him to do so, but he needs to want to do it for himself. Would resume home medications for now. Discussed with Dr. Audie Box. Plan cath Monday. Risks and benefits of cardiac catheterization have been discussed with the patient.  These include bleeding, infection, kidney damage, stroke, heart attack, death.  The patient understands these risks and is willing to proceed. Continue ASA, BB, statin, Brilinta,  heparin. Will f/u another troponin this afternoon and again in AM. Obtain echocardiogram and UDS for completeness. He remains pain free at this time but was urged to notify nursing if he feels any recurrence. Cath has been scheduled for Monday at noon with Dr. Burt Knack. Standard pre-cath orders written under sign/held.  2. Essential HTN - likely poorly controlled in context of noncompliance. Resume home meds and follow response. If titration needed would start with increasing amlodipine.  3. Hyperlipidemia - LDL well controlled by last values 12/2019. Patient has not been strictly compliant with atorvastatin so would recheck as OP when he has been more compliant.  4. Hypokalemia - s/p replacement in ED, will need f/u labs with admit orders.  5. Pre-DM - check A1C with labs.   Severity of Illness: The appropriate patient status for this patient is INPATIENT. Inpatient status is judged to be reasonable and necessary in order to provide the required intensity of service to ensure the patient's safety. The patient's presenting symptoms, physical exam findings, and initial radiographic and laboratory data in the context of their chronic comorbidities is felt to place them at high risk for further clinical deterioration. Furthermore, it is not anticipated that the patient will be medically stable for discharge from the hospital within 2 midnights of admission. The following factors support the patient status of inpatient.   " The patient's presenting symptoms include chest pain. " The worrisome physical exam findings include elevated BP. " The initial radiographic and laboratory data are worrisome  because of elevated troponin. " The chronic co-morbidities include CAD, HTN, HLD.   * I certify that at the point of admission it is my clinical judgment that the patient will require inpatient hospital care spanning beyond 2 midnights from the point of admission due to high intensity of service, high risk  for further deterioration and high frequency of surveillance required.*    For questions or updates, please contact [place] Please consult [email or web address] for contact info under     Signed, Charlie Pitter, PA-C  06/09/2020 3:53 PM

## 2020-06-09 NOTE — ED Provider Notes (Addendum)
Patient is pain free. Discussed with Dr. Katrinka Blazing for admission for high risk Chest pain.   Pricilla Loveless, MD 06/09/20 0830  Second troponin has resulted at 31.  While this is not severely elevated, it is definitely a significant jump from before in relation to his first troponin.  He remains pain-free.  Chart review indicates he has some pretty significant coronary disease at baseline.  I discussed with Dr. Flora Lipps of cardiology.  He recommends heparin and cardiology will evaluate when he arrives at [facility].  I have updated Dr. Katrinka Blazing as well.  CRITICAL CARE Performed by: Audree Camel   Total critical care time: 30 minutes  Critical care time was exclusive of separately billable procedures and treating other patients.  Critical care was necessary to treat or prevent imminent or life-threatening deterioration.  Critical care was time spent personally by me on the following activities: development of treatment plan with patient and/or surrogate as well as nursing, discussions with consultants, evaluation of patient's response to treatment, examination of patient, obtaining history from patient or surrogate, ordering and performing treatments and interventions, ordering and review of laboratory studies, ordering and review of radiographic studies, pulse oximetry and re-evaluation of patient's condition.    Pricilla Loveless, MD 06/09/20 1034

## 2020-06-09 NOTE — ED Provider Notes (Signed)
TIME SEEN: 6:40 AM  CHIEF COMPLAINT: Chest pain  HPI: Patient is a 39 year old male with history of CAD status post 2 drug-eluting stents one in 2017 and one in 2019, history of medical noncompliance, hypertension, prediabetes who presents emergency department with chest pain that woke him from sleep around 3 AM.  He describes it as a throbbing, tightness in the left side of his chest that did radiate into his arm.  States this feels similar to his anginal equivalent.  No shortness of breath, nausea, vomiting, diaphoresis.  No fevers or cough.  No lower extremity swelling or pain.  No history of PE or DVT.  Cardiologist is Dr. Katrinka Blazing.  Cath 12/17/18:   Prox LAD lesion is 90% stenosed.  A drug-eluting stent was successfully placed.  Post intervention, there is a 0% residual stenosis.  The left ventricular systolic function is normal.  LV end diastolic pressure is normal.  The left ventricular ejection fraction is 55-65% by visual estimate.   ROS: See HPI Constitutional: no fever  Eyes: no drainage  ENT: no runny nose   Cardiovascular:   chest pain  Resp: no SOB  GI: no vomiting GU: no dysuria Integumentary: no rash  Allergy: no hives  Musculoskeletal: no leg swelling  Neurological: no slurred speech ROS otherwise negative  PAST MEDICAL HISTORY/PAST SURGICAL HISTORY:  Past Medical History:  Diagnosis Date  . Asthma   . Coronary artery disease    a. NSTEMI  s/p DES stent to LAD in 09/2016. b. NSTEMI s/p DES to LAD in 11/2018, EF 55-65%.  . H/O noncompliance with medical treatment, presenting hazards to health, due to $   . High cholesterol   . Hypertension   . Migraine    "grew out of them; sports related" (12/17/2018)  . NSTEMI (non-ST elevated myocardial infarction) (HCC) 08/2016; 12/16/2018  . Pre-diabetes   . Torsion, testicular     MEDICATIONS:  Prior to Admission medications   Medication Sig Start Date End Date Taking? Authorizing Provider  aspirin 81 MG EC  tablet Take 1 tablet (81 mg total) by mouth daily. 10/07/16  Yes Danelle Earthly, Tiffany S, PA-C  amLODipine (NORVASC) 5 MG tablet Take 1 tablet (5 mg total) by mouth daily. 06/14/19   Berton Bon, NP  atorvastatin (LIPITOR) 80 MG tablet TAKE 1 TABLET (80 MG TOTAL) BY MOUTH EVERY EVENING. 03/15/19   Barrett, Joline Salt, PA-C  carvedilol (COREG) 6.25 MG tablet Take 1 tablet (6.25 mg total) by mouth 2 (two) times daily with a meal. 06/14/19   Berton Bon, NP  lisinopril (ZESTRIL) 20 MG tablet Take 1 tablet (20 mg total) by mouth daily. 06/14/19   Berton Bon, NP  nitroGLYCERIN (NITROSTAT) 0.4 MG SL tablet Place 1 tablet (0.4 mg total) under the tongue every 5 (five) minutes as needed for chest pain (up to 3 doses). 12/18/18   Tonny Bollman, MD  ticagrelor (BRILINTA) 90 MG TABS tablet Take 1 tablet (90 mg total) by mouth 2 (two) times daily. 06/14/19   Berton Bon, NP    ALLERGIES:  Allergies  Allergen Reactions  . Codeine Shortness Of Breath    SOCIAL HISTORY:  Social History   Tobacco Use  . Smoking status: Former Smoker    Packs/day: 0.40    Years: 23.00    Pack years: 9.20    Types: Cigarettes    Quit date: 12/17/2018    Years since quitting: 1.4  . Smokeless tobacco: Never Used  Substance Use Topics  . Alcohol use:  Yes    Comment: 12/17/2018 "maybe 6 beer/smonth; if that"    FAMILY HISTORY: Family History  Problem Relation Age of Onset  . Hypertension Mother   . Heart attack Sister        2 MI before age 71  . Hypertension Maternal Grandmother     EXAM: BP (!) 163/106 (BP Location: Right Arm)   Pulse 65   Temp 97.8 F (36.6 C)   Resp 17   Wt 104.1 kg   SpO2 100%   BMI 34.90 kg/m  CONSTITUTIONAL: Alert and oriented and responds appropriately to questions. Well-appearing; well-nourished HEAD: Normocephalic EYES: Conjunctivae clear, pupils appear equal, EOM appear intact ENT: normal nose; moist mucous membranes NECK: Supple, normal ROM CARD: RRR; S1 and S2  appreciated; no murmurs, no clicks, no rubs, no gallops RESP: Normal chest excursion without splinting or tachypnea; breath sounds clear and equal bilaterally; no wheezes, no rhonchi, no rales, no hypoxia or respiratory distress, speaking full sentences ABD/GI: Normal bowel sounds; non-distended; soft, non-tender, no rebound, no guarding, no peritoneal signs, no hepatosplenomegaly BACK:  The back appears normal EXT: Normal ROM in all joints; no deformity noted, no edema; no cyanosis, no calf tenderness or calf swelling SKIN: Normal color for age and race; warm; no rash on exposed skin NEURO: Moves all extremities equally PSYCH: The patient's mood and manner are appropriate.   MEDICAL DECISION MAKING: Patient here with complaints of chest pain.  Has significant cardiac history and reports this feels similar to his anginal equivalent.  Last catheterization was in 2019 and he required a stent.  Reports he has not been entirely compliant with his Brilinta.  His EKG today shows no new ischemic change.  He is pain-free after 1 nitroglycerin tablet.  Repeat EKG is stable.  Will obtain cardiac labs, chest x-ray.  I feel patient will need admission given his significant cardiac history.  ED PROGRESS: Patient's labs, imaging reviewed/interpreted.  Patient's first troponin is 8.  His chest x-ray shows no sign of volume overload.  Continues to be pain-free.  Will discuss with medicine at [facility] for admission.  Patient and wife comfortable with this plan.   I reviewed all nursing notes and pertinent previous records as available.  I have reviewed and interpreted any EKGs, lab and urine results, imaging (as available).   EKG Interpretation  Date/Time:  Friday June 09 2020 05:58:40 EDT Ventricular Rate:  58 PR Interval:    QRS Duration: 134 QT Interval:  420 QTC Calculation: 413 R Axis:   -34 Text Interpretation: Sinus rhythm Probable left atrial enlargement IVCD, consider atypical RBBB Inferior infarct, old  Borderline ST elevation, anterolateral leads No significant change since last tracing Confirmed by Lindley Stachnik, Baxter Hire [phone]) on 06/09/2020 6:05:13 AM       EKG Interpretation  Date/Time:  Friday June 09 2020 06:06:30 EDT Ventricular Rate:  64 PR Interval:    QRS Duration: 133 QT Interval:  415 QTC Calculation: 429 R Axis:   -33 Text Interpretation: Sinus rhythm Probable left atrial enlargement IVCD, consider atypical RBBB Inferior infarct, old Borderline ST elevation, anterolateral leads No significant change since last tracing Confirmed by Rochele Raring [phone]) on 06/09/2020 6:41:06 AM         [name] was evaluated in Emergency Department on 06/09/2020 for the symptoms described in the history of present illness. He was evaluated in the context of the global COVID-19 pandemic, which necessitated consideration that the patient might be at risk for infection with the SARS-CoV-2 virus  that causes COVID-19. Institutional protocols and algorithms that pertain to the evaluation of patients at risk for COVID-19 are in a state of rapid change based on information released by regulatory bodies including the CDC and federal and state organizations. These policies and algorithms were followed during the patient's care in the ED.      Vonzella Althaus, Delice Bison, DO 06/09/20 [phone]

## 2020-06-10 ENCOUNTER — Inpatient Hospital Stay (HOSPITAL_COMMUNITY): Payer: 59

## 2020-06-10 DIAGNOSIS — I119 Hypertensive heart disease without heart failure: Secondary | ICD-10-CM

## 2020-06-10 DIAGNOSIS — I214 Non-ST elevation (NSTEMI) myocardial infarction: Secondary | ICD-10-CM

## 2020-06-10 LAB — TROPONIN I (HIGH SENSITIVITY): Troponin I (High Sensitivity): 21 ng/L — ABNORMAL HIGH (ref <18)

## 2020-06-10 LAB — BASIC METABOLIC PANEL
Anion gap: 11 (ref 5–15)
BUN: 10 mg/dL (ref 6–20)
CO2: 24 mmol/L (ref 22–32)
Calcium: 8.8 mg/dL — ABNORMAL LOW (ref 8.9–10.3)
Chloride: 105 mmol/L (ref 98–111)
Creatinine, Ser: 1.03 mg/dL (ref 0.61–1.24)
GFR calc Af Amer: >60 mL/min (ref >60)
GFR calc non Af Amer: >60 mL/min (ref >60)
Glucose, Bld: 102 mg/dL — ABNORMAL HIGH (ref 70–99)
Potassium: 3.8 mmol/L (ref 3.5–5.1)
Sodium: 140 mmol/L (ref 135–145)

## 2020-06-10 LAB — CBC
HCT: 45.8 % (ref 39.0–52.0)
Hemoglobin: 14.7 g/dL (ref 13.0–17.0)
MCH: 28.4 pg (ref 26.0–34.0)
MCHC: 32.1 g/dL (ref 30.0–36.0)
MCV: 88.6 fL (ref 80.0–100.0)
Platelets: 274 10*3/uL (ref 150–400)
RBC: 5.17 MIL/uL (ref 4.22–5.81)
RDW: 13.7 % (ref 11.5–15.5)
WBC: 9.6 10*3/uL (ref 4.0–10.5)
nRBC: 0 % (ref 0.0–0.2)

## 2020-06-10 LAB — ECHOCARDIOGRAM COMPLETE: Weight: 3671.98 oz

## 2020-06-10 LAB — HEPARIN LEVEL (UNFRACTIONATED)
Heparin Unfractionated: 0.47 IU/mL (ref 0.30–0.70)
Heparin Unfractionated: 0.41 IU/mL (ref 0.30–0.70)

## 2020-06-10 NOTE — Progress Notes (Signed)
ANTICOAGULATION CONSULT NOTE - Follow-Up Consult Note  Pharmacy Consult for heparin Indication: chest pain/ACS  Patient Measurements: Weight: 104.1 kg (229 lb 8 oz)  Ideal body weight: 68.4 kg (150 lb 12.7 oz) Adjusted ideal body weight: 82.7 kg (182 lb 4.4 oz)  Heparin Dosing Weight: 90 kg  Vital Signs: Temp: 98 F (36.7 C) (06/12 0530) Temp Source: Oral (06/12 0530) BP: 132/78 (06/12 0901) Pulse Rate: 62 (06/12 0901)  Labs: Recent Labs    06/09/20 0614 06/09/20 0808 06/09/20 1147 06/09/20 1830 06/10/20 0234 06/10/20 0712 06/10/20 0936  HGB 15.2  --   --   --  14.7  --   --   HCT 46.2  --   --   --  45.8  --   --   PLT 293  --   --   --  274  --   --   HEPARINUNFRC  --   --   --  0.21* 0.47  --  0.41  CREATININE 1.02  --   --   --  1.03  --   --   TROPONINIHS 8   < > 49* 48*  --  21*  --    < > = values in this interval not displayed.    Estimated Creatinine Clearance: 110.4 mL/min (by C-G formula based on SCr of 1.03 mg/dL).   Assessment: 60 YOM presenting with CP, hx of CAD s/p PCIx2.  Not on anticoagulation PTA, on DAPT.  CBC wnl. Pharmacy consulted to dose Heparin for anticoagulation.  6/12 AM update: Heparin level this morning is therapeutic (HL 0.41, goal of 0.3-0.7), after receiving heparin gtt at 1200 units/hr. CBC wnl. No bleeding or issues noted per RN.   Goal of Therapy:  Heparin level 0.3-0.7 units/ml Monitor platelets by anticoagulation protocol: Yes   Plan:  - Continue Heparin to 1200 units/hr (12 ml/hr) - Noted plans for cath Monday, 6/14 - Will continue to monitor for any signs/symptoms of bleeding and will follow up with heparin levels daily   Thank you for allowing pharmacy to be a part of this patient's care.  Charlett Nose, PharmD  PGY1 Acute Care Pharmacy Resident  06/10/2020 10:54 AM   **Pharmacist phone directory can now be found on [email or web address] ([email or web address]).  Listed under [facility].

## 2020-06-10 NOTE — Progress Notes (Signed)
*  PRELIMINARY RESULTS* Echocardiogram 2D Echocardiogram has been performed.  Mike Frazier 06/10/2020, 11:46 AM

## 2020-06-10 NOTE — Progress Notes (Signed)
ANTICOAGULATION CONSULT NOTE  Pharmacy Consult for heparin Indication: chest pain/ACS  Assessment: 51 YOM presenting with CP, hx of CAD s/p PCIx2.  Not on anticoagulation PTA, on DAPT.  CBC wnl. Pharmacy consulted to dose Heparin for anticoagulation.  Heparin level now 0.47 units/ml.   Goal of Therapy:  Heparin level 0.3-0.7 units/ml Monitor platelets by anticoagulation protocol: Yes   Plan:  - Continue heparin at 1200 units/hr - Noted plans for cath Monday, 6/14 - Will continue to monitor for any signs/symptoms of bleeding and will follow up with heparin level later today  Thanks for allowing pharmacy to be a part of this patient's care.  Talbert Cage, PharmD Clinical Pharmacist

## 2020-06-10 NOTE — Progress Notes (Signed)
Progress Note   Subjective   Doing well today, the patient denies CP or SOB.  No new concerns  Inpatient Medications    Scheduled Meds: . amLODipine  5 mg Oral Daily  . aspirin EC  81 mg Oral Daily  . atorvastatin  80 mg Oral QPM  . carvedilol  6.25 mg Oral BID WC  . Chlorhexidine Gluconate Cloth  6 each Topical Q0600  . lisinopril  20 mg Oral Daily  . mupirocin ointment  1 application Nasal BID  . sodium chloride flush  3 mL Intravenous Q12H  . sodium chloride flush  3 mL Intravenous Q12H  . ticagrelor  90 mg Oral BID   Continuous Infusions: . sodium chloride    . heparin 1,200 Units/hr (06/10/20 0539)   PRN Meds: sodium chloride, acetaminophen, nitroGLYCERIN, ondansetron (ZOFRAN) IV, sodium chloride flush   Vital Signs    Vitals:   06/09/20 1510 06/09/20 1838 06/10/20 0530 06/10/20 0901  BP: (!) 169/95 (!) 155/93 110/67 132/78  Pulse: 60 64 (!) 58 62  Resp: 18 18 16    Temp: 98.4 F (36.9 C) 98.4 F (36.9 C) 98 F (36.7 C)   TempSrc: Oral Oral Oral   SpO2: 100% 100% 98%   Weight:        Intake/Output Summary (Last 24 hours) at 06/10/2020 1156 Last data filed at 06/10/2020 0600 Gross per 24 hour  Intake 328.05 ml  Output --  Net 328.05 ml   Filed Weights   06/09/20 0601  Weight: 104.1 kg    Telemetry    sinus - Personally Reviewed  Physical Exam   GEN- The patient is well appearing, alert and oriented x 3 today.   Head- normocephalic, atraumatic Eyes-  Sclera clear, conjunctiva pink Ears- hearing intact Oropharynx- clear Neck- supple, Lungs-  normal work of breathing Heart- Regular rate and rhythm  GI- soft  Extremities- no clubbing, cyanosis, or edema  MS- no significant deformity or atrophy Skin- no rash or lesion Psych- euthymic mood, full affect Neuro- strength and sensation are intact   Labs    Chemistry Recent Labs  Lab 06/09/20 0614 06/10/20 0234  NA 139 140  K 3.2* 3.8  CL 102 105  CO2 27 24  GLUCOSE 119* 102*  BUN 8  10  CREATININE 1.02 1.03  CALCIUM 8.5* 8.8*  GFRNONAA >60 >60  GFRAA >60 >60  ANIONGAP 10 11     Hematology Recent Labs  Lab 06/09/20 0614 06/10/20 0234  WBC 8.4 9.6  RBC 5.22 5.17  HGB 15.2 14.7  HCT 46.2 45.8  MCV 88.5 88.6  MCH 29.1 28.4  MCHC 32.9 32.1  RDW 13.5 13.7  PLT 293 274   Echo today reveals EF 55%, mild LVH   Patient ID  Mike Zimmermanis a 39 y.o.malewith a hx of CAD (NSTEMI 2017 w/ DES to LAD, NSTEMI 11/2018 s/p DES to LAD), HTN, HLD, pre-diabetes,formertobaccouse, asthma, obesity, THC use, episodic noncompliancewho is being seen today for the evaluation ofchest pain/elevated troponinat the request of Dr. 12/2018.  Assessment & Plan    1.  CAD/ chest pain worrisome for unstable angina He has known CAD.  Symptoms are worrisome for unstable angina, particularly with prior stenting and noncompliance. We will follow closely and cath on Monday.  2. Hypertensive cardiovascular disease May need to increase amlodipine if BP remains elevated (home meds have just been restarted) No change required today  3. HL Resume high intensity statin dose  Thursday MD, [facility] 06/10/2020  11:56 AM

## 2020-06-11 LAB — HEPARIN LEVEL (UNFRACTIONATED): Heparin Unfractionated: 0.44 IU/mL (ref 0.30–0.70)

## 2020-06-11 LAB — CBC
HCT: 45.5 % (ref 39.0–52.0)
Hemoglobin: 14.6 g/dL (ref 13.0–17.0)
MCH: 28.7 pg (ref 26.0–34.0)
MCHC: 32.1 g/dL (ref 30.0–36.0)
MCV: 89.6 fL (ref 80.0–100.0)
Platelets: 284 10*3/uL (ref 150–400)
RBC: 5.08 MIL/uL (ref 4.22–5.81)
RDW: 13.7 % (ref 11.5–15.5)
WBC: 8.6 10*3/uL (ref 4.0–10.5)
nRBC: 0 % (ref 0.0–0.2)

## 2020-06-11 MED ORDER — SODIUM CHLORIDE 0.9 % WEIGHT BASED INFUSION
1.0000 mL/kg/h | INTRAVENOUS | Status: DC
  Administered 2020-06-12: 1 mL/kg/h via INTRAVENOUS

## 2020-06-11 MED ORDER — SODIUM CHLORIDE 0.9 % WEIGHT BASED INFUSION
3.0000 mL/kg/h | INTRAVENOUS | Status: DC
  Administered 2020-06-12: 3 mL/kg/h via INTRAVENOUS

## 2020-06-11 MED ORDER — ASPIRIN 81 MG PO CHEW
81.0000 mg | CHEWABLE_TABLET | ORAL | Status: AC
  Administered 2020-06-12: 81 mg via ORAL
  Filled 2020-06-11: qty 1

## 2020-06-11 MED ORDER — SODIUM CHLORIDE 0.9 % IV SOLN: 250.0000 mL | INTRAVENOUS | Status: DC | PRN

## 2020-06-11 MED ORDER — SODIUM CHLORIDE 0.9% FLUSH: 3.0000 mL | INTRAVENOUS | Status: DC | PRN

## 2020-06-11 NOTE — Progress Notes (Signed)
Progress Note   Subjective   Doing well today, the patient denies CP or SOB.  No new concerns  Inpatient Medications    Scheduled Meds: . amLODipine  5 mg Oral Daily  . aspirin EC  81 mg Oral Daily  . atorvastatin  80 mg Oral QPM  . carvedilol  6.25 mg Oral BID WC  . Chlorhexidine Gluconate Cloth  6 each Topical Q0600  . lisinopril  20 mg Oral Daily  . mupirocin ointment  1 application Nasal BID  . sodium chloride flush  3 mL Intravenous Q12H  . sodium chloride flush  3 mL Intravenous Q12H  . ticagrelor  90 mg Oral BID   Continuous Infusions: . sodium chloride    . heparin 1,200 Units/hr (06/11/20 0532)   PRN Meds: sodium chloride, acetaminophen, nitroGLYCERIN, ondansetron (ZOFRAN) IV, sodium chloride flush   Vital Signs    Vitals:   06/10/20 2215 06/11/20 0533 06/11/20 0800 06/11/20 0831  BP: 120/60 121/63  (!) 147/90  Pulse: (!) 59 (!) 56  61  Resp: 19 16    Temp: 98.6 F (37 C) 97.9 F (36.6 C)    TempSrc: Oral Oral    SpO2: 100% 98%    Weight:   104.1 kg   Height:   5\' 8"  (1.727 m)     Intake/Output Summary (Last 24 hours) at 06/11/2020 1026 Last data filed at 06/11/2020 0532 Gross per 24 hour  Intake 480 ml  Output --  Net 480 ml   Filed Weights   06/09/20 0601 06/11/20 0800  Weight: 104.1 kg 104.1 kg    Telemetry    sinus - Personally Reviewed  Physical Exam   GEN- The patient is well appearing, alert and oriented x 3 today.   Head- normocephalic, atraumatic Eyes-  Sclera clear, conjunctiva pink Ears- hearing intact Oropharynx- clear Neck- supple, Lungs-  normal work of breathing Heart- Regular rate and rhythm  GI- soft  Extremities- no clubbing, cyanosis, or edema  MS- no significant deformity or atrophy Skin- no rash or lesion Psych- euthymic mood, full affect Neuro- strength and sensation are intact   Labs    Chemistry Recent Labs  Lab 06/09/20 0614 06/10/20 0234  NA 139 140  K 3.2* 3.8  CL 102 105  CO2 27 24  GLUCOSE  119* 102*  BUN 8 10  CREATININE 1.02 1.03  CALCIUM 8.5* 8.8*  GFRNONAA >60 >60  GFRAA >60 >60  ANIONGAP 10 11     Hematology Recent Labs  Lab 06/09/20 0614 06/10/20 0234 06/11/20 0634  WBC 8.4 9.6 8.6  RBC 5.22 5.17 5.08  HGB 15.2 14.7 14.6  HCT 46.2 45.8 45.5  MCV 88.5 88.6 89.6  MCH 29.1 28.4 28.7  MCHC 32.9 32.1 32.1  RDW 13.5 13.7 13.7  PLT 293 274 284     Patient ID   Mike Zimmermanis a 39 y.o.malewith a hx of CAD (NSTEMI 2017 w/ DES to LAD, NSTEMI 11/2018 s/p DES to LAD), HTN, HLD, pre-diabetes,formertobaccouse, asthma, obesity, THC use, episodic noncompliancewho is being seen today for the evaluation ofchest pain/elevated troponinat the request of Dr. 12/2018. Assessment & Plan    1.  CAD/ unstable angina He has known CAD.  Symptoms are worrisome for unstable angina, particularly with prior stenting and noncompliance. I would therefore recommend left heart catheterization with possible PCI.  Discussed the cath with the patient. The patient understands that risks included but are not limited to stroke (1 in 1000), death (1 in  1000), kidney failure [usually temporary] (1 in 500), bleeding (1 in 200), allergic reaction [possibly serious] (1 in 200). The patient understands and agrees to proceed.   2. HTN Stable No change required today  3. HL Continue statin  Thompson Grayer MD, [facility] 06/11/2020 10:26 AM

## 2020-06-11 NOTE — H&P (View-Only) (Signed)
Progress Note   Subjective   Doing well today, the patient denies CP or SOB.  No new concerns  Inpatient Medications    Scheduled Meds: . amLODipine  5 mg Oral Daily  . aspirin EC  81 mg Oral Daily  . atorvastatin  80 mg Oral QPM  . carvedilol  6.25 mg Oral BID WC  . Chlorhexidine Gluconate Cloth  6 each Topical Q0600  . lisinopril  20 mg Oral Daily  . mupirocin ointment  1 application Nasal BID  . sodium chloride flush  3 mL Intravenous Q12H  . sodium chloride flush  3 mL Intravenous Q12H  . ticagrelor  90 mg Oral BID   Continuous Infusions: . sodium chloride    . heparin 1,200 Units/hr (06/11/20 0532)   PRN Meds: sodium chloride, acetaminophen, nitroGLYCERIN, ondansetron (ZOFRAN) IV, sodium chloride flush   Vital Signs    Vitals:   06/10/20 2215 06/11/20 0533 06/11/20 0800 06/11/20 0831  BP: 120/60 121/63  (!) 147/90  Pulse: (!) 59 (!) 56  61  Resp: 19 16    Temp: 98.6 F (37 C) 97.9 F (36.6 C)    TempSrc: Oral Oral    SpO2: 100% 98%    Weight:   104.1 kg   Height:   5\' 8"  (1.727 m)     Intake/Output Summary (Last 24 hours) at 06/11/2020 1026 Last data filed at 06/11/2020 0532 Gross per 24 hour  Intake 480 ml  Output --  Net 480 ml   Filed Weights   06/09/20 0601 06/11/20 0800  Weight: 104.1 kg 104.1 kg    Telemetry    sinus - Personally Reviewed  Physical Exam   GEN- The patient is well appearing, alert and oriented x 3 today.   Head- normocephalic, atraumatic Eyes-  Sclera clear, conjunctiva pink Ears- hearing intact Oropharynx- clear Neck- supple, Lungs-  normal work of breathing Heart- Regular rate and rhythm  GI- soft  Extremities- no clubbing, cyanosis, or edema  MS- no significant deformity or atrophy Skin- no rash or lesion Psych- euthymic mood, full affect Neuro- strength and sensation are intact   Labs    Chemistry Recent Labs  Lab 06/09/20 0614 06/10/20 0234  NA 139 140  K 3.2* 3.8  CL 102 105  CO2 27 24  GLUCOSE  119* 102*  BUN 8 10  CREATININE 1.02 1.03  CALCIUM 8.5* 8.8*  GFRNONAA >60 >60  GFRAA >60 >60  ANIONGAP 10 11     Hematology Recent Labs  Lab 06/09/20 0614 06/10/20 0234 06/11/20 0634  WBC 8.4 9.6 8.6  RBC 5.22 5.17 5.08  HGB 15.2 14.7 14.6  HCT 46.2 45.8 45.5  MCV 88.5 88.6 89.6  MCH 29.1 28.4 28.7  MCHC 32.9 32.1 32.1  RDW 13.5 13.7 13.7  PLT 293 274 284     Patient ID   Mike Zimmermanis a 39 y.o.malewith a hx of CAD (NSTEMI 2017 w/ DES to LAD, NSTEMI 11/2018 s/p DES to LAD), HTN, HLD, pre-diabetes,formertobaccouse, asthma, obesity, THC use, episodic noncompliancewho is being seen today for the evaluation ofchest pain/elevated troponinat the request of Dr. 12/2018. Assessment & Plan    1.  CAD/ unstable angina He has known CAD.  Symptoms are worrisome for unstable angina, particularly with prior stenting and noncompliance. I would therefore recommend left heart catheterization with possible PCI.  Discussed the cath with the patient. The patient understands that risks included but are not limited to stroke (1 in 1000), death (1 in  1000), kidney failure [usually temporary] (1 in 500), bleeding (1 in 200), allergic reaction [possibly serious] (1 in 200). The patient understands and agrees to proceed.   2. HTN Stable No change required today  3. HL Continue statin  Mike Scurlock MD, FACC 06/11/2020 10:26 AM  

## 2020-06-11 NOTE — Progress Notes (Signed)
ANTICOAGULATION CONSULT NOTE - Follow-Up Consult Note  Pharmacy Consult for heparin Indication: chest pain/ACS  Patient Measurements: Weight: 104.1 kg (229 lb 8 oz)  Ideal body weight: 68.4 kg (150 lb 12.7 oz) Adjusted ideal body weight: 82.7 kg (182 lb 4.4 oz)  Heparin Dosing Weight: 91.1 kg  Vital Signs: Temp: 97.9 F (36.6 C) (06/13 0533) Temp Source: Oral (06/13 0533) BP: 121/63 (06/13 0533) Pulse Rate: 56 (06/13 0533)  Labs: Recent Labs     0000 06/09/20 0614 06/09/20 0808 06/09/20 1147 06/09/20 1830 06/09/20 1830 06/10/20 0234 06/10/20 0712 06/10/20 0936 06/11/20 0634  HGB   < > 15.2  --   --   --   --  14.7  --   --  14.6  HCT  --  46.2  --   --   --   --  45.8  --   --  45.5  PLT  --  293  --   --   --   --  274  --   --  284  HEPARINUNFRC  --   --   --   --  0.21*   < > 0.47  --  0.41 0.44  CREATININE  --  1.02  --   --   --   --  1.03  --   --   --   TROPONINIHS  --  8   < > 49* 48*  --   --  21*  --   --    < > = values in this interval not displayed.    Estimated Creatinine Clearance: 110.4 mL/min (by C-G formula based on SCr of 1.03 mg/dL).   Assessment: 69 YOM presenting with CP, hx of CAD s/p PCIx2.  Not on anticoagulation PTA, on DAPT.  CBC wnl. Pharmacy consulted to dose Heparin for anticoagulation.  6/12 AM update: Heparin level this morning is therapeutic (HL 0.44, goal of 0.3-0.7), after receiving heparin gtt at 1200 units/hr. CBC wnl. No bleeding or issues noted per RN.   Goal of Therapy:  Heparin level 0.3-0.7 units/ml Monitor platelets by anticoagulation protocol: Yes   Plan:  - Continue Heparin to 1200 units/hr (12 ml/hr) - Noted plans for cath Monday, 6/14 - Will continue to monitor for any signs/symptoms of bleeding and will follow up with heparin levels daily   Thank you for allowing pharmacy to be a part of this patients care.  Charlett Nose, PharmD  PGY1 Acute Care Pharmacy Resident 06/11/2020 8:06 AM   **Pharmacist phone  directory can now be found on [email or web address] ([email or web address]).  Listed under [facility].

## 2020-06-12 ENCOUNTER — Encounter (HOSPITAL_COMMUNITY)
Admission: AD | Disposition: A | Discharge status: Home / Self Care | Payer: Self-pay | Source: Home / Self Care | Attending: Cardiothoracic Surgery

## 2020-06-12 ENCOUNTER — Other Ambulatory Visit: Payer: Self-pay | Admitting: *Deleted

## 2020-06-12 ENCOUNTER — Encounter (HOSPITAL_COMMUNITY): Payer: Self-pay | Admitting: Cardiovascular Disease

## 2020-06-12 DIAGNOSIS — I10 Essential (primary) hypertension: Secondary | ICD-10-CM

## 2020-06-12 DIAGNOSIS — I25110 Atherosclerotic heart disease of native coronary artery with unstable angina pectoris: Secondary | ICD-10-CM

## 2020-06-12 DIAGNOSIS — I2510 Atherosclerotic heart disease of native coronary artery without angina pectoris: Secondary | ICD-10-CM

## 2020-06-12 HISTORY — PX: LEFT HEART CATH AND CORONARY ANGIOGRAPHY: CATH118249

## 2020-06-12 LAB — CBC
HCT: 45.3 % (ref 39.0–52.0)
Hemoglobin: 14.7 g/dL (ref 13.0–17.0)
MCH: 28.9 pg (ref 26.0–34.0)
MCHC: 32.5 g/dL (ref 30.0–36.0)
MCV: 89 fL (ref 80.0–100.0)
Platelets: 273 10*3/uL (ref 150–400)
RBC: 5.09 MIL/uL (ref 4.22–5.81)
RDW: 13.6 % (ref 11.5–15.5)
WBC: 8 10*3/uL (ref 4.0–10.5)
nRBC: 0 % (ref 0.0–0.2)

## 2020-06-12 LAB — HEPARIN LEVEL (UNFRACTIONATED): Heparin Unfractionated: 0.44 IU/mL (ref 0.30–0.70)

## 2020-06-12 SURGERY — LEFT HEART CATH AND CORONARY ANGIOGRAPHY
Anesthesia: LOCAL

## 2020-06-12 MED ORDER — SODIUM CHLORIDE 0.9% FLUSH: 3.0000 mL | INTRAVENOUS | Status: DC | PRN

## 2020-06-12 MED ORDER — VERAPAMIL HCL 2.5 MG/ML IV SOLN
INTRAVENOUS | Status: AC
  Filled 2020-06-12: qty 2

## 2020-06-12 MED ORDER — SODIUM CHLORIDE 0.9 % WEIGHT BASED INFUSION: 1.0000 mL/kg/h | INTRAVENOUS | Status: AC

## 2020-06-12 MED ORDER — HEPARIN SODIUM (PORCINE) 1000 UNIT/ML IJ SOLN
INTRAMUSCULAR | Status: AC
  Filled 2020-06-12: qty 1

## 2020-06-12 MED ORDER — LIDOCAINE HCL (PF) 1 % IJ SOLN
INTRAMUSCULAR | Status: DC | PRN
  Administered 2020-06-12: 2 mL

## 2020-06-12 MED ORDER — SODIUM CHLORIDE 0.9 % IV SOLN: 250.0000 mL | INTRAVENOUS | Status: DC | PRN

## 2020-06-12 MED ORDER — FENTANYL CITRATE (PF) 100 MCG/2ML IJ SOLN
INTRAMUSCULAR | Status: DC | PRN
  Administered 2020-06-12: 25 ug via INTRAVENOUS

## 2020-06-12 MED ORDER — SODIUM CHLORIDE 0.9% FLUSH
3.0000 mL | Freq: Two times a day (BID) | INTRAVENOUS | Status: DC
  Administered 2020-06-13: 3 mL via INTRAVENOUS

## 2020-06-12 MED ORDER — FENTANYL CITRATE (PF) 100 MCG/2ML IJ SOLN
INTRAMUSCULAR | Status: AC
  Filled 2020-06-12: qty 2

## 2020-06-12 MED ORDER — HEPARIN (PORCINE) 25000 UT/250ML-% IV SOLN
1200.0000 [IU]/h | INTRAVENOUS | Status: DC
  Administered 2020-06-12 – 2020-06-15 (×4): 1200 [IU]/h via INTRAVENOUS
  Filled 2020-06-12 (×4): qty 250

## 2020-06-12 MED ORDER — HYDRALAZINE HCL 20 MG/ML IJ SOLN: 10.0000 mg | INTRAMUSCULAR | Status: AC | PRN

## 2020-06-12 MED ORDER — MIDAZOLAM HCL 2 MG/2ML IJ SOLN
INTRAMUSCULAR | Status: DC | PRN
  Administered 2020-06-12: 2 mg via INTRAVENOUS

## 2020-06-12 MED ORDER — MIDAZOLAM HCL 2 MG/2ML IJ SOLN
INTRAMUSCULAR | Status: AC
  Filled 2020-06-12: qty 2

## 2020-06-12 MED ORDER — IOHEXOL 350 MG/ML SOLN
INTRAVENOUS | Status: DC | PRN
  Administered 2020-06-12: 35 mL

## 2020-06-12 MED ORDER — HEPARIN SODIUM (PORCINE) 1000 UNIT/ML IJ SOLN
INTRAMUSCULAR | Status: DC | PRN
  Administered 2020-06-12: 5000 [IU] via INTRAVENOUS

## 2020-06-12 MED ORDER — LABETALOL HCL 5 MG/ML IV SOLN: 10.0000 mg | INTRAVENOUS | Status: AC | PRN

## 2020-06-12 MED ORDER — VERAPAMIL HCL 2.5 MG/ML IV SOLN
INTRAVENOUS | Status: DC | PRN
  Administered 2020-06-12: 10 mL via INTRA_ARTERIAL

## 2020-06-12 MED ORDER — HEPARIN (PORCINE) IN NACL 1000-0.9 UT/500ML-% IV SOLN
INTRAVENOUS | Status: DC | PRN
  Administered 2020-06-12 (×2): 500 mL

## 2020-06-12 MED ORDER — HEPARIN (PORCINE) IN NACL 1000-0.9 UT/500ML-% IV SOLN
INTRAVENOUS | Status: AC
  Filled 2020-06-12: qty 1000

## 2020-06-12 MED ORDER — LIDOCAINE HCL (PF) 1 % IJ SOLN
INTRAMUSCULAR | Status: AC
  Filled 2020-06-12: qty 30

## 2020-06-12 SURGICAL SUPPLY — 9 items

## 2020-06-12 NOTE — Interval H&P Note (Signed)
Cath Lab Visit (complete for each Cath Lab visit)  Clinical Evaluation Leading to the Procedure:   ACS: Yes.    Non-ACS:    Anginal Classification: CCS IV  Anti-ischemic medical therapy: Maximal Therapy (2 or more classes of medications)  Non-Invasive Test Results: No non-invasive testing performed  Prior CABG: No previous CABG      History and Physical Interval Note:  06/12/2020 9:18 AM  Mike Frazier  has presented today for surgery, with the diagnosis of possible nonstemi.  The various methods of treatment have been discussed with the patient and family. After consideration of risks, benefits and other options for treatment, the patient has consented to  Procedure(s): LEFT HEART CATH AND CORONARY ANGIOGRAPHY (N/A) as a surgical intervention.  The patient's history has been reviewed, patient examined, no change in status, stable for surgery.  I have reviewed the patient's chart and labs.  Questions were answered to the patient's satisfaction.     Tonny Bollman

## 2020-06-12 NOTE — Consult Note (Signed)
[address]       [place] [address]             [phone]                    Teng Decou [facility] Medical Record #[id number] Date of Birth: 1981-06-02  Referring:Dr Excell Seltzer  Primary Care: Bing Neighbors, FNP Primary Cardiologist: Lesleigh Noe, MD  Chief Complaint:    Chief Complaint  Patient presents with  . Chest Pain    History of Present Illness:    Mike Frazier 39 y.o. male is seen in the Cath Lab holding area  today for evaluation of possible coronary artery bypass graft.  The patient originally presented with acute coronary symptoms in 2017.  At that time DES stent was placed by Dr. Katrinka Blazing in the proximal LAD.  The patient was not known to be diabetic, he did smoke but quit at that time in 2017.  He presented again in December 2020 with similar symptoms, cath by Dr. Gery Pray restenosis of the stent was noted and repeat angioplasty stent placement was done.  The patient has been maintained on Brilinta.  3 days ago he presented to the [facility] emergency room with pain in his chest radiating to his left arm woke him from sleep during the night.  He was admitted , notes by the time he was seen in the emergency room his pain had resolved.  He was admitted and underwent cardiac catheterization today by Dr. Excell Seltzer.   Patient has known hypertension.  He notes that he has not been as diligent on medications is a good sometimes missing afternoon dose.  No significant family history of coronary artery disease, his sister myocardial infarction x2 before the age of 58.   Cath/ DES  stent  LAD 09/30/2016 - Dr Katrinka Blazing Cath / stent for restenosis 12/18/2019 - Dr Allyson Sabal   Current Activity/ Functional Status:  Patient is independent with mobility/ambulation, transfers, ADL's, IADL's.   Zubrod Score: At the time of surgery this patient's most appropriate activity status/level should be described as: [x]     0    Normal activity, no symptoms []     1     Restricted in physical strenuous activity but ambulatory, able to do out light work []     2    Ambulatory and capable of self care, unable to do work activities, up and about               >50 % of waking hours                              []     3    Only limited self care, in bed greater than 50% of waking hours []     4    Completely disabled, no self care, confined to bed or chair []     5    Moribund   Past Medical History:  Diagnosis Date  . Asthma   . Coronary artery disease    a. NSTEMI  s/p DES stent to LAD in 09/2016. b. NSTEMI s/p DES to LAD in 11/2018, EF 55-65%.  . High cholesterol   . History of noncompliance with medical treatment, presenting hazards to health   . Hypertension   . Migraine    "grew out of them; sports related" (12/17/2018)  . NSTEMI (non-ST elevated myocardial infarction) (HCC) 08/2016;  12/16/2018  . Pre-diabetes   . Torsion, testicular     Past Surgical History:  Procedure Laterality Date  . CARDIAC CATHETERIZATION N/A 09/30/2016   Procedure: Left Heart Cath and Coronary Angiography;  Surgeon: Lyn Records, MD;  Location: [facility] INVASIVE CV LAB;  Service: Cardiovascular;  Laterality: N/A;  . CARDIAC CATHETERIZATION N/A 09/30/2016   Procedure: Intravascular Pressure Wire/FFR Study;  Surgeon: Lyn Records, MD;  Location: [facility] INVASIVE CV LAB;  Service: Cardiovascular;  Laterality: N/A;  . CARDIAC CATHETERIZATION N/A 09/30/2016   Procedure: Coronary Stent Intervention;  Surgeon: Lyn Records, MD;  Location: [facility] INVASIVE CV LAB;  Service: Cardiovascular;  Laterality: N/A;  . CORONARY ANGIOPLASTY WITH STENT PLACEMENT  12/17/2018  . I & D EXTREMITY  10/21/2012   Procedure: IRRIGATION AND DEBRIDEMENT EXTREMITY;  Surgeon: Marlowe Shores, MD;  Location: [facility] OR;  Service: Orthopedics;  Laterality: Left;  . INCISION / DRAINAGE HAND / FINGER Right    "bit by spider twice; once on left thumb; once on right middle finger" (12/17/2018  . LEFT HEART CATH AND CORONARY  ANGIOGRAPHY N/A 12/17/2018   Procedure: LEFT HEART CATH AND CORONARY ANGIOGRAPHY;  Surgeon: Runell Gess, MD;  Location: [facility] INVASIVE CV LAB;  Service: Cardiovascular;  Laterality: N/A;  . TESTICLE TORSION REDUCTION  ~ 1997    Family History  Problem Relation Age of Onset  . Hypertension Mother   . Heart attack Sister        2 MI before age 40  . Hypertension Maternal Grandmother      Social History   Tobacco Use  Smoking Status Former Smoker  . Packs/day: 0.40  . Years: 23.00  . Pack years: 9.20  . Types: Cigarettes  . Quit date: 12/17/2018  . 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 Codeine Shortness Of Breath    Current Facility-Administered Medications  Medication Dose Route Frequency Provider Last Rate Last Admin  . 0.9 %  sodium chloride infusion  250 mL Intravenous PRN Dunn, Dayna N, PA-C      . [MAR Hold] 0.9 %  sodium chloride infusion  250 mL Intravenous PRN Dunn, Dayna N, PA-C      . 0.9% sodium chloride infusion  1 mL/kg/hr Intravenous Continuous Dunn, Dayna N, PA-C 104.1 mL/hr at 06/12/20 0601 1 mL/kg/hr at 06/12/20 0601  . 0.9% sodium chloride infusion  1 mL/kg/hr Intravenous Continuous Tonny Bollman, MD 101.3 mL/hr at 06/12/20 1018 1 mL/kg/hr at 06/12/20 1018  . [MAR Hold] acetaminophen (TYLENOL) tablet 650 mg  650 mg Oral Q4H PRN Dunn, Dayna N, PA-C      . [MAR Hold] amLODipine (NORVASC) tablet 5 mg  5 mg Oral Daily Dunn, Dayna N, PA-C   5 mg at 06/12/20 1610  . [MAR Hold] aspirin EC tablet 81 mg  81 mg Oral Daily Dunn, Dayna N, PA-C   81 mg at 06/12/20 9604  . [MAR Hold] atorvastatin (LIPITOR) tablet 80 mg  80 mg  Oral QPM Dunn, Dayna N, PA-C   80 mg at 06/11/20 1704  . [MAR Hold] carvedilol (COREG) tablet 6.25 mg  6.25 mg Oral BID WC Dunn, Kriste Basque  N, PA-C   6.25 mg at 06/12/20 0830  . [MAR Hold] Chlorhexidine Gluconate Cloth 2 % PADS 6 each  6 each Topical Q0600 O'Neal, Ronnald Ramp, MD   6 each at 06/12/20 0744  . heparin ADULT infusion 100 units/mL (25000 units/238mL sodium chloride 0.45%)  1,200 Units/hr Intravenous Continuous Ann Held, [facility] 12 mL/hr at 06/11/20 0532 1,200 Units/hr at 06/11/20 0532  . [MAR Hold] lisinopril (ZESTRIL) tablet 20 mg  20 mg Oral Daily Dunn, Dayna N, PA-C   20 mg at 06/12/20 9518  . [MAR Hold] mupirocin ointment (BACTROBAN) 2 % 1 application  1 application Nasal BID O'Neal, Ronnald Ramp, MD   1 application at 06/12/20 0831  . [MAR Hold] nitroGLYCERIN (NITROSTAT) SL tablet 0.4 mg  0.4 mg Sublingual Q5 min PRN Dunn, Dayna N, PA-C   0.4 mg at 06/09/20 0631  . [MAR Hold] ondansetron (ZOFRAN) injection 4 mg  4 mg Intravenous Q6H PRN Dunn, Dayna N, PA-C      . [MAR Hold] sodium chloride flush (NS) 0.9 % injection 3 mL  3 mL Intravenous Q12H Dunn, Dayna N, PA-C   3 mL at 06/12/20 0830  . sodium chloride flush (NS) 0.9 % injection 3 mL  3 mL Intravenous PRN Dunn, Dayna N, PA-C      . [MAR Hold] sodium chloride flush (NS) 0.9 % injection 3 mL  3 mL Intravenous Q12H Dunn, Dayna N, PA-C   3 mL at 06/11/20 0834  . [MAR Hold] sodium chloride flush (NS) 0.9 % injection 3 mL  3 mL Intravenous PRN Ronie Spies N, PA-C   3 mL at 06/09/20 2119  . [MAR Hold] ticagrelor (BRILINTA) tablet 90 mg  90 mg Oral BID Laurann Montana, PA-C   90 mg at 06/12/20 0502      Review of Systems:     Cardiac Review of Systems: [Y] = yes  or   [ N ] = no   Chest Pain [ y   ]  Resting SOB [  n ] Exertional SOB  [n  ]  Orthopnea [ n ]   Pedal Edema [ n  ]    Palpitations [n  ] Syncope  [ n ]   Presyncope [ n  ]   General Review of Systems: [Y] = yes [  ]=no Constitional: recent weight change [  ];  Wt  loss over the last 3 months [   ] anorexia [  ]; fatigue [  ]; nausea [  ]; night sweats [  ]; fever [  ]; or chills [  ];           Eye : blurred vision [  ]; diplopia [   ]; vision changes [  ];  Amaurosis fugax[  ]; Resp: cough [  ];  wheezing[  ];  hemoptysis[  ]; shortness of breath[  ]; paroxysmal nocturnal dyspnea[  ]; dyspnea on exertion[  ]; or orthopnea[  ];  GI:  gallstones[  ], vomiting[  ];  dysphagia[  ]; melena[  ];  hematochezia [  ]; heartburn[  ];   Hx of  Colonoscopy[  ]; GU: kidney stones [  ]; hematuria[  ];   dysuria [  ];  nocturia[  ];  history of     obstruction [  ]; urinary frequency [  ]             Skin: rash, swelling[  ];, hair loss[  ];  peripheral edema[  ];  or itching[  ]; Musculosketetal: myalgias[  ];  joint swelling[  ];  joint erythema[  ];  joint pain[  ];  back pain[  ];  Heme/Lymph: bruising[  ];  bleeding[  ];  anemia[  ];  Neuro: TIA[  ];  headaches[  ];  stroke[  ];  vertigo[  ];  seizures[  ];   paresthesias[  ];  difficulty walking[  ];  Psych:depression[  ]; anxiety[  ];  Endocrine: diabetes[ told border line no treatment  ];  thyroid dysfunction[  ];  Immunizations: Flu up to date [  ]; Pneumococcal up to date [  ];  Other: right leg pain several months ago      PHYSICAL EXAMINATION: BP (!) 153/91   Pulse 63   Temp 97.9 F (36.6 C) (Oral)   Resp 20   Ht 5\' 8"  (1.727 m)   Wt 101.3 kg   SpO2 99%   BMI 33.95 kg/m  General appearance: alert, cooperative and no distress Head: Normocephalic, without obvious abnormality, atraumatic Neck: no adenopathy, no carotid bruit, no JVD, supple, symmetrical, trachea midline and thyroid not enlarged, symmetric, no tenderness/mass/nodules Lymph nodes: Cervical, supraclavicular, and axillary nodes normal. Resp: clear to auscultation bilaterally Cardio: regular rate and rhythm, S1, S2 normal, no murmur, click, rub or gallop GI: soft, non-tender; bowel sounds normal; no masses,  no  organomegaly Extremities: extremities normal, atraumatic, no cyanosis or edema and Homans sign is negative, no sign of DVT Neurologic: Grossly normal Pedal pulses DP and PT bilaterally are intact   diagnostic Studies & Laboratory data:     Recent Radiology Findings:   DG Chest 2 View  Result Date: 06/09/2020 CLINICAL DATA:  Left shoulder and chest pain for 1 hour EXAM: CHEST - 2 VIEW COMPARISON:  12/16/2018 CTA FINDINGS: Normal heart size and mediastinal contours. No acute infiltrate or edema. No effusion or pneumothorax. No acute osseous findings. IMPRESSION: Negative chest Electronically Signed   By: Marnee SpringJonathon  Watts M.D.   On: 06/09/2020 07:15   CARDIAC CATHETERIZATION  Result Date: 06/12/2020 Dr.  Excell Seltzerooper  1. Severe single vessel CAD with severe diffuse in-stent restenosis of the proximal LAD, including the proximal and distal stent margins 2. Mild nonobstructive RCA stenosis 3. Patent left main/left circumflex 4. Elevated LVEDP recorded at 27 mmHg but visually appears approximately 20 mmHg Recommend: TCTS consult for consideration of LIMA-LAD grafting. If patient declines CABG would offer PCI of the LAD, but I think long-term patency rates would be low and he would be at high-risk of recurrent LAD-territory ischemia.    I have independently reviewed the above  cath films and reviewed the findings with the  patient .    ECHOCARDIOGRAM COMPLETE  Result Date: 06/10/2020    ECHOCARDIOGRAM REPORT   Patient Name:   Mike Frazier Date of Exam: 06/10/2020 Medical Rec #:  [id number]        Height:       68.0 in Accession #:    [id number]       Weight:       229.5 lb Date of Birth:  01/29/1981         BSA:          2.167 m Patient Age:    39 years         BP:           132/78 mmHg Patient Gender: M  HR:           62 bpm. Exam Location:  Inpatient Procedure: 2D Echo Indications:    NSTEMI I21.4  History:        Patient has prior history of Echocardiogram examinations, most                  recent 09/30/2016. Previous Myocardial Infarction and CAD,                 Signs/Symptoms:Chest Pain; Risk Factors:Hypertension, Former                 Smoker and Dyslipidemia.  Sonographer:    Jeryl Columbia Referring Phys: [id number] DAYNA N DUNN IMPRESSIONS  1. Left ventricular ejection fraction, by estimation, is 55 to 60%. The left ventricle has normal function. The left ventricle has no regional wall motion abnormalities. There is mild left ventricular hypertrophy. Left ventricular diastolic parameters were normal.  2. Right ventricular systolic function is normal. The right ventricular size is normal.  3. The mitral valve is normal in structure. No evidence of mitral valve regurgitation. No evidence of mitral stenosis.  4. The aortic valve is tricuspid. Aortic valve regurgitation is not visualized. No aortic stenosis is present.  5. The inferior vena cava is normal in size with greater than 50% respiratory variability, suggesting right atrial pressure of 3 mmHg. FINDINGS  Left Ventricle: Left ventricular ejection fraction, by estimation, is 55 to 60%. The left ventricle has normal function. The left ventricle has no regional wall motion abnormalities. The left ventricular internal cavity size was normal in size. There is  mild left ventricular hypertrophy. Left ventricular diastolic parameters were normal. Right Ventricle: The right ventricular size is normal. No increase in right ventricular wall thickness. Right ventricular systolic function is normal. Left Atrium: Left atrial size was normal in size. Right Atrium: Right atrial size was normal in size. Pericardium: There is no evidence of pericardial effusion. Mitral Valve: The mitral valve is normal in structure. Normal mobility of the mitral valve leaflets. No evidence of mitral valve regurgitation. No evidence of mitral valve stenosis. Tricuspid Valve: The tricuspid valve is normal in structure. Tricuspid valve regurgitation is not demonstrated. No evidence of  tricuspid stenosis. Aortic Valve: The aortic valve is tricuspid. Aortic valve regurgitation is not visualized. No aortic stenosis is present. Pulmonic Valve: The pulmonic valve was normal in structure. Pulmonic valve regurgitation is not visualized. No evidence of pulmonic stenosis. Aorta: The aortic root is normal in size and structure. Venous: The inferior vena cava is normal in size with greater than 50% respiratory variability, suggesting right atrial pressure of 3 mmHg. IAS/Shunts: No atrial level shunt detected by color flow Doppler.  LEFT VENTRICLE PLAX 2D LVIDd:         4.60 cm  Diastology LVIDs:         3.20 cm  LV e' lateral:   12.20 cm/s LV PW:         1.50 cm  LV E/e' lateral: 7.6 LV IVS:        1.20 cm  LV e' medial:    8.59 cm/s LVOT diam:     2.00 cm  LV E/e' medial:  10.8 LVOT Area:     3.14 cm  RIGHT VENTRICLE RV S prime:     12.20 cm/s TAPSE (M-mode): 2.1 cm LEFT ATRIUM             Index       RIGHT ATRIUM  Index LA diam:        3.50 cm 1.62 cm/m  RA Area:     20.80 cm LA Vol (A2C):   52.9 ml 24.41 ml/m RA Volume:   71.20 ml  32.86 ml/m LA Vol (A4C):   33.4 ml 15.41 ml/m LA Biplane Vol: 42.4 ml 19.57 ml/m   AORTA Ao Root diam: 3.00 cm MITRAL VALVE MV Area (PHT): 2.80 cm    SHUNTS MV Decel Time: 271 msec    Systemic Diam: 2.00 cm MV E velocity: 93.00 cm/s MV A velocity: 51.00 cm/s MV E/A ratio:  1.82 Jenkins Rouge MD Electronically signed by Jenkins Rouge MD Signature Date/Time: 06/10/2020/12:18:29 PM    Final      I have independently reviewed the above radiology studies  and reviewed the findings with the patient.   Recent Lab Findings: Lab Results  Component Value Date   WBC 8.0 06/12/2020   HGB 14.7 06/12/2020   HCT 45.3 06/12/2020   PLT 273 06/12/2020   GLUCOSE 102 (H) 06/10/2020   CHOL 137 01/20/2020   TRIG 105 01/20/2020   HDL 49 01/20/2020   LDLCALC 69 01/20/2020   ALT 16 01/20/2020   AST 16 01/20/2020   NA 140 06/10/2020   K 3.8 06/10/2020   CL 105  06/10/2020   CREATININE 1.03 06/10/2020   BUN 10 06/10/2020   CO2 24 06/10/2020   TSH 0.512 01/20/2020   INR 1.05 09/30/2016   HGBA1C 6.3 (H) 06/09/2020   Cardiac Panel (last 3 results) Recent Labs    06/09/20 1830 06/10/20 0712  TROPONINIHS 48* 21*    Assessment / Plan:   #1 premature coronary artery disease with restenosis of stented proximal LAD x2-patient's cardiac cath films have been reviewed and his history discussed with him.  Agree with Dr. Burt Knack that further attempts at angioplasty/restenting of the proximal LAD will likely result in a poor outcome.  I discussed the pros and cons of proceeding with coronary artery bypass grafting to the LAD with the patient in detail.  Risks options and expectations were discussed.  He is willing to proceed.  Patient has been on Brilinta, taking dose this morning.  We will consider possible surgery on Friday with Brilinta washout. #2 history of hypertension-on medication    Grace Isaac MD      [address] [place] [address] Office [phone]     06/12/2020 11:09 AM

## 2020-06-12 NOTE — Progress Notes (Addendum)
Progress Note  Patient Name: Mike Frazier Date of Encounter: 06/12/2020  Primary Cardiologist: Sinclair Grooms, MD   Subjective   Doing well this AM. No recurrent chest pain. Anticipate cath today   Inpatient Medications    Scheduled Meds: . amLODipine  5 mg Oral Daily  . aspirin EC  81 mg Oral Daily  . atorvastatin  80 mg Oral QPM  . carvedilol  6.25 mg Oral BID WC  . Chlorhexidine Gluconate Cloth  6 each Topical Q0600  . lisinopril  20 mg Oral Daily  . mupirocin ointment  1 application Nasal BID  . sodium chloride flush  3 mL Intravenous Q12H  . sodium chloride flush  3 mL Intravenous Q12H  . ticagrelor  90 mg Oral BID   Continuous Infusions: . sodium chloride    . sodium chloride    . sodium chloride 1 mL/kg/hr (06/12/20 0601)  . heparin 1,200 Units/hr (06/11/20 0532)   PRN Meds: sodium chloride, sodium chloride, acetaminophen, nitroGLYCERIN, ondansetron (ZOFRAN) IV, sodium chloride flush, sodium chloride flush   Vital Signs    Vitals:   06/11/20 1752 06/11/20 2341 06/12/20 0520 06/12/20 0739  BP: (!) 145/71 133/64 113/69   Pulse: 60 61 (!) 56   Resp: _0 Temp: 98.5 F (36.9 C) 98 F (36.7 C) 97.9 F (36.6 C)   TempSrc: Oral Oral Oral   SpO2: 100% 100% 97%   Weight:    101.3 kg  Height:        Intake/Output Summary (Last 24 hours) at 06/12/2020 0859 Last data filed at 06/12/2020 0830 Gross per 24 hour  Intake 738.69 ml  Output 1400 ml  Net -661.31 ml   Filed Weights   06/09/20 0601 06/11/20 0800 06/12/20 0739  Weight: 104.1 kg 104.1 kg 101.3 kg    Physical Exam   General: Well developed, well nourished, NAD Neck: Negative for carotid bruits. No JVD Lungs:Clear to ausculation bilaterally. No wheezes. Breathing is unlabored. Cardiovascular: RRR with S1 S2. No murmurs Extremities: No edema. Radial pulses 2+ bilaterally Neuro: Alert and oriented. No focal deficits. No facial asymmetry. MAE spontaneously. Psych: Responds to  questions appropriately with normal affect.    Labs    Chemistry Recent Labs  Lab 06/09/20 0614 06/10/20 0234  NA 139 140  K 3.2* 3.8  CL 102 105  CO2 27 24  GLUCOSE 119* 102*  BUN 8 10  CREATININE 1.02 1.03  CALCIUM 8.5* 8.8*  GFRNONAA >60 >60  GFRAA >60 >60  ANIONGAP 10 11     Hematology Recent Labs  Lab 06/10/20 0234 06/11/20 0634 06/12/20 0756  WBC 9.6 8.6 8.0  RBC 5.17 5.08 5.09  HGB 14.7 14.6 14.7  HCT 45.8 45.5 45.3  MCV 88.6 89.6 89.0  MCH 28.4 28.7 28.9  MCHC 32.1 32.1 32.5  RDW 13.7 13.7 13.6  PLT 274 284 273    Cardiac EnzymesNo results for input(s): TROPONINI in the last 168 hours. No results for input(s): TROPIPOC in the last 168 hours.   BNPNo results for input(s): BNP, PROBNP in the last 168 hours.   DDimer No results for input(s): DDIMER in the last 168 hours.   Radiology    ECHOCARDIOGRAM COMPLETE  Result Date: 06/10/2020    ECHOCARDIOGRAM REPORT   Patient Name:   Mike Frazier Date of Exam: 06/10/2020 Medical Rec #:  [id number]        Height:       68.0 in Accession #:  [id number]       Weight:       229.5 lb Date of Birth:  Mar 31, 1981         BSA:          2.167 m Patient Age:    39 years         BP:           132/78 mmHg Patient Gender: M                HR:           62 bpm. Exam Location:  Inpatient Procedure: 2D Echo Indications:    NSTEMI I21.4  History:        Patient has prior history of Echocardiogram examinations, most                 recent 09/30/2016. Previous Myocardial Infarction and CAD,                 Signs/Symptoms:Chest Pain; Risk Factors:Hypertension, Former                 Smoker and Dyslipidemia.  Sonographer:    Leavy Cella Referring Phys: [place]  1. Left ventricular ejection fraction, by estimation, is 55 to 60%. The left ventricle has normal function. The left ventricle has no regional wall motion abnormalities. There is mild left ventricular hypertrophy. Left ventricular diastolic parameters were  normal.  2. Right ventricular systolic function is normal. The right ventricular size is normal.  3. The mitral valve is normal in structure. No evidence of mitral valve regurgitation. No evidence of mitral stenosis.  4. The aortic valve is tricuspid. Aortic valve regurgitation is not visualized. No aortic stenosis is present.  5. The inferior vena cava is normal in size with greater than 50% respiratory variability, suggesting right atrial pressure of 3 mmHg. FINDINGS  Left Ventricle: Left ventricular ejection fraction, by estimation, is 55 to 60%. The left ventricle has normal function. The left ventricle has no regional wall motion abnormalities. The left ventricular internal cavity size was normal in size. There is  mild left ventricular hypertrophy. Left ventricular diastolic parameters were normal. Right Ventricle: The right ventricular size is normal. No increase in right ventricular wall thickness. Right ventricular systolic function is normal. Left Atrium: Left atrial size was normal in size. Right Atrium: Right atrial size was normal in size. Pericardium: There is no evidence of pericardial effusion. Mitral Valve: The mitral valve is normal in structure. Normal mobility of the mitral valve leaflets. No evidence of mitral valve regurgitation. No evidence of mitral valve stenosis. Tricuspid Valve: The tricuspid valve is normal in structure. Tricuspid valve regurgitation is not demonstrated. No evidence of tricuspid stenosis. Aortic Valve: The aortic valve is tricuspid. Aortic valve regurgitation is not visualized. No aortic stenosis is present. Pulmonic Valve: The pulmonic valve was normal in structure. Pulmonic valve regurgitation is not visualized. No evidence of pulmonic stenosis. Aorta: The aortic root is normal in size and structure. Venous: The inferior vena cava is normal in size with greater than 50% respiratory variability, suggesting right atrial pressure of 3 mmHg. IAS/Shunts: No atrial level  shunt detected by color flow Doppler.  LEFT VENTRICLE PLAX 2D LVIDd:         4.60 cm  Diastology LVIDs:         3.20 cm  LV e' lateral:   12.20 cm/s LV PW:         1.50 cm  LV E/e' lateral: 7.6 LV IVS:        1.20 cm  LV e' medial:    8.59 cm/s LVOT diam:     2.00 cm  LV E/e' medial:  10.8 LVOT Area:     3.14 cm  RIGHT VENTRICLE RV S prime:     12.20 cm/s TAPSE (M-mode): 2.1 cm LEFT ATRIUM             Index       RIGHT ATRIUM           Index LA diam:        3.50 cm 1.62 cm/m  RA Area:     20.80 cm LA Vol (A2C):   52.9 ml 24.41 ml/m RA Volume:   71.20 ml  32.86 ml/m LA Vol (A4C):   33.4 ml 15.41 ml/m LA Biplane Vol: 42.4 ml 19.57 ml/m   AORTA Ao Root diam: 3.00 cm MITRAL VALVE MV Area (PHT): 2.80 cm    SHUNTS MV Decel Time: 271 msec    Systemic Diam: 2.00 cm MV E velocity: 93.00 cm/s MV A velocity: 51.00 cm/s MV E/A ratio:  1.82 Jenkins Rouge MD Electronically signed by Jenkins Rouge MD Signature Date/Time: 06/10/2020/12:18:29 PM    Final    Telemetry    06/12/20 NSR  - Personally Reviewed  ECG    No new tracing as of 06/12/20 - Personally Reviewed  Cardiac Studies   Cardiac Cath 11/2018  Prox LAD lesion is 90% stenosed.  A drug-eluting stent was successfully placed.  Post intervention, there is a 0% residual stenosis.  The left ventricular systolic function is normal.  LV end diastolic pressure is normal.  The left ventricular ejection fraction is 55-65% by visual estimate. PHYSICIAN: Quay Burow, M.D.  PROCEDURE DESCRIPTION:  The patient was brought to the second floor  Cardiac cath lab in the postabsorptive state. He was premedicated with IV Versed and fentanyl. His right wrist was prepped and shaved in usual sterile fashion. Xylocaine 1% was used for local anesthesia. A 6 French sheath was inserted into the radial artery using standard Seldinger technique. The patient received 5000 units of heparin intravenously. A 5 [place] TIG catheter and pigtail catheters were  used for selective coronary angiography and left ventriculography respectively. Isovue dye was used for the entirety of the case. Retrograde aorta, left ventricular and pullback pressures were recorded. Radial cocktail was administered via the SideArm sheath.  The patient received Brilinta 180 mg p.o. followed by Angiomax bolus and infusion with a therapeutic ACT. Using a 6 [place] XB LAD 3.5 cm guide catheter along with a 0.14 pro-water guidewire a 2 mm x 12 mm balloon the proximal LAD in-stent restenosis was predilated. Following this a 2.75 mm x 24 mm long Synergy drug-eluting stent was then carefully positioned to include the previously placed stent and deployed at 16 atm. The entire resected segment was postdilated with a 3 mm x 15 mm long noncompliant balloon at 16 atm (3.1 mm) resulting reduction of a 90% "in-stent restenosis to 0% residual. The patient tolerated procedure well. The guidewire and catheter were removed. The sheath was removed and a TR band was placed on the right wrist to achieve patent hemostasis. The patient left the lab in stable condition.    IMPRESSION:Mr. Pannone had aggressive in-stent restenosis within the previously placed proximal LAD stent by Dr. Tamala Julian 2 years ago. He was restented with a synergy drug-eluting stent postdilated to 3.1 mm. Angiomax will continue for 4  hours full dose. The patient was noncompliant with his antiplatelet medications prior to admission. He will be discharged home in the morning. Medication compliance will be reinforced. He left the lab in stable condition.  Quay Burow. MD, [facility] 12/17/2018 11:44 AM  2D echo 09/2016 Study Conclusions  - Left ventricle: The cavity size was normal. Posterior wall thickness was increased in a pattern of mild LVH. Systolic function was vigorous. The estimated ejection fraction was in the range of 65% to 70%. Wall motion was normal; there were no regional wall motion  abnormalities. Left ventricular diastolic function parameters were normal. - Aortic valve: Transvalvular velocity was within the normal range. There was no stenosis. There was no regurgitation. - Mitral valve: Transvalvular velocity was within the normal range. There was no evidence for stenosis. There was no regurgitation. - Right ventricle: The cavity size was normal. Wall thickness was normal. Systolic function was normal. - Tricuspid valve: There was no regurgitation  Patient Profile     39 y.o. male with a hx of CAD (NSTEMI 2017 w/ DES to LAD, NSTEMI 11/2018 s/p DES to LAD), HTN, HLD, pre-diabetes,formertobaccouse, asthma, obesity, THC use, episodic noncompliancewho is being seen today for the evaluation ofchest pain/elevated troponinat the request of Dr. Regenia Skeeter.  Initially referred for hospitalist admission but upon H/P it seemed appropriate to admit patient to cardiology service.  Assessment & Plan    1. CAD with unstable angina: -NSTEMI 2017 w/ DES to LAD, NSTEMI 11/2018 s/p DES to LAD) -hsT 8>>31>>49>>48>>21 -Denies recurrent symptoms -Plan for LHC today  -Continue ASA, Brilinta, statin, BB  2. HTN: -Stable, 113/69>133/64>145/71 -Continue amlodipine, carvedilol  3. HLD: -Last LDL, 96 with a gaol of <70 -Continue high intensity statin   Signed, Kathyrn Drown NP-C [facility] Pager: [phone] 06/12/2020, 8:59 AM     Patient seen and examined. Agree with assessment and plan. Pt had cardiac cath and images were reviewed with Dr. Burt Knack in lab.  Diffuse in-stent restenosis with stenosis also at proximal and distal edges beyond the stent. Agree with surgical consultation for LIMA-LAD for improved long term benefit. Will need brilinta washout with last dose of brilinta given this morning. Dr. Servando Snare has seen patient with possible Friday date for surgery.    Troy Sine, MD, [facility] 06/12/2020 12:56 PM  For questions or updates, please contact     Please consult [email or web address] for contact info under Cardiology/STEMI.

## 2020-06-12 NOTE — Progress Notes (Signed)
ANTICOAGULATION CONSULT NOTE - Follow-Up Consult Note  Pharmacy Consult for heparin Indication: chest pain/ACS  Patient Measurements: Height: 5\' 8"  (172.7 cm) Weight: 101.3 kg (223 lb 4.8 oz) IBW/kg (Calculated) : 68.4  Ideal body weight: 68.4 kg (150 lb 12.7 oz) Adjusted ideal body weight: 81.6 kg (179 lb 12.7 oz)  Heparin Dosing Weight: 91.1 kg  Vital Signs: Temp: 98.2 F (36.8 C) (06/14 1421) Temp Source: Oral (06/14 1421) BP: 158/85 (06/14 1421) Pulse Rate: 61 (06/14 1421)  Labs: Recent Labs    06/09/20 1830 06/09/20 1830 06/10/20 0234 06/10/20 0234 06/10/20 0712 06/10/20 0936 06/11/20 0634 06/12/20 0756  HGB  --   --  14.7   < >  --   --  14.6 14.7  HCT  --   --  45.8  --   --   --  45.5 45.3  PLT  --   --  274  --   --   --  284 273  HEPARINUNFRC 0.21*   < > 0.47   < >  --  0.41 0.44 0.44  CREATININE  --   --  1.03  --   --   --   --   --   TROPONINIHS 48*  --   --   --  21*  --   --   --    < > = values in this interval not displayed.    Estimated Creatinine Clearance: 108.9 mL/min (by C-G formula based on SCr of 1.03 mg/dL).   Assessment: 46 YOM presenting with CP, hx of CAD s/p PCIx2.  Not on anticoagulation PTA, on DAPT.  CBC wnl. Pharmacy consulted to dose Heparin for anticoagulation.  Heparin level at goal this morning. Patient now s/p cath found to have severe single vessel CAD. CABG has been recommended and CVTS tentatively scheduled for Friday due to brilinta given this am.   Orders to resume heparin this evening.   Goal of Therapy:  Heparin level 0.3-0.7 units/ml Monitor platelets by anticoagulation protocol: Yes   Plan:  - restart Heparin at 1200 units/hr (12 ml/hr) - Will continue to monitor for any signs/symptoms of bleeding and will follow up with heparin levels daily   Thank you for allowing pharmacy to be a part of this patient's care.  [place] PharmD., BCPS Clinical Pharmacist 06/12/2020 2:45 PM

## 2020-06-13 LAB — BASIC METABOLIC PANEL
Anion gap: 8 (ref 5–15)
BUN: 9 mg/dL (ref 6–20)
CO2: 26 mmol/L (ref 22–32)
Calcium: 8.7 mg/dL — ABNORMAL LOW (ref 8.9–10.3)
Chloride: 103 mmol/L (ref 98–111)
Creatinine, Ser: 1.03 mg/dL (ref 0.61–1.24)
GFR calc Af Amer: >60 mL/min (ref >60)
GFR calc non Af Amer: >60 mL/min (ref >60)
Glucose, Bld: 121 mg/dL — ABNORMAL HIGH (ref 70–99)
Potassium: 3.9 mmol/L (ref 3.5–5.1)
Sodium: 137 mmol/L (ref 135–145)

## 2020-06-13 LAB — CBC
HCT: 46.4 % (ref 39.0–52.0)
Hemoglobin: 14.9 g/dL (ref 13.0–17.0)
MCH: 28.7 pg (ref 26.0–34.0)
MCHC: 32.1 g/dL (ref 30.0–36.0)
MCV: 89.2 fL (ref 80.0–100.0)
Platelets: 274 10*3/uL (ref 150–400)
RBC: 5.2 MIL/uL (ref 4.22–5.81)
RDW: 13.6 % (ref 11.5–15.5)
WBC: 8.3 10*3/uL (ref 4.0–10.5)
nRBC: 0 % (ref 0.0–0.2)

## 2020-06-13 LAB — HEPARIN LEVEL (UNFRACTIONATED): Heparin Unfractionated: 0.31 IU/mL (ref 0.30–0.70)

## 2020-06-13 NOTE — Progress Notes (Signed)
CARDIAC REHAB PHASE I   PRE:  Rate/Rhythm: 61 SR    BP: sitting 165/106    SaO2:   MODE:  Ambulation: 700 ft   POST:  Rate/Rhythm: 67 SR    BP: sitting 160/97     SaO2:   Tolerated well, no c/o CP or SOB. Thankful to walk. BP elevated however pt just had tearful conversation with his family. Discussed IS (2400 mL), sternal precautions, mobility post op and d/c planning. Pt receptive and is processing his situation. Gave materials to review and he can walk hall but to report sx.  [phone]   Harriet Masson CES, ACSM 06/13/2020 11:11 AM

## 2020-06-13 NOTE — Progress Notes (Signed)
ANTICOAGULATION CONSULT NOTE - Follow-Up Consult Note  Pharmacy Consult for heparin Indication: chest pain/ACS  Patient Measurements: Height: 5\' 8"  (172.7 cm) Weight: 101 kg (222 lb 9.6 oz) IBW/kg (Calculated) : 68.4  Ideal body weight: 68.4 kg (150 lb 12.7 oz) Adjusted ideal body weight: 81.4 kg (179 lb 8.3 oz)  Heparin Dosing Weight: 91.1 kg  Vital Signs: Temp: 98.3 F (36.8 C) (06/15 0518) Temp Source: Oral (06/15 0518) BP: 137/83 (06/15 0518) Pulse Rate: 62 (06/15 0518)  Labs: Recent Labs    06/11/20 0634 06/11/20 0634 06/12/20 0756 06/13/20 0427  HGB 14.6   < > 14.7 14.9  HCT 45.5  --  45.3 46.4  PLT 284  --  273 274  HEPARINUNFRC 0.44  --  0.44 0.31  CREATININE  --   --   --  1.03   < > = values in this interval not displayed.    Estimated Creatinine Clearance: 108.7 mL/min (by C-G formula based on SCr of 1.03 mg/dL).   Assessment: 23 YOM presenting with CP, hx of CAD s/p PCIx2.  Not on anticoagulation PTA, on DAPT.  CBC wnl. Pharmacy consulted to dose Heparin for anticoagulation.  Heparin level at goal this morning. Patient now s/p cath found to have severe single vessel CAD. CABG has been recommended and CVTS tentatively scheduled for Friday due to brilinta given morning of 6/14. No infusion or bleeding issues per RN.   Goal of Therapy:  Heparin level 0.3-0.7 units/ml Monitor platelets by anticoagulation protocol: Yes   Plan:  - Continue Heparin at 1200 units/hr (12 ml/hr) - Obtain daily CBC and Heparin levels  - Will continue to monitor for any signs/symptoms of bleeding   Thank you for allowing pharmacy to be a part of this patient's care.  7/14, PharmD-Candidate

## 2020-06-13 NOTE — Progress Notes (Signed)
[address]       [place] [address]             [phone]                 1 Day Post-Op Procedure(s) (LRB): LEFT HEART CATH AND CORONARY ANGIOGRAPHY (N/A)  LOS: 4 days   Subjective: No chest pain noted, awoke from sleep with chest pain at the time of admission  Objective: Vital signs in last 24 hours: Patient Vitals for the past 24 hrs:  BP Temp Temp src Pulse Resp SpO2 Weight  06/13/20 0518 137/83 98.3 F (36.8 C) Oral 62 17 97 % 101 kg  06/12/20 2330 (!) 120/57 98.1 F (36.7 C) Oral -- 16 97 % --  06/12/20 2108 (!) 149/86 98 F (36.7 C) Oral 61 17 -- --  06/12/20 1615 (!) 154/93 98.1 F (36.7 C) Oral 62 18 100 % --  06/12/20 1421 (!) 158/85 98.2 F (36.8 C) Oral 61 18 100 % --  06/12/20 1335 (!) 158/94 -- -- 60 -- 100 % --  06/12/20 1305 (!) 143/90 -- -- (!) 56 -- 98 % --  06/12/20 1250 (!) 152/88 -- -- 60 -- 99 % --  06/12/20 1235 (!) 154/85 -- -- (!) 57 -- 99 % --  06/12/20 1205 (!) 152/89 -- -- (!) 56 -- 99 % --  06/12/20 1155 (!) 164/90 -- -- (!) 58 -- 99 % --  06/12/20 1135 (!) 169/88 -- -- (!) 59 -- 99 % --  06/12/20 1120 (!) 156/91 -- -- (!) 56 -- 100 % --    Filed Weights   06/11/20 0800 06/12/20 0739 06/13/20 0518  Weight: 104.1 kg 101.3 kg 101 kg    Hemodynamic parameters for last 24 hours:    Intake/Output from previous day: 06/14 0701 - 06/15 0700 In: 987 [P.O.:840; I.V.:147] Out: 1265 [Urine:1265] Intake/Output this shift: Total I/O In: -  Out: 600 [Urine:600]  Scheduled Meds: . amLODipine  5 mg Oral Daily  . aspirin EC  81 mg Oral Daily  . atorvastatin  80 mg Oral QPM  . carvedilol  6.25 mg Oral BID WC  . Chlorhexidine Gluconate Cloth  6 each Topical Q0600  . lisinopril  20 mg Oral Daily  . mupirocin ointment  1 application Nasal BID  . sodium chloride flush  3 mL Intravenous Q12H  . sodium chloride flush  3 mL Intravenous Q12H  . sodium chloride flush  3 mL Intravenous Q12H   Continuous Infusions: . sodium  chloride    . sodium chloride    . heparin 1,200 Units/hr (06/12/20 1740)   PRN Meds:.sodium chloride, sodium chloride, acetaminophen, nitroGLYCERIN, ondansetron (ZOFRAN) IV, sodium chloride flush, sodium chloride flush  General appearance: alert and cooperative Neurologic: intact Heart: regular rate and rhythm, S1, S2 normal, no murmur, click, rub or gallop Lungs: clear to auscultation bilaterally Abdomen: soft, non-tender; bowel sounds normal; no masses,  no organomegaly Extremities: extremities normal, atraumatic, no cyanosis or edema and Homans sign is negative, no sign of DVT  Lab Results: CBC: Recent Labs    06/12/20 0756 06/13/20 0427  WBC 8.0 8.3  HGB 14.7 14.9  HCT 45.3 46.4  PLT 273 274   BMET:  Recent Labs    06/13/20 0427  NA 137  K 3.9  CL 103  CO2 26  GLUCOSE 121*  BUN 9  CREATININE 1.03  CALCIUM 8.7*    PT/INR: No results for  input(s): LABPROT, INR in the last 72 hours.   Radiology CARDIAC CATHETERIZATION  Result Date: 06/12/2020 1. Severe single vessel CAD with severe diffuse in-stent restenosis of the proximal LAD, including the proximal and distal stent margins 2. Mild nonobstructive RCA stenosis 3. Patent left main/left circumflex 4. Elevated LVEDP recorded at 27 mmHg but visually appears approximately 20 mmHg Recommend: TCTS consult for consideration of LIMA-LAD grafting. If patient declines CABG would offer PCI of the LAD, but I think long-term patency rates would be low and he would be at high-risk of recurrent LAD-territory ischemia.     Assessment/Plan: S/P Procedure(s) (LRB): LEFT HEART CATH AND CORONARY ANGIOGRAPHY (N/A) Mobilize  Plan CABG  Friday, patient agreeable    Delight Ovens MD 06/13/2020 11:13 AM

## 2020-06-13 NOTE — Progress Notes (Addendum)
Progress Note  Patient Name: Mike Frazier Date of Encounter: 06/13/2020  Primary Cardiologist: Belva Crome III, MD  Subjective   No chest pain, SOB. Anticipate CABG Friday. Some anxiety   Inpatient Medications    Scheduled Meds: . amLODipine  5 mg Oral Daily  . aspirin EC  81 mg Oral Daily  . atorvastatin  80 mg Oral QPM  . carvedilol  6.25 mg Oral BID WC  . Chlorhexidine Gluconate Cloth  6 each Topical Q0600  . lisinopril  20 mg Oral Daily  . mupirocin ointment  1 application Nasal BID  . sodium chloride flush  3 mL Intravenous Q12H  . sodium chloride flush  3 mL Intravenous Q12H  . sodium chloride flush  3 mL Intravenous Q12H   Continuous Infusions: . sodium chloride    . sodium chloride    . heparin 1,200 Units/hr (06/12/20 1740)   PRN Meds: sodium chloride, sodium chloride, acetaminophen, nitroGLYCERIN, ondansetron (ZOFRAN) IV, sodium chloride flush, sodium chloride flush   Vital Signs    Vitals:   06/12/20 1615 06/12/20 2108 06/12/20 2330 06/13/20 0518  BP: (!) 154/93 (!) 149/86 (!) 120/57 137/83  Pulse: 62 61  62  Resp: _0 Temp: 98.1 F (36.7 C) 98 F (36.7 C) 98.1 F (36.7 C) 98.3 F (36.8 C)  TempSrc: Oral Oral Oral Oral  SpO2: 100%  97% 97%  Weight:    101 kg  Height:        Intake/Output Summary (Last 24 hours) at 06/13/2020 0701 Last data filed at 06/13/2020 0517 Gross per 24 hour  Intake 966.4 ml  Output 1265 ml  Net -298.6 ml   Filed Weights   06/11/20 0800 06/12/20 0739 06/13/20 0518  Weight: 104.1 kg 101.3 kg 101 kg    Physical Exam   General: Well developed, well nourished, NAD Neck: Negative for carotid bruits. No JVD Lungs:Clear to ausculation bilaterally. No wheezes, rales, or rhonchi. Breathing is unlabored. Cardiovascular: RRR with S1 S2. No murmurs, Abdomen: Soft, non-tender, non-distended. No obvious abdominal masses. Extremities: No edema. Radial  pulses 2+ bilaterally Neuro: Alert and oriented. No  focal deficits. No facial asymmetry. MAE spontaneously. Psych: Responds to questions appropriately with normal affect.    Labs    Chemistry Recent Labs  Lab 06/09/20 0614 06/10/20 0234 06/13/20 0427  NA 139 140 137  K 3.2* 3.8 3.9  CL 102 105 103  CO2 _1 GLUCOSE 119* 102* 121*  BUN _2 CREATININE 1.02 1.03 1.03  CALCIUM 8.5* 8.8* 8.7*  GFRNONAA >60 >60 >60  GFRAA >60 >60 >60  ANIONGAP _3 Hematology Recent Labs  Lab 06/11/20 0634 06/12/20 0756 06/13/20 0427  WBC 8.6 8.0 8.3  RBC 5.08 5.09 5.20  HGB 14.6 14.7 14.9  HCT 45.5 45.3 46.4  MCV 89.6 89.0 89.2  MCH 28.7 28.9 28.7  MCHC 32.1 32.5 32.1  RDW 13.7 13.6 13.6  PLT 284 273 274    Cardiac EnzymesNo results for input(s): TROPONINI in the last 168 hours. No results for input(s): TROPIPOC in the last 168 hours.   BNPNo results for input(s): BNP, PROBNP in the last 168 hours.   DDimer No results for input(s): DDIMER in the last 168 hours.   Radiology    CARDIAC CATHETERIZATION  Result Date: 06/12/2020 1. Severe single vessel CAD with severe diffuse in-stent restenosis of the proximal LAD, including the proximal and distal  stent margins 2. Mild nonobstructive RCA stenosis 3. Patent left main/left circumflex 4. Elevated LVEDP recorded at 27 mmHg but visually appears approximately 20 mmHg Recommend: TCTS consult for consideration of LIMA-LAD grafting. If patient declines CABG would offer PCI of the LAD, but I think long-term patency rates would be low and he would be at high-risk of recurrent LAD-territory ischemia.   Telemetry    06/13/20 NSR  - Personally Reviewed  ECG    No new tracing as of 06/13/20 - Personally Reviewed  Cardiac Studies   Cardiac cath 06/12/20:  1. Severe single vessel CAD with severe diffuse in-stent restenosis of the proximal LAD, including the proximal and distal stent margins 2. Mild nonobstructive RCA stenosis 3. Patent left main/left circumflex 4. Elevated LVEDP  recorded at 27 mmHg but visually appears approximately 20 mmHg  Recommend: TCTS consult for consideration of LIMA-LAD grafting. If patient declines CABG would offer PCI of the LAD, but I think long-term patency rates would be low and he would be at high-risk of recurrent LAD-territory ischemia.    Cardiac Cath 11/2018  Prox LAD lesion is 90% stenosed.  A drug-eluting stent was successfully placed.  Post intervention, there is a 0% residual stenosis.  The left ventricular systolic function is normal.  LV end diastolic pressure is normal.  The left ventricular ejection fraction is 55-65% by visual estimate. PHYSICIAN: Quay Burow, M.D.  PROCEDURE DESCRIPTION:  The patient was brought to the second floor [facility] cath lab in the postabsorptive state. He was premedicated with IV Versed and fentanyl. His right wrist was prepped and shaved in usual sterile fashion. Xylocaine 1% was used for local anesthesia. A 6 French sheath was inserted into the radial artery using standard Seldinger technique. The patient received 5000 units of heparin intravenously. A 5 [place] TIG catheter and pigtail catheters were used for selective coronary angiography and left ventriculography respectively. Isovue dye was used for the entirety of the case. Retrograde aorta, left ventricular and pullback pressures were recorded. Radial cocktail was administered via the SideArm sheath.  The patient received Brilinta 180 mg p.o. followed by Angiomax bolus and infusion with a therapeutic ACT. Using a 6 [place] XB LAD 3.5 cm guide catheter along with a 0.14 pro-water guidewire a 2 mm x 12 mm balloon the proximal LAD in-stent restenosis was predilated. Following this a 2.75 mm x 24 mm long Synergy drug-eluting stent was then carefully positioned to include the previously placed stent and deployed at 16 atm. The entire resected segment was postdilated with a 3 mm x 15 mm long noncompliant balloon at 16  atm (3.1 mm) resulting reduction of a 90% "in-stent restenosis to 0% residual. The patient tolerated procedure well. The guidewire and catheter were removed. The sheath was removed and a TR band was placed on the right wrist to achieve patent hemostasis. The patient left the lab in stable condition.    IMPRESSION:Mr. Laspina had aggressive in-stent restenosis within the previously placed proximal LAD stent by Dr. Tamala Julian 2 years ago. He was restented with a synergy drug-eluting stent postdilated to 3.1 mm. Angiomax will continue for 4 hours full dose. The patient was noncompliant with his antiplatelet medications prior to admission. He will be discharged home in the morning. Medication compliance will be reinforced. He left the lab in stable condition.  Quay Burow. MD, [facility] 12/17/2018 11:44 AM  2D echo 09/2016 Study Conclusions  - Left ventricle: The cavity size was normal. Posterior wall thickness was increased in a  pattern of mild LVH. Systolic function was vigorous. The estimated ejection fraction was in the range of 65% to 70%. Wall motion was normal; there were no regional wall motion abnormalities. Left ventricular diastolic function parameters were normal. - Aortic valve: Transvalvular velocity was within the normal range. There was no stenosis. There was no regurgitation. - Mitral valve: Transvalvular velocity was within the normal range. There was no evidence for stenosis. There was no regurgitation. - Right ventricle: The cavity size was normal. Wall thickness was normal. Systolic function was normal. - Tricuspid valve: There was no regurgitation  Patient Profile     39 y.o. male with a hx of CAD (NSTEMI 2017 w/ DES to LAD, NSTEMI 11/2018 s/p DES to LAD), HTN, HLD, pre-diabetes,formertobaccouse, asthma, obesity, THC use, episodic noncompliancewho is being seen today for the evaluation ofchest pain/elevated troponinat the request of  Dr. Regenia Skeeter.  Initially referred for hospitalist admission but upon H/P it seemed appropriate to admit patient to cardiology service.  Assessment & Plan    1. CAD with unstable angina: -NSTEMI 2017 w/ DES to LAD, NSTEMI 11/2018 s/p DES to LAD) -hsT 8>>31>>49>>48>>21 -LHC 06/12/20 with severe single vessel CAD with severe diffuse in-stent restenosis of the proximal LAD, including the proximal and distal stent margins, mild nonobstructive RCA stenosis, patent left main/left circumflex -Recommendations were for TCTS consult for consideration of LIMA-LAD grafting. -Seen by Dr. Servando Snare with plans for single vessel CABG 06/16/20 after Brilinta washout -Denies recurrent symptoms  -Continue ASA, statin, BB  2. HTN: -Stable, 137/83>120/57>149/86 -Continue amlodipine, carvedilol  3. HLD: -Last LDL, 96 with a gaol of <70 -Continue high intensity statin    Signed, Kathyrn Drown NP-C [facility] Pager: [phone] 06/13/2020, 7:01 AM     Patient seen and examined. Agree with assessment and plan. No recurrent chest pain. Patient has decided to pursue CABG with LIMA to LAD. Currently on brilinta washout.No recurrent chest pain. R radial site stable. Sinus rhythm in the 60s.   Troy Sine, MD, [facility] 06/13/2020 11:39 AM      For questions or updates, please contact   Please consult [email or web address] for contact info under Cardiology/STEMI.

## 2020-06-14 ENCOUNTER — Inpatient Hospital Stay (HOSPITAL_COMMUNITY): Payer: 59

## 2020-06-14 DIAGNOSIS — E785 Hyperlipidemia, unspecified: Secondary | ICD-10-CM

## 2020-06-14 DIAGNOSIS — Z01810 Encounter for preprocedural cardiovascular examination: Secondary | ICD-10-CM

## 2020-06-14 LAB — PULMONARY FUNCTION TEST
FEF 25-75 Pre: 2.98 L/sec
FEF2575-%Pred-Pre: 84 %
FEV1-%Pred-Pre: 100 %
FEV1-Pre: 3.34 L
FEV1FVC-%Pred-Pre: 97 %
FEV6-%Pred-Pre: 105 %
FEV6-Pre: 4.2 L
FEV6FVC-%Pred-Pre: 102 %
FVC-%Pred-Pre: 103 %
FVC-Pre: 4.2 L
Pre FEV1/FVC ratio: 80 %
Pre FEV6/FVC Ratio: 100 %

## 2020-06-14 LAB — CBC
HCT: 47.3 % (ref 39.0–52.0)
Hemoglobin: 15.4 g/dL (ref 13.0–17.0)
MCH: 29.2 pg (ref 26.0–34.0)
MCHC: 32.6 g/dL (ref 30.0–36.0)
MCV: 89.8 fL (ref 80.0–100.0)
Platelets: 294 10*3/uL (ref 150–400)
RBC: 5.27 MIL/uL (ref 4.22–5.81)
RDW: 13.7 % (ref 11.5–15.5)
WBC: 8.6 10*3/uL (ref 4.0–10.5)
nRBC: 0 % (ref 0.0–0.2)

## 2020-06-14 LAB — PLATELET INHIBITION P2Y12: Platelet Function  P2Y12: 58 [PRU] — ABNORMAL LOW (ref 182–335)

## 2020-06-14 LAB — HEPARIN LEVEL (UNFRACTIONATED): Heparin Unfractionated: 0.41 IU/mL (ref 0.30–0.70)

## 2020-06-14 NOTE — Progress Notes (Addendum)
Progress Note  Patient Name: Mike Frazier Date of Encounter: 06/14/2020  Primary Cardiologist: Sinclair Grooms, MD  Subjective   Doing well this AM. Chest pain Plan for CABG on Friday pending repeat P2Y12 result.   Inpatient Medications    Scheduled Meds:  amLODipine  5 mg Oral Daily   aspirin EC  81 mg Oral Daily   atorvastatin  80 mg Oral QPM   carvedilol  6.25 mg Oral BID WC   Chlorhexidine Gluconate Cloth  6 each Topical Q0600   lisinopril  20 mg Oral Daily   sodium chloride flush  3 mL Intravenous Q12H   Continuous Infusions:  sodium chloride     heparin 1,200 Units/hr (06/13/20 1700)   PRN Meds: sodium chloride, acetaminophen, nitroGLYCERIN, ondansetron (ZOFRAN) IV, sodium chloride flush   Vital Signs    Vitals:   06/13/20 1600 06/13/20 1919 06/14/20 0446 06/14/20 0820  BP: 128/79 (!) 147/94 110/60   Pulse: 60 68 60 (!) 58  Resp: _0 Temp: 97.9 F (36.6 C) 97.9 F (36.6 C) 98 F (36.7 C) 97.9 F (36.6 C)  TempSrc: Oral Oral Oral Oral  SpO2: 98% 99% 99% 99%  Weight:   100.3 kg   Height:        Intake/Output Summary (Last 24 hours) at 06/14/2020 0928 Last data filed at 06/14/2020 4709 Gross per 24 hour  Intake 464.98 ml  Output 1350 ml  Net -885.02 ml   Filed Weights   06/12/20 0739 06/13/20 0518 06/14/20 0446  Weight: 101.3 kg 101 kg 100.3 kg    Physical Exam   General: Well developed, well nourished, NAD Neck: Negative for carotid bruits. No JVD Lungs:Clear to ausculation bilaterally. No wheezes, rales, or rhonchi. Breathing is unlabored. Cardiovascular: RRR with S1 S2. No murmurs, rubs, gallops, or LV heave appreciated. Abdomen: Soft, non-tender, non-distended. No obvious abdominal masses. Extremities: No edema. Radial pulses 2+ bilaterally Neuro: Alert and oriented. No focal deficits. No facial asymmetry. MAE spontaneously. Psych: Responds to questions appropriately with normal affect.    Labs     Chemistry Recent Labs  Lab 06/09/20 0614 06/10/20 0234 06/13/20 0427  NA 139 140 137  K 3.2* 3.8 3.9  CL 102 105 103  CO2 _1 GLUCOSE 119* 102* 121*  BUN _2 CREATININE 1.02 1.03 1.03  CALCIUM 8.5* 8.8* 8.7*  GFRNONAA >60 >60 >60  GFRAA >60 >60 >60  ANIONGAP _3 Hematology Recent Labs  Lab 06/12/20 0756 06/13/20 0427 06/14/20 0451  WBC 8.0 8.3 8.6  RBC 5.09 5.20 5.27  HGB 14.7 14.9 15.4  HCT 45.3 46.4 47.3  MCV 89.0 89.2 89.8  MCH 28.9 28.7 29.2  MCHC 32.5 32.1 32.6  RDW 13.6 13.6 13.7  PLT 273 274 294    Cardiac EnzymesNo results for input(s): TROPONINI in the last 168 hours. No results for input(s): TROPIPOC in the last 168 hours.   BNPNo results for input(s): BNP, PROBNP in the last 168 hours.   DDimer No results for input(s): DDIMER in the last 168 hours.   Radiology    No results found.  Telemetry      06/14/20 NSR- Personally Reviewed  ECG    No new tracing as of 06/14/20- Personally Reviewed  Cardiac Studies    Cardiac cath 06/12/20:  1. Severe single vessel CAD with severe diffuse in-stent restenosis of the proximal LAD, including the proximal and  distal stent margins 2. Mild nonobstructive RCA stenosis 3. Patent left main/left circumflex 4. Elevated LVEDP recorded at 27 mmHg but visually appears approximately 20 mmHg  Recommend: TCTS consult for consideration of LIMA-LAD grafting. If patient declines CABG would offer PCI of the LAD, but I think long-term patency rates would be low and he would be at high-risk of recurrent LAD-territory ischemia.    Cardiac Cath 11/2018  Prox LAD lesion is 90% stenosed.  A drug-eluting stent was successfully placed.  Post intervention, there is a 0% residual stenosis.  The left ventricular systolic function is normal.  LV end diastolic pressure is normal.  The left ventricular ejection fraction is 55-65% by visual estimate. PHYSICIAN: Quay Burow, M.D.  PROCEDURE  DESCRIPTION:  The patient was brought to the second floor [facility] cath lab in the postabsorptive state. He was premedicated with IV Versed and fentanyl. His right wrist was prepped and shaved in usual sterile fashion. Xylocaine 1% was used for local anesthesia. A 6 French sheath was inserted into the radial artery using standard Seldinger technique. The patient received 5000 units of heparin intravenously. A 5 [place] TIG catheter and pigtail catheters were used for selective coronary angiography and left ventriculography respectively. Isovue dye was used for the entirety of the case. Retrograde aorta, left ventricular and pullback pressures were recorded. Radial cocktail was administered via the SideArm sheath.  The patient received Brilinta 180 mg p.o. followed by Angiomax bolus and infusion with a therapeutic ACT. Using a 6 [place] XB LAD 3.5 cm guide catheter along with a 0.14 pro-water guidewire a 2 mm x 12 mm balloon the proximal LAD in-stent restenosis was predilated. Following this a 2.75 mm x 24 mm long Synergy drug-eluting stent was then carefully positioned to include the previously placed stent and deployed at 16 atm. The entire resected segment was postdilated with a 3 mm x 15 mm long noncompliant balloon at 16 atm (3.1 mm) resulting reduction of a 90% "in-stent restenosis to 0% residual. The patient tolerated procedure well. The guidewire and catheter were removed. The sheath was removed and a TR band was placed on the right wrist to achieve patent hemostasis. The patient left the lab in stable condition.   IMPRESSION:Mr. Limehouse had aggressive in-stent restenosis within the previously placed proximal LAD stent by Dr. Tamala Julian 2 years ago. He was restented with a synergy drug-eluting stent postdilated to 3.1 mm. Angiomax will continue for 4 hours full dose. The patient was noncompliant with his antiplatelet medications prior to admission. He will be discharged home  in the morning. Medication compliance will be reinforced. He left the lab in stable condition.  Quay Burow. MD, [facility] 12/17/2018 11:44 AM  2D echo 09/2016 Study Conclusions  - Left ventricle: The cavity size was normal. Posterior wall thickness was increased in a pattern of mild LVH. Systolic function was vigorous. The estimated ejection fraction was in the range of 65% to 70%. Wall motion was normal; there were no regional wall motion abnormalities. Left ventricular diastolic function parameters were normal. - Aortic valve: Transvalvular velocity was within the normal range. There was no stenosis. There was no regurgitation. - Mitral valve: Transvalvular velocity was within the normal range. There was no evidence for stenosis. There was no regurgitation. - Right ventricle: The cavity size was normal. Wall thickness was normal. Systolic function was normal. - Tricuspid valve: There was no regurgitation  Patient Profile     39 y.o. male with a hx of CAD (NSTEMI 2017  w/ DES to LAD, NSTEMI 11/2018 s/p DES to LAD), HTN, HLD, pre-diabetes,formertobaccouse, asthma, obesity, THC use, episodic noncompliancewho is being seen today for the evaluation ofchest pain/elevated troponinat the request of Dr. Regenia Skeeter.  Initially referred for hospitalist admission but upon H/P it seemed appropriate to admit patient to cardiology service.  Assessment & Plan    1. CAD with unstable angina: -NSTEMI 2017 w/ DES to LAD, NSTEMI 11/2018 s/p DES to LAD) -hsT 8>>31>>49>>48>>21 -LHC 06/12/20 with severe single vessel CAD with severe diffuse in-stent restenosis of the proximal LAD, including the proximal and distal stent margins, mild nonobstructive RCA stenosis, patent left main/left circumflex -Recommendations were for TCTS consult for consideration of LIMA-LAD grafting. -Seen by Dr. Servando Snare with plans for single vessel CABG 06/16/20 after Brilinta washout -Per Dr. Servando Snare,  P2Y12 this am is 60 with last dose Brilinta Monday morning>>plan to repeat tomorrow AM before deciding to proceeding on Friday  -Denies recurrent symptoms  -Continue ASA, statin, BB  2. HTN: -Stable, 110/60>147/94>128/79>137/83 -Continue amlodipine, carvedilol  3. HLD: -Last LDL, 96 with a gaol of <70 -Continue high intensity statin   Signed, Kathyrn Drown NP-C [facility] Pager: [phone] 06/14/2020, 9:28 AM     Patient seen and examined. Agree with assessment and plan. Just back from vascular imaging. Feels well, no recurrent chest pain. BP elevated at last reading, but was 110/60 earlier today.  Tentative plan for CABG on Friday if adequate brilinta washout. Continues to be on heparin.   Troy Sine, MD, [facility] 06/14/2020 10:33 AM     For questions or updates, please contact   Please consult [email or web address] for contact info under Cardiology/STEMI.

## 2020-06-14 NOTE — Progress Notes (Signed)
ANTICOAGULATION CONSULT NOTE - Follow-Up Consult Note  Pharmacy Consult for heparin Indication: chest pain/ACS  Patient Measurements: Height: 5\' 8"  (172.7 cm) Weight: 100.3 kg (221 lb 3.2 oz) IBW/kg (Calculated) : 68.4  Ideal body weight: 68.4 kg (150 lb 12.7 oz) Adjusted ideal body weight: 81.2 kg (178 lb 15.3 oz)  Heparin Dosing Weight: 91.1 kg  Vital Signs: Temp: 97.9 F (36.6 C) (06/16 0820) Temp Source: Oral (06/16 0820) BP: 168/93 (06/16 1041) Pulse Rate: 58 (06/16 0820)  Labs: Recent Labs    06/12/20 0756 06/12/20 0756 06/13/20 0427 06/14/20 0451  HGB 14.7   < > 14.9 15.4  HCT 45.3  --  46.4 47.3  PLT 273  --  274 294  HEPARINUNFRC 0.44  --  0.31 0.41  CREATININE  --   --  1.03  --    < > = values in this interval not displayed.    Estimated Creatinine Clearance: 108.4 mL/min (by C-G formula based on SCr of 1.03 mg/dL).   Assessment: 68 YOM presenting with CP, hx of CAD s/p PCIx2.  Not on anticoagulation PTA, on DAPT.  CBC wnl. Pharmacy consulted to dose Heparin for anticoagulation.  Heparin level at goal this morning. Patient now s/p cath found to have severe single vessel CAD. CABG has been recommended and CVTS tentatively scheduled for Friday due to brilinta given morning of 6/14. No infusion or bleeding issues per RN. P2y12 test resulted at 58.   Goal of Therapy:  Heparin level 0.3-0.7 units/ml Monitor platelets by anticoagulation protocol: Yes   Plan:  - Continue Heparin at 1200 units/hr (12 ml/hr) - Obtain daily CBC and Heparin levels  - Will continue to monitor for any signs/symptoms of bleeding   Thank you for allowing pharmacy to be a part of this patient's care.  7/14 PharmD., BCPS Clinical Pharmacist 06/14/2020 11:16 AM

## 2020-06-14 NOTE — Progress Notes (Signed)
      [address]       [name] [address]             [phone]      P2Y12 this am is 58- last dose Brilinta Monday morning will repeat in am before deciding to proceeding on Friday    Delight Ovens MD

## 2020-06-14 NOTE — Progress Notes (Signed)
Pre-CABG Dopplers completed. Refer to "CV Proc" under chart review to view preliminary results.  06/14/2020 10:40 AM Eula Fried., MHA, RVT, RDCS, RDMS

## 2020-06-15 ENCOUNTER — Inpatient Hospital Stay (HOSPITAL_COMMUNITY): Payer: 59

## 2020-06-15 ENCOUNTER — Encounter (HOSPITAL_COMMUNITY): Payer: Self-pay | Admitting: Internal Medicine

## 2020-06-15 LAB — ABO/RH: ABO/RH(D): B POS

## 2020-06-15 LAB — TYPE AND SCREEN
ABO/RH(D): B POS
Antibody Screen: NEGATIVE

## 2020-06-15 LAB — BLOOD GAS, ARTERIAL
Acid-Base Excess: 2.1 mmol/L — ABNORMAL HIGH (ref 0.0–2.0)
Bicarbonate: 26.7 mmol/L (ref 20.0–28.0)
Drawn by: 330991
FIO2: 21
O2 Saturation: 94.6 %
Patient temperature: 37
pCO2 arterial: 45.8 mmHg (ref 32.0–48.0)
pH, Arterial: 7.384 (ref 7.350–7.450)
pO2, Arterial: 74.7 mmHg — ABNORMAL LOW (ref 83.0–108.0)

## 2020-06-15 LAB — CBC
HCT: 48.9 % (ref 39.0–52.0)
Hemoglobin: 15.6 g/dL (ref 13.0–17.0)
MCH: 28.6 pg (ref 26.0–34.0)
MCHC: 31.9 g/dL (ref 30.0–36.0)
MCV: 89.6 fL (ref 80.0–100.0)
Platelets: 280 10*3/uL (ref 150–400)
RBC: 5.46 MIL/uL (ref 4.22–5.81)
RDW: 13.9 % (ref 11.5–15.5)
WBC: 9.3 10*3/uL (ref 4.0–10.5)
nRBC: 0 % (ref 0.0–0.2)

## 2020-06-15 LAB — PROTIME-INR
INR: 1 (ref 0.8–1.2)
Prothrombin Time: 12.9 seconds (ref 11.4–15.2)

## 2020-06-15 LAB — APTT: aPTT: 75 seconds — ABNORMAL HIGH (ref 24–36)

## 2020-06-15 LAB — HEPARIN LEVEL (UNFRACTIONATED): Heparin Unfractionated: 0.33 IU/mL (ref 0.30–0.70)

## 2020-06-15 LAB — PLATELET INHIBITION P2Y12: Platelet Function  P2Y12: 202 [PRU] (ref 182–335)

## 2020-06-15 LAB — SURGICAL PCR SCREEN
MRSA, PCR: POSITIVE — AB
Staphylococcus aureus: POSITIVE — AB

## 2020-06-15 MED ORDER — MUPIROCIN 2 % EX OINT
1.0000 "application " | TOPICAL_OINTMENT | Freq: Two times a day (BID) | CUTANEOUS | Status: DC
  Administered 2020-06-15: 1 via NASAL

## 2020-06-15 MED ORDER — CHLORHEXIDINE GLUCONATE CLOTH 2 % EX PADS
6.0000 | MEDICATED_PAD | Freq: Once | CUTANEOUS | Status: AC
  Administered 2020-06-15: 6 via TOPICAL

## 2020-06-15 MED ORDER — CHLORHEXIDINE GLUCONATE 0.12 % MT SOLN
15.0000 mL | Freq: Once | OROMUCOSAL | Status: AC
  Administered 2020-06-16: 15 mL via OROMUCOSAL
  Filled 2020-06-15: qty 15

## 2020-06-15 MED ORDER — TRANEXAMIC ACID (OHS) BOLUS VIA INFUSION
15.0000 mg/kg | INTRAVENOUS | Status: AC
  Administered 2020-06-16: 1509 mg via INTRAVENOUS
  Filled 2020-06-15: qty 1509

## 2020-06-15 MED ORDER — CHLORHEXIDINE GLUCONATE CLOTH 2 % EX PADS: 6.0000 | MEDICATED_PAD | Freq: Once | CUTANEOUS | Status: AC

## 2020-06-15 MED ORDER — NOREPINEPHRINE 4 MG/250ML-% IV SOLN
0.0000 ug/min | INTRAVENOUS | Status: AC
  Administered 2020-06-16: 2 ug/min via INTRAVENOUS
  Filled 2020-06-15: qty 250

## 2020-06-15 MED ORDER — CHLORHEXIDINE GLUCONATE CLOTH 2 % EX PADS: 6.0000 | MEDICATED_PAD | Freq: Every day | CUTANEOUS | Status: DC

## 2020-06-15 MED ORDER — TRANEXAMIC ACID 1000 MG/10ML IV SOLN
1.5000 mg/kg/h | INTRAVENOUS | Status: AC
  Administered 2020-06-16: 1.5 mg/kg/h via INTRAVENOUS
  Filled 2020-06-15 (×2): qty 25

## 2020-06-15 MED ORDER — METOPROLOL TARTRATE 12.5 MG HALF TABLET
12.5000 mg | ORAL_TABLET | Freq: Once | ORAL | Status: AC
  Administered 2020-06-16: 12.5 mg via ORAL
  Filled 2020-06-15: qty 1

## 2020-06-15 MED ORDER — EPINEPHRINE HCL 5 MG/250ML IV SOLN IN NS
0.0000 ug/min | INTRAVENOUS | Status: DC
  Filled 2020-06-15: qty 250

## 2020-06-15 MED ORDER — PLASMA-LYTE 148 IV SOLN
INTRAVENOUS | Status: DC
  Filled 2020-06-15: qty 2.5

## 2020-06-15 MED ORDER — DEXMEDETOMIDINE HCL IN NACL 400 MCG/100ML IV SOLN
0.1000 ug/kg/h | INTRAVENOUS | Status: AC
  Administered 2020-06-16: .5 ug/kg/h via INTRAVENOUS
  Filled 2020-06-15: qty 100

## 2020-06-15 MED ORDER — PHENYLEPHRINE HCL-NACL 20-0.9 MG/250ML-% IV SOLN
30.0000 ug/min | INTRAVENOUS | Status: AC
  Administered 2020-06-16: 30 ug/min via INTRAVENOUS
  Filled 2020-06-15: qty 250

## 2020-06-15 MED ORDER — NITROGLYCERIN IN D5W 200-5 MCG/ML-% IV SOLN
2.0000 ug/min | INTRAVENOUS | Status: DC
  Filled 2020-06-15: qty 250

## 2020-06-15 MED ORDER — SODIUM CHLORIDE 0.9 % IV SOLN
750.0000 mg | INTRAVENOUS | Status: DC
  Filled 2020-06-15: qty 750

## 2020-06-15 MED ORDER — MAGNESIUM SULFATE 50 % IJ SOLN
40.0000 meq | INTRAMUSCULAR | Status: DC
  Filled 2020-06-15: qty 9.85

## 2020-06-15 MED ORDER — TEMAZEPAM 15 MG PO CAPS
15.0000 mg | ORAL_CAPSULE | Freq: Once | ORAL | Status: AC | PRN
  Administered 2020-06-15: 15 mg via ORAL
  Filled 2020-06-15: qty 1

## 2020-06-15 MED ORDER — SODIUM CHLORIDE 0.9 % IV SOLN
1.5000 g | INTRAVENOUS | Status: AC
  Administered 2020-06-16: 1.5 g via INTRAVENOUS
  Filled 2020-06-15: qty 1.5

## 2020-06-15 MED ORDER — VANCOMYCIN HCL 1500 MG/300ML IV SOLN
1500.0000 mg | INTRAVENOUS | Status: AC
  Administered 2020-06-16: 1500 mg via INTRAVENOUS
  Filled 2020-06-15: qty 300

## 2020-06-15 MED ORDER — BISACODYL 5 MG PO TBEC
5.0000 mg | DELAYED_RELEASE_TABLET | Freq: Once | ORAL | Status: AC
  Administered 2020-06-15: 5 mg via ORAL
  Filled 2020-06-15: qty 1

## 2020-06-15 MED ORDER — INSULIN REGULAR(HUMAN) IN NACL 100-0.9 UT/100ML-% IV SOLN
INTRAVENOUS | Status: AC
  Administered 2020-06-16: 2 [IU]/h via INTRAVENOUS
  Filled 2020-06-15: qty 100

## 2020-06-15 MED ORDER — POTASSIUM CHLORIDE 2 MEQ/ML IV SOLN
80.0000 meq | INTRAVENOUS | Status: DC
  Filled 2020-06-15: qty 40

## 2020-06-15 MED ORDER — TRANEXAMIC ACID (OHS) PUMP PRIME SOLUTION
2.0000 mg/kg | INTRAVENOUS | Status: DC
  Filled 2020-06-15: qty 2.01

## 2020-06-15 MED ORDER — SODIUM CHLORIDE 0.9 % IV SOLN
INTRAVENOUS | Status: DC
  Filled 2020-06-15: qty 30

## 2020-06-15 MED ORDER — MILRINONE LACTATE IN DEXTROSE 20-5 MG/100ML-% IV SOLN
0.3000 ug/kg/min | INTRAVENOUS | Status: DC
  Filled 2020-06-15: qty 100

## 2020-06-15 NOTE — Progress Notes (Addendum)
Progress Note  Patient Name: Mike Frazier Date of Encounter: 06/15/2020  [facility] Cardiologist: Lesleigh Noe, MD   Subjective   Feeling well. No chest pain, sob or palpitations.   Inpatient Medications    Scheduled Meds: . amLODipine  5 mg Oral Daily  . aspirin EC  81 mg Oral Daily  . atorvastatin  80 mg Oral QPM  . carvedilol  6.25 mg Oral BID WC  . [START ON 06/16/2020] epinephrine  0-10 mcg/min Intravenous To OR  . [START ON 06/16/2020] heparin-papaverine-plasmalyte irrigation   Irrigation To OR  . [START ON 06/16/2020] insulin   Intravenous To OR  . lisinopril  20 mg Oral Daily  . [START ON 06/16/2020] magnesium sulfate  40 mEq Other To OR  . [START ON 06/16/2020] phenylephrine  30-200 mcg/min Intravenous To OR  . [START ON 06/16/2020] potassium chloride  80 mEq Other To OR  . sodium chloride flush  3 mL Intravenous Q12H  . [START ON 06/16/2020] tranexamic acid  15 mg/kg Intravenous To OR  . [START ON 06/16/2020] tranexamic acid  2 mg/kg Intracatheter To OR   Continuous Infusions: . sodium chloride    . [START ON 06/16/2020] cefUROXime (ZINACEF)  IV    . [START ON 06/16/2020] cefUROXime (ZINACEF)  IV    . [START ON 06/16/2020] dexmedetomidine    . [START ON 06/16/2020] heparin 30,000 units/NS 1000 mL solution for CELLSAVER    . heparin 1,200 Units/hr (06/15/20 0955)  . [START ON 06/16/2020] milrinone    . [START ON 06/16/2020] nitroGLYCERIN    . [START ON 06/16/2020] norepinephrine    . [START ON 06/16/2020] tranexamic acid (CYKLOKAPRON) infusion ([facility])    . [START ON 06/16/2020] vancomycin     PRN Meds: sodium chloride, acetaminophen, nitroGLYCERIN, ondansetron (ZOFRAN) IV, sodium chloride flush   Vital Signs    Vitals:   06/14/20 1443 06/14/20 2008 06/15/20 0348 06/15/20 0954  BP: (!) 149/78 (!) 147/71 123/83 (!) 144/84  Pulse: 62 63 60   Resp: 16 15 16    Temp: 98.2 F (36.8 C) 99 F (37.2 C) 98 F (36.7 C)   TempSrc: Oral Oral Oral   SpO2: 98% 98% 97%     Weight:   100.6 kg   Height:        Intake/Output Summary (Last 24 hours) at 06/15/2020 1003 Last data filed at 06/15/2020 0349 Gross per 24 hour  Intake 660 ml  Output 1700 ml  Net -1040 ml   Last 3 Weights 06/15/2020 06/14/2020 06/13/2020  Weight (lbs) 221 lb 12.8 oz 221 lb 3.2 oz 222 lb 9.6 oz  Weight (kg) 100.608 kg 100.336 kg 100.971 kg      Telemetry    NSR - Personally Reviewed  ECG    N/A  Physical Exam   GEN: No acute distress.   Neck: No JVD Cardiac: RRR, no murmurs, rubs, or gallops.  Respiratory: Clear to auscultation bilaterally. GI: Soft, nontender, non-distended  MS: No edema; No deformity. Neuro:  Nonfocal  Psych: Normal affect   Labs    High Sensitivity Troponin:   Recent Labs  Lab 06/09/20 0614 06/09/20 0808 06/09/20 1147 06/09/20 1830 06/10/20 0712  TROPONINIHS 8 31* 49* 48* 21*      Chemistry Recent Labs  Lab 06/09/20 0614 06/10/20 0234 06/13/20 0427  NA 139 140 137  K 3.2* 3.8 3.9  CL 102 105 103  CO2 27 24 26   GLUCOSE 119* 102* 121*  BUN 8 10 9  CREATININE 1.02 1.03 1.03  CALCIUM 8.5* 8.8* 8.7*  GFRNONAA >60 >60 >60  GFRAA >60 >60 >60  ANIONGAP 10 11 8      Hematology Recent Labs  Lab 06/13/20 0427 06/14/20 0451 06/15/20 0350  WBC 8.3 8.6 9.3  RBC 5.20 5.27 5.46  HGB 14.9 15.4 15.6  HCT 46.4 47.3 48.9  MCV 89.2 89.8 89.6  MCH 28.7 29.2 28.6  MCHC 32.1 32.6 31.9  RDW 13.6 13.7 13.9  PLT 274 294 280     Radiology    VAS 06/17/20 DOPPLER PRE CABG  Result Date: 06/14/2020 PREOPERATIVE VASCULAR EVALUATION  Indications:      Pre-CABG. Risk Factors:     Hypertension, hyperlipidemia, past history of smoking. Comparison Study: No prior study Performing Technologist: 06/16/2020 MHA, RVT, RDCS, RDMS  Examination Guidelines: A complete evaluation includes B-mode imaging, spectral Doppler, color Doppler, and power Doppler as needed of all accessible portions of each vessel. Bilateral testing is considered an integral  part of a complete examination. Limited examinations for reoccurring indications may be performed as noted.  Right Carotid Findings: +----------+--------+--------+--------+-----------------------+--------+           PSV cm/sEDV cm/sStenosisDescribe               Comments +----------+--------+--------+--------+-----------------------+--------+ CCA Prox  138     24                                              +----------+--------+--------+--------+-----------------------+--------+ CCA Distal81      13                                              +----------+--------+--------+--------+-----------------------+--------+ ICA Prox  60      17                                              +----------+--------+--------+--------+-----------------------+--------+ ICA Distal76      28                                              +----------+--------+--------+--------+-----------------------+--------+ ECA       109     24              smooth and heterogenous         +----------+--------+--------+--------+-----------------------+--------+ Portions of this table do not appear on this page. +----------+--------+-------+----------------+------------+           PSV cm/sEDV cmsDescribe        Arm Pressure +----------+--------+-------+----------------+------------+ Subclavian137            Multiphasic, WNL             +----------+--------+-------+----------------+------------+ +---------+--------+--+--------+--+---------+ VertebralPSV cm/s54EDV cm/s15Antegrade +---------+--------+--+--------+--+---------+ Left Carotid Findings: +----------+--------+--------+--------+--------------------------+--------+           PSV cm/sEDV cm/sStenosisDescribe                  Comments +----------+--------+--------+--------+--------------------------+--------+ CCA Prox  140     24                                                  +----------+--------+--------+--------+--------------------------+--------+  CCA Distal91      27              smooth and heterogenous            +----------+--------+--------+--------+--------------------------+--------+ ICA Prox  49      17              heterogenous and irregular         +----------+--------+--------+--------+--------------------------+--------+ ICA Distal136     31                                                 +----------+--------+--------+--------+--------------------------+--------+ ECA       155     27              smooth and heterogenous            +----------+--------+--------+--------+--------------------------+--------+ +----------+--------+--------+----------------+------------+ SubclavianPSV cm/sEDV cm/sDescribe        Arm Pressure +----------+--------+--------+----------------+------------+           136             Multiphasic, WNL             +----------+--------+--------+----------------+------------+ +---------+--------+--+--------+--+---------+ VertebralPSV cm/s73EDV cm/s22Antegrade +---------+--------+--+--------+--+---------+  ABI Findings: +--------+------------------+-----+---------+--------+ Right   Rt Pressure (mmHg)IndexWaveform Comment  +--------+------------------+-----+---------+--------+ [id number]                    triphasic         +--------+------------------+-----+---------+--------+ PTA                            triphasic         +--------+------------------+-----+---------+--------+ DP                             triphasic         +--------+------------------+-----+---------+--------+ +--------+------------------+-----+---------+-------+ Left    Lt Pressure (mmHg)IndexWaveform Comment +--------+------------------+-----+---------+-------+ [id number]                    triphasic        +--------+------------------+-----+---------+-------+ PTA                             triphasic        +--------+------------------+-----+---------+-------+ DP                             triphasic        +--------+------------------+-----+---------+-------+  Right Doppler Findings: +-----------+--------+-----+---------+-----------------------------------------+ Site       PressureIndexDoppler  Comments                                  +-----------+--------+-----+---------+-----------------------------------------+ Brachial   139          triphasic                                          +-----------+--------+-----+---------+-----------------------------------------+ Radial                  triphasic                                          +-----------+--------+-----+---------+-----------------------------------------+  Palmar Arch                      Signal obliterates with radial                                             compression, is unaffected with ulnar                                      compression                               +-----------+--------+-----+---------+-----------------------------------------+  Left Doppler Findings: +-----------+--------+-----+---------+-----------------------------------------+ Site       PressureIndexDoppler  Comments                                  +-----------+--------+-----+---------+-----------------------------------------+ Brachial   146          triphasic                                          +-----------+--------+-----+---------+-----------------------------------------+ Radial                  triphasic                                          +-----------+--------+-----+---------+-----------------------------------------+ Ulnar                   triphasic                                          +-----------+--------+-----+---------+-----------------------------------------+ Palmar Arch                      Signal is unaffected with radial                                            compression, obliterates with ulnar                                        compression                               +-----------+--------+-----+---------+-----------------------------------------+  Summary: Right Carotid: Velocities in the right ICA are consistent with a 1-39% stenosis. Left Carotid: Velocities in the left ICA are consistent with a 1-39% stenosis. Vertebrals:  Bilateral vertebral arteries demonstrate antegrade flow. Subclavians: Normal flow hemodynamics were seen in bilateral subclavian              arteries. Bilateral pedal waveforms: Bilateral pedal waveforms are within normal lmits at rest. No evidence of significant lower extremity arterial disease. Right Upper  Extremity: Doppler waveform obliterate with right radial compression. Doppler waveforms remain within normal limits with right ulnar compression. Left Upper Extremity: Doppler waveforms remain within normal limits with left radial compression. Doppler waveform obliterate with left ulnar compression.   Electronically signed by Coral Else MD on 06/14/2020 at 11:22:54 PM.    Final     Cardiac Studies   Echo 06/10/20 1. Left ventricular ejection fraction, by estimation, is 55 to 60%. The  left ventricle has normal function. The left ventricle has no regional  wall motion abnormalities. There is mild left ventricular hypertrophy.  Left ventricular diastolic parameters  were normal.  2. Right ventricular systolic function is normal. The right ventricular  size is normal.  3. The mitral valve is normal in structure. No evidence of mitral valve  regurgitation. No evidence of mitral stenosis.  4. The aortic valve is tricuspid. Aortic valve regurgitation is not  visualized. No aortic stenosis is present.  5. The inferior vena cava is normal in size with greater than 50%  respiratory variability, suggesting right atrial pressure of 3 mmHg.   LEFT HEART CATH AND CORONARY ANGIOGRAPHY 06/12/20    Conclusion  1. Severe single vessel CAD with severe diffuse in-stent restenosis of the proximal LAD, including the proximal and distal stent margins 2. Mild nonobstructive RCA stenosis 3. Patent left main/left circumflex 4. Elevated LVEDP recorded at 27 mmHg but visually appears approximately 20 mmHg  Recommend: TCTS consult for consideration of LIMA-LAD grafting. If patient declines CABG would offer PCI of the LAD, but I think long-term patency rates would be low and he would be at high-risk of recurrent LAD-territory ischemia.  Diagnostic Dominance: Right    Patient Profile     39 y.o. male with a hx of CAD (NSTEMI 2017 w/ DES to LAD, NSTEMI 11/2018 s/p DES to LAD), HTN, HLD, pre-diabetes,formertobaccouse, asthma, obesity, THC use, episodic noncompliance presented to CP and found to have elevated troponin .   Assessment & Plan    1. Unstable angina/Elevated troponin/CAD - hsT 8>>31>>49>>48>>21 - LHC 06/12/20 with severe single vessel CAD with severe diffuse in-stent restenosis of the proximal LAD, including the proximal and distal stent margins, mild nonobstructive RCA stenosis, patent left main/left circumflex - CABG Tomorrow after Brilinta washout with LIMA to LAD - P2Y12 come back normal today - Continue ASA, statin and BB  2. HTN - BP stable on coreg and amlodipine  3. HLD - 01/20/2020: Cholesterol, Total 137; HDL 49; LDL Chol Calc (NIH) 69; Triglycerides 105  - Continue high intensity statin   For questions or updates, please contact [facility] Please consult [email or web address] for contact info under     SignedManson Passey, PA  06/15/2020, 10:03 AM    Patient seen and examined. Agree with assessment and plan.  No recurrent chest pain.  Continues to be on IV heparin.  P2Y12 level today increased at 202.  Should be okay for CABG surgery tomorrow with Dr. Remer Macho, MD, [facility] 06/15/2020 11:21 AM

## 2020-06-15 NOTE — Anesthesia Preprocedure Evaluation (Addendum)
Anesthesia Evaluation  Patient identified by MRN, date of birth, ID band Patient awake    Reviewed: Allergy & Precautions, NPO status , Patient's Chart, lab work & pertinent test results, reviewed documented beta blocker date and time   Airway Mallampati: II  TM Distance: >3 FB Neck ROM: Full    Dental  (+) Teeth Intact, Dental Advisory Given   Pulmonary asthma , former smoker,    Pulmonary exam normal breath sounds clear to auscultation       Cardiovascular hypertension, Pt. on home beta blockers and Pt. on medications + angina + CAD, + Past MI and + Cardiac Stents (LAD)  Normal cardiovascular exam Rhythm:Regular Rate:Normal  Echo 06/10/20: 1. Left ventricular ejection fraction, by estimation, is 55 to 60%. The  left ventricle has normal function. The left ventricle has no regional  wall motion abnormalities. There is mild left ventricular hypertrophy.  Left ventricular diastolic parameters  were normal.  2. Right ventricular systolic function is normal. The right ventricular  size is normal.  3. The mitral valve is normal in structure. No evidence of mitral valve  regurgitation. No evidence of mitral stenosis.  4. The aortic valve is tricuspid. Aortic valve regurgitation is not  visualized. No aortic stenosis is present.  5. The inferior vena cava is normal in size with greater than 50%  respiratory variability, suggesting right atrial pressure of 3 mmHg.    Neuro/Psych  Headaches,    GI/Hepatic negative GI ROS, Neg liver ROS,   Endo/Other  Obesity   Renal/GU negative Renal ROS     Musculoskeletal negative musculoskeletal ROS (+)   Abdominal   Peds  Hematology negative hematology ROS (+)   Anesthesia Other Findings   Reproductive/Obstetrics                            Anesthesia Physical Anesthesia Plan  ASA: IV  Anesthesia Plan: General   Post-op Pain Management:     Induction: Intravenous  PONV Risk Score and Plan: 2 and Midazolam and Treatment may vary due to age or medical condition  Airway Management Planned: Oral ETT  Additional Equipment: Arterial line, CVP, TEE and Ultrasound Guidance Line Placement  Intra-op Plan:   Post-operative Plan: Post-operative intubation/ventilation  Informed Consent: I have reviewed the patients History and Physical, chart, labs and discussed the procedure including the risks, benefits and alternatives for the proposed anesthesia with the patient or authorized representative who has indicated his/her understanding and acceptance.     Dental advisory given  Plan Discussed with: CRNA  Anesthesia Plan Comments: (FlowTrac)       Anesthesia Quick Evaluation

## 2020-06-15 NOTE — Progress Notes (Signed)
ANTICOAGULATION CONSULT NOTE - Follow-Up Consult Note  Pharmacy Consult for heparin Indication: chest pain/ACS  Patient Measurements: Height: 5\' 8"  (172.7 cm) Weight: 100.6 kg (221 lb 12.8 oz) IBW/kg (Calculated) : 68.4  Ideal body weight: 68.4 kg (150 lb 12.7 oz) Adjusted ideal body weight: 81.3 kg (179 lb 3.2 oz)  Heparin Dosing Weight: 91.1 kg  Vital Signs: Temp: 98 F (36.7 C) (06/17 0348) Temp Source: Oral (06/17 0348) BP: 144/84 (06/17 0954) Pulse Rate: 60 (06/17 0348)  Labs: Recent Labs    06/13/20 0427 06/13/20 0427 06/14/20 0451 06/15/20 0350  HGB 14.9   < > 15.4 15.6  HCT 46.4  --  47.3 48.9  PLT 274  --  294 280  HEPARINUNFRC 0.31  --  0.41 0.33  CREATININE 1.03  --   --   --    < > = values in this interval not displayed.    Estimated Creatinine Clearance: 108.5 mL/min (by C-G formula based on SCr of 1.03 mg/dL).   Assessment: 17 YOM presenting with CP, hx of CAD s/p PCIx2.  Not on anticoagulation PTA, on DAPT.  CBC wnl. Pharmacy consulted to dose Heparin for anticoagulation.  Heparin level at goal this morning. Patient now s/p cath found to have severe single vessel CAD. CABG has been recommended and CVTS scheduled for Friday due to brilinta given morning of 6/14. No infusion or bleeding issues. P2y12 test resulted at 202.   Goal of Therapy:  Heparin level 0.3-0.7 units/ml Monitor platelets by anticoagulation protocol: Yes   Plan:  - Continue Heparin at 1200 units/hr (12 ml/hr) - Obtain daily CBC and Heparin levels  - Will continue to monitor for any signs/symptoms of bleeding   Thank you for allowing pharmacy to be a part of this patient's care.  7/14, PharmD-Candidate

## 2020-06-15 NOTE — Progress Notes (Signed)
CARDIAC REHAB PHASE I   PRE:  Rate/Rhythm: 78 SR    BP: sitting 155/105    SaO2:   MODE:  Ambulation: 1200 ft   POST:  Rate/Rhythm: 78 SR    BP: sitting 154/91     SaO2:   Tolerated well. No c/o. Feels well. BP elevated. [id number]   Mike Frazier CES, ACSM 06/15/2020 3:28 PM

## 2020-06-15 NOTE — Progress Notes (Signed)
[address]       [place] [address]             [phone]                 3 Days Post-Op Procedure(s) (LRB): LEFT HEART CATH AND CORONARY ANGIOGRAPHY (N/A)  LOS: 6 days   Subjective: No chest pain today , p2y12 back over 200 today   Objective: Vital signs in last 24 hours: Patient Vitals for the past 24 hrs:  BP Temp Temp src Pulse Resp SpO2 Weight  06/15/20 1522 -- 98.3 F (36.8 C) Oral 75 19 98 % --  06/15/20 0954 (!) 144/84 -- -- -- -- -- --  06/15/20 0348 123/83 98 F (36.7 C) Oral 60 16 97 % 100.6 kg  06/14/20 2008 (!) 147/71 99 F (37.2 C) Oral 63 15 98 % --    Filed Weights   06/13/20 0518 06/14/20 0446 06/15/20 0348  Weight: 101 kg 100.3 kg 100.6 kg    Hemodynamic parameters for last 24 hours:    Intake/Output from previous day: 06/16 0701 - 06/17 0700 In: 1020 [P.O.:1020] Out: 1700 [Urine:1700] Intake/Output this shift: Total I/O In: 180 [P.O.:180] Out: -   Scheduled Meds: . amLODipine  5 mg Oral Daily  . aspirin EC  81 mg Oral Daily  . atorvastatin  80 mg Oral QPM  . carvedilol  6.25 mg Oral BID WC  . [START ON 06/16/2020] epinephrine  0-10 mcg/min Intravenous To OR  . [START ON 06/16/2020] heparin-papaverine-plasmalyte irrigation   Irrigation To OR  . [START ON 06/16/2020] insulin   Intravenous To OR  . lisinopril  20 mg Oral Daily  . [START ON 06/16/2020] magnesium sulfate  40 mEq Other To OR  . [START ON 06/16/2020] phenylephrine  30-200 mcg/min Intravenous To OR  . [START ON 06/16/2020] potassium chloride  80 mEq Other To OR  . sodium chloride flush  3 mL Intravenous Q12H  . [START ON 06/16/2020] tranexamic acid  15 mg/kg Intravenous To OR  . [START ON 06/16/2020] tranexamic acid  2 mg/kg Intracatheter To OR   Continuous Infusions: . sodium chloride    . [START ON 06/16/2020] cefUROXime (ZINACEF)  IV    . [START ON 06/16/2020] cefUROXime (ZINACEF)  IV    . [START ON 06/16/2020] dexmedetomidine    . [START ON 06/16/2020]  heparin 30,000 units/NS 1000 mL solution for CELLSAVER    . heparin 1,200 Units/hr (06/15/20 0955)  . [START ON 06/16/2020] milrinone    . [START ON 06/16/2020] nitroGLYCERIN    . [START ON 06/16/2020] norepinephrine    . [START ON 06/16/2020] tranexamic acid (CYKLOKAPRON) infusion ([facility])    . [START ON 06/16/2020] vancomycin     PRN Meds:.sodium chloride, acetaminophen, nitroGLYCERIN, ondansetron (ZOFRAN) IV, sodium chloride flush  General appearance: alert, cooperative and no distress Neurologic: intact Heart: regular rate and rhythm, S1, S2 normal, no murmur, click, rub or gallop Lungs: clear to auscultation bilaterally Abdomen: soft, non-tender; bowel sounds normal; no masses,  no organomegaly Extremities: extremities normal, atraumatic, no cyanosis or edema and Homans sign is negative, no sign of DVT  Lab Results: CBC: Recent Labs    06/14/20 0451 06/15/20 0350  WBC 8.6 9.3  HGB 15.4 15.6  HCT 47.3 48.9  PLT 294 280   BMET:  Recent Labs    06/13/20 0427  NA 137  K 3.9  CL 103  CO2 26  GLUCOSE 121*  BUN  9  CREATININE 1.03  CALCIUM 8.7*    PT/INR: No results for input(s): LABPROT, INR in the last 72 hours.   Radiology VAS US DOPPLER PRE CABG  Result Date: 06/14/2020 PREOPERATIVE VASCULAR EVALUATION  Indications:      Pre-CABG. Risk Factors:     Hypertension, hyperlipidemia, past history of smoking. Comparison Study: No prior study Performing Technologist: Gertie Fey MHA, RVT, RDCS, RDMS  Examination Guidelines: A complete evaluation includes B-mode imaging, spectral Doppler, color Doppler, and power Doppler as needed of all accessible portions of each vessel. Bilateral testing is considered an integral part of a complete examination. Limited examinations for reoccurring indications may be performed as noted.  Right Carotid Findings: +----------+--------+--------+--------+-----------------------+--------+           PSV cm/sEDV cm/sStenosisDescribe                Comments +----------+--------+--------+--------+-----------------------+--------+ CCA Prox  138     24                                              +----------+--------+--------+--------+-----------------------+--------+ CCA Distal81      13                                              +----------+--------+--------+--------+-----------------------+--------+ ICA Prox  60      17                                              +----------+--------+--------+--------+-----------------------+--------+ ICA Distal76      28                                              +----------+--------+--------+--------+-----------------------+--------+ ECA       109     24              smooth and heterogenous         +----------+--------+--------+--------+-----------------------+--------+ Portions of this table do not appear on this page. +----------+--------+-------+----------------+------------+           PSV cm/sEDV cmsDescribe        Arm Pressure +----------+--------+-------+----------------+------------+ Subclavian137            Multiphasic, WNL             +----------+--------+-------+----------------+------------+ +---------+--------+--+--------+--+---------+ VertebralPSV cm/s54EDV cm/s15Antegrade +---------+--------+--+--------+--+---------+ Left Carotid Findings: +----------+--------+--------+--------+--------------------------+--------+           PSV cm/sEDV cm/sStenosisDescribe                  Comments +----------+--------+--------+--------+--------------------------+--------+ CCA Prox  140     24                                                 +----------+--------+--------+--------+--------------------------+--------+ CCA Distal91      27              smooth and heterogenous            +----------+--------+--------+--------+--------------------------+--------+  ICA Prox  49      17              heterogenous and irregular          +----------+--------+--------+--------+--------------------------+--------+ ICA Distal136     31                                                 +----------+--------+--------+--------+--------------------------+--------+ ECA       155     27              smooth and heterogenous            +----------+--------+--------+--------+--------------------------+--------+ +----------+--------+--------+----------------+------------+ SubclavianPSV cm/sEDV cm/sDescribe        Arm Pressure +----------+--------+--------+----------------+------------+           136             Multiphasic, WNL             +----------+--------+--------+----------------+------------+ +---------+--------+--+--------+--+---------+ VertebralPSV cm/s73EDV cm/s22Antegrade +---------+--------+--+--------+--+---------+  ABI Findings: +--------+------------------+-----+---------+--------+ Right   Rt Pressure (mmHg)IndexWaveform Comment  +--------+------------------+-----+---------+--------+ [id number]                    triphasic         +--------+------------------+-----+---------+--------+ PTA                            triphasic         +--------+------------------+-----+---------+--------+ DP                             triphasic         +--------+------------------+-----+---------+--------+ +--------+------------------+-----+---------+-------+ Left    Lt Pressure (mmHg)IndexWaveform Comment +--------+------------------+-----+---------+-------+ [id number]                    triphasic        +--------+------------------+-----+---------+-------+ PTA                            triphasic        +--------+------------------+-----+---------+-------+ DP                             triphasic        +--------+------------------+-----+---------+-------+  Right Doppler Findings: +-----------+--------+-----+---------+-----------------------------------------+ Site        PressureIndexDoppler  Comments                                  +-----------+--------+-----+---------+-----------------------------------------+ Brachial   139          triphasic                                          +-----------+--------+-----+---------+-----------------------------------------+ Radial                  triphasic                                          +-----------+--------+-----+---------+-----------------------------------------+ Palmar Arch  Signal obliterates with radial                                             compression, is unaffected with ulnar                                      compression                               +-----------+--------+-----+---------+-----------------------------------------+  Left Doppler Findings: +-----------+--------+-----+---------+-----------------------------------------+ Site       PressureIndexDoppler  Comments                                  +-----------+--------+-----+---------+-----------------------------------------+ Brachial   146          triphasic                                          +-----------+--------+-----+---------+-----------------------------------------+ Radial                  triphasic                                          +-----------+--------+-----+---------+-----------------------------------------+ Ulnar                   triphasic                                          +-----------+--------+-----+---------+-----------------------------------------+ Palmar Arch                      Signal is unaffected with radial                                           compression, obliterates with ulnar                                        compression                               +-----------+--------+-----+---------+-----------------------------------------+  Summary: Right Carotid: Velocities in the right ICA are consistent with a  1-39% stenosis. Left Carotid: Velocities in the left ICA are consistent with a 1-39% stenosis. Vertebrals:  Bilateral vertebral arteries demonstrate antegrade flow. Subclavians: Normal flow hemodynamics were seen in bilateral subclavian              arteries. Bilateral pedal waveforms: Bilateral pedal waveforms are within normal lmits at rest. No evidence of significant lower extremity arterial disease. Right Upper Extremity: Doppler waveform obliterate with right radial compression. Doppler waveforms remain within normal limits with right ulnar compression. Left Upper Extremity: Doppler waveforms  remain within normal limits with left radial compression. Doppler waveform obliterate with left ulnar compression.   Electronically signed by Harold Barban MD on 06/14/2020 at 11:22:54 PM.    Final      Assessment/Plan: S/P Procedure(s) (LRB): LEFT HEART CATH AND CORONARY ANGIOGRAPHY (N/A) Plan CABG tomorrow.  I have again discussed plan with patient and today family present. The goals risks and alternatives of the planned surgical procedure CABG, Poss off pump   have been discussed with the patient in detail. The risks of the procedure including death, infection, stroke, myocardial infarction, bleeding, blood transfusion have all been discussed specifically.  I have quoted Mike Frazier a 1 % of perioperative mortality and a complication rate as high as 30  %. The patient's questions have been answered.Mike Frazier is willing  to proceed with the planned procedure.  Grace Isaac MD 06/15/2020 6:12 PM

## 2020-06-16 ENCOUNTER — Inpatient Hospital Stay (HOSPITAL_COMMUNITY): Payer: 59

## 2020-06-16 ENCOUNTER — Encounter (HOSPITAL_COMMUNITY)
Admission: AD | Disposition: A | Discharge status: Home / Self Care | Payer: Self-pay | Source: Home / Self Care | Attending: Cardiothoracic Surgery

## 2020-06-16 DIAGNOSIS — Z951 Presence of aortocoronary bypass graft: Secondary | ICD-10-CM

## 2020-06-16 HISTORY — PX: CORONARY ARTERY BYPASS GRAFT: SHX141

## 2020-06-16 HISTORY — PX: TEE WITHOUT CARDIOVERSION: SHX5443

## 2020-06-16 LAB — POCT I-STAT 7, (LYTES, BLD GAS, ICA,H+H)
Acid-Base Excess: 0 mmol/L (ref 0.0–2.0)
Acid-Base Excess: 0 mmol/L (ref 0.0–2.0)
Acid-Base Excess: 1 mmol/L (ref 0.0–2.0)
Acid-Base Excess: 0 mmol/L (ref 0.0–2.0)
Acid-base deficit: 1 mmol/L (ref 0.0–2.0)
Bicarbonate: 27.1 mmol/L (ref 20.0–28.0)
Bicarbonate: 27.2 mmol/L (ref 20.0–28.0)
Bicarbonate: 26 mmol/L (ref 20.0–28.0)
Bicarbonate: 27.1 mmol/L (ref 20.0–28.0)
Bicarbonate: 26.5 mmol/L (ref 20.0–28.0)
Calcium, Ion: 1.22 mmol/L (ref 1.15–1.40)
Calcium, Ion: 1.22 mmol/L (ref 1.15–1.40)
Calcium, Ion: 1.28 mmol/L (ref 1.15–1.40)
Calcium, Ion: 1.21 mmol/L (ref 1.15–1.40)
Calcium, Ion: 1.23 mmol/L (ref 1.15–1.40)
HCT: 44 % (ref 39.0–52.0)
HCT: 44 % (ref 39.0–52.0)
HCT: 46 % (ref 39.0–52.0)
HCT: 48 % (ref 39.0–52.0)
HCT: 49 % (ref 39.0–52.0)
Hemoglobin: 15 g/dL (ref 13.0–17.0)
Hemoglobin: 15 g/dL (ref 13.0–17.0)
Hemoglobin: 15.6 g/dL (ref 13.0–17.0)
Hemoglobin: 16.3 g/dL (ref 13.0–17.0)
Hemoglobin: 16.7 g/dL (ref 13.0–17.0)
O2 Saturation: 100 %
O2 Saturation: 100 %
O2 Saturation: 96 %
O2 Saturation: 99 %
O2 Saturation: 97 %
Patient temperature: 34.5
Patient temperature: 37.4
Potassium: 4.5 mmol/L (ref 3.5–5.1)
Potassium: 4.5 mmol/L (ref 3.5–5.1)
Potassium: 4.4 mmol/L (ref 3.5–5.1)
Potassium: 4.7 mmol/L (ref 3.5–5.1)
Potassium: 4.5 mmol/L (ref 3.5–5.1)
Sodium: 138 mmol/L (ref 135–145)
Sodium: 138 mmol/L (ref 135–145)
Sodium: 138 mmol/L (ref 135–145)
Sodium: 136 mmol/L (ref 135–145)
Sodium: 137 mmol/L (ref 135–145)
TCO2: 29 mmol/L (ref 22–32)
TCO2: 29 mmol/L (ref 22–32)
TCO2: 28 mmol/L (ref 22–32)
TCO2: 28 mmol/L (ref 22–32)
TCO2: 28 mmol/L (ref 22–32)
pCO2 arterial: 53.7 mmHg — ABNORMAL HIGH (ref 32.0–48.0)
pCO2 arterial: 52.5 mmHg — ABNORMAL HIGH (ref 32.0–48.0)
pCO2 arterial: 44.2 mmHg (ref 32.0–48.0)
pCO2 arterial: 46.8 mmHg (ref 32.0–48.0)
pCO2 arterial: 49.4 mmHg — ABNORMAL HIGH (ref 32.0–48.0)
pH, Arterial: 7.312 — ABNORMAL LOW (ref 7.350–7.450)
pH, Arterial: 7.322 — ABNORMAL LOW (ref 7.350–7.450)
pH, Arterial: 7.367 (ref 7.350–7.450)
pH, Arterial: 7.371 (ref 7.350–7.450)
pH, Arterial: 7.339 — ABNORMAL LOW (ref 7.350–7.450)
pO2, Arterial: 215 mmHg — ABNORMAL HIGH (ref 83.0–108.0)
pO2, Arterial: 207 mmHg — ABNORMAL HIGH (ref 83.0–108.0)
pO2, Arterial: 77 mmHg — ABNORMAL LOW (ref 83.0–108.0)
pO2, Arterial: 141 mmHg — ABNORMAL HIGH (ref 83.0–108.0)
pO2, Arterial: 99 mmHg (ref 83.0–108.0)

## 2020-06-16 LAB — CBC
HCT: 50.6 % (ref 39.0–52.0)
HCT: 45.2 % (ref 39.0–52.0)
HCT: 46.3 % (ref 39.0–52.0)
Hemoglobin: 16.3 g/dL (ref 13.0–17.0)
Hemoglobin: 14.4 g/dL (ref 13.0–17.0)
Hemoglobin: 15.1 g/dL (ref 13.0–17.0)
MCH: 28.8 pg (ref 26.0–34.0)
MCH: 28.3 pg (ref 26.0–34.0)
MCH: 29 pg (ref 26.0–34.0)
MCHC: 32.2 g/dL (ref 30.0–36.0)
MCHC: 31.9 g/dL (ref 30.0–36.0)
MCHC: 32.6 g/dL (ref 30.0–36.0)
MCV: 89.6 fL (ref 80.0–100.0)
MCV: 89 fL (ref 80.0–100.0)
MCV: 88.9 fL (ref 80.0–100.0)
Platelets: 299 10*3/uL (ref 150–400)
Platelets: 214 10*3/uL (ref 150–400)
Platelets: 251 10*3/uL (ref 150–400)
RBC: 5.65 MIL/uL (ref 4.22–5.81)
RBC: 5.08 MIL/uL (ref 4.22–5.81)
RBC: 5.21 MIL/uL (ref 4.22–5.81)
RDW: 13.8 % (ref 11.5–15.5)
RDW: 13.7 % (ref 11.5–15.5)
RDW: 13.6 % (ref 11.5–15.5)
WBC: 9.7 10*3/uL (ref 4.0–10.5)
WBC: 6.2 10*3/uL (ref 4.0–10.5)
WBC: 12.2 10*3/uL — ABNORMAL HIGH (ref 4.0–10.5)
nRBC: 0 % (ref 0.0–0.2)
nRBC: 0 % (ref 0.0–0.2)
nRBC: 0 % (ref 0.0–0.2)

## 2020-06-16 LAB — BASIC METABOLIC PANEL
Anion gap: 10 (ref 5–15)
Anion gap: 7 (ref 5–15)
BUN: 10 mg/dL (ref 6–20)
BUN: 9 mg/dL (ref 6–20)
CO2: 26 mmol/L (ref 22–32)
CO2: 25 mmol/L (ref 22–32)
Calcium: 9.1 mg/dL (ref 8.9–10.3)
Calcium: 8.7 mg/dL — ABNORMAL LOW (ref 8.9–10.3)
Chloride: 99 mmol/L (ref 98–111)
Chloride: 104 mmol/L (ref 98–111)
Creatinine, Ser: 0.95 mg/dL (ref 0.61–1.24)
Creatinine, Ser: 0.91 mg/dL (ref 0.61–1.24)
GFR calc Af Amer: >60 mL/min (ref >60)
GFR calc Af Amer: >60 mL/min (ref >60)
GFR calc non Af Amer: >60 mL/min (ref >60)
GFR calc non Af Amer: >60 mL/min (ref >60)
Glucose, Bld: 149 mg/dL — ABNORMAL HIGH (ref 70–99)
Glucose, Bld: 133 mg/dL — ABNORMAL HIGH (ref 70–99)
Potassium: 3.9 mmol/L (ref 3.5–5.1)
Potassium: 4.5 mmol/L (ref 3.5–5.1)
Sodium: 135 mmol/L (ref 135–145)
Sodium: 136 mmol/L (ref 135–145)

## 2020-06-16 LAB — MAGNESIUM: Magnesium: 2.4 mg/dL (ref 1.7–2.4)

## 2020-06-16 LAB — PROTIME-INR
INR: 1.1 (ref 0.8–1.2)
Prothrombin Time: 14.2 seconds (ref 11.4–15.2)

## 2020-06-16 LAB — POCT I-STAT, CHEM 8
BUN: 11 mg/dL (ref 6–20)
BUN: 10 mg/dL (ref 6–20)
BUN: 10 mg/dL (ref 6–20)
Calcium, Ion: 1.32 mmol/L (ref 1.15–1.40)
Calcium, Ion: 1.22 mmol/L (ref 1.15–1.40)
Calcium, Ion: 1.18 mmol/L (ref 1.15–1.40)
Chloride: 98 mmol/L (ref 98–111)
Chloride: 100 mmol/L (ref 98–111)
Chloride: 102 mmol/L (ref 98–111)
Creatinine, Ser: 0.9 mg/dL (ref 0.61–1.24)
Creatinine, Ser: 0.8 mg/dL (ref 0.61–1.24)
Creatinine, Ser: 0.8 mg/dL (ref 0.61–1.24)
Glucose, Bld: 145 mg/dL — ABNORMAL HIGH (ref 70–99)
Glucose, Bld: 124 mg/dL — ABNORMAL HIGH (ref 70–99)
Glucose, Bld: 119 mg/dL — ABNORMAL HIGH (ref 70–99)
HCT: 49 % (ref 39.0–52.0)
HCT: 43 % (ref 39.0–52.0)
HCT: 43 % (ref 39.0–52.0)
Hemoglobin: 16.7 g/dL (ref 13.0–17.0)
Hemoglobin: 14.6 g/dL (ref 13.0–17.0)
Hemoglobin: 14.6 g/dL (ref 13.0–17.0)
Potassium: 4 mmol/L (ref 3.5–5.1)
Potassium: 4.5 mmol/L (ref 3.5–5.1)
Potassium: 4.3 mmol/L (ref 3.5–5.1)
Sodium: 139 mmol/L (ref 135–145)
Sodium: 138 mmol/L (ref 135–145)
Sodium: 139 mmol/L (ref 135–145)
TCO2: 28 mmol/L (ref 22–32)
TCO2: 27 mmol/L (ref 22–32)
TCO2: 26 mmol/L (ref 22–32)

## 2020-06-16 LAB — GLUCOSE, CAPILLARY
Glucose-Capillary: 125 mg/dL — ABNORMAL HIGH (ref 70–99)
Glucose-Capillary: 132 mg/dL — ABNORMAL HIGH (ref 70–99)
Glucose-Capillary: 128 mg/dL — ABNORMAL HIGH (ref 70–99)
Glucose-Capillary: 134 mg/dL — ABNORMAL HIGH (ref 70–99)
Glucose-Capillary: 130 mg/dL — ABNORMAL HIGH (ref 70–99)
Glucose-Capillary: 140 mg/dL — ABNORMAL HIGH (ref 70–99)
Glucose-Capillary: 169 mg/dL — ABNORMAL HIGH (ref 70–99)

## 2020-06-16 LAB — HEPARIN LEVEL (UNFRACTIONATED): Heparin Unfractionated: 0.42 IU/mL (ref 0.30–0.70)

## 2020-06-16 LAB — ECHO INTRAOPERATIVE TEE
Height: 68 in
Weight: 3548.8 oz

## 2020-06-16 LAB — HEMOGLOBIN A1C
Hgb A1c MFr Bld: 6.2 % — ABNORMAL HIGH (ref 4.8–5.6)
Mean Plasma Glucose: 131 mg/dL

## 2020-06-16 LAB — APTT: aPTT: 33 seconds (ref 24–36)

## 2020-06-16 SURGERY — CORONARY ARTERY BYPASS GRAFTING (CABG)
Anesthesia: General | Site: Chest

## 2020-06-16 MED ORDER — HEPARIN SODIUM (PORCINE) 1000 UNIT/ML IJ SOLN
INTRAMUSCULAR | Status: AC
  Filled 2020-06-16: qty 1

## 2020-06-16 MED ORDER — PROPOFOL 10 MG/ML IV BOLUS
INTRAVENOUS | Status: AC
  Filled 2020-06-16: qty 20

## 2020-06-16 MED ORDER — METOPROLOL TARTRATE 5 MG/5ML IV SOLN
2.5000 mg | INTRAVENOUS | Status: DC | PRN
  Administered 2020-06-17: 5 mg via INTRAVENOUS
  Filled 2020-06-16: qty 5

## 2020-06-16 MED ORDER — FENTANYL CITRATE (PF) 250 MCG/5ML IJ SOLN
INTRAMUSCULAR | Status: AC
  Filled 2020-06-16: qty 5

## 2020-06-16 MED ORDER — CHLORHEXIDINE GLUCONATE 0.12 % MT SOLN
15.0000 mL | OROMUCOSAL | Status: AC
  Administered 2020-06-16: 15 mL via OROMUCOSAL
  Filled 2020-06-16: qty 15

## 2020-06-16 MED ORDER — ESMOLOL HCL 100 MG/10ML IV SOLN
INTRAVENOUS | Status: DC | PRN
  Administered 2020-06-16 (×2): 20 mg via INTRAVENOUS

## 2020-06-16 MED ORDER — ACETAMINOPHEN 500 MG PO TABS
1000.0000 mg | ORAL_TABLET | Freq: Four times a day (QID) | ORAL | Status: DC
  Administered 2020-06-16 – 2020-06-18 (×5): 1000 mg via ORAL
  Filled 2020-06-16 (×6): qty 2

## 2020-06-16 MED ORDER — LACTATED RINGERS IV SOLN: 500.0000 mL | Freq: Once | INTRAVENOUS | Status: DC | PRN

## 2020-06-16 MED ORDER — VANCOMYCIN HCL IN DEXTROSE 1-5 GM/200ML-% IV SOLN
1000.0000 mg | Freq: Once | INTRAVENOUS | Status: AC
  Administered 2020-06-16: 1000 mg via INTRAVENOUS
  Filled 2020-06-16: qty 200

## 2020-06-16 MED ORDER — MIDAZOLAM HCL 5 MG/5ML IJ SOLN
INTRAMUSCULAR | Status: DC | PRN
  Administered 2020-06-16 (×4): 1 mg via INTRAVENOUS

## 2020-06-16 MED ORDER — NITROGLYCERIN IN D5W 200-5 MCG/ML-% IV SOLN: 0.0000 ug/min | INTRAVENOUS | Status: DC

## 2020-06-16 MED ORDER — TRAMADOL HCL 50 MG PO TABS
50.0000 mg | ORAL_TABLET | ORAL | Status: DC | PRN
  Administered 2020-06-16 – 2020-06-18 (×6): 100 mg via ORAL
  Filled 2020-06-16 (×7): qty 2

## 2020-06-16 MED ORDER — PHENYLEPHRINE HCL-NACL 20-0.9 MG/250ML-% IV SOLN: 0.0000 ug/min | INTRAVENOUS | Status: DC

## 2020-06-16 MED ORDER — SODIUM CHLORIDE 0.9% FLUSH
10.0000 mL | Freq: Two times a day (BID) | INTRAVENOUS | Status: DC
  Administered 2020-06-16 – 2020-06-18 (×3): 10 mL

## 2020-06-16 MED ORDER — SODIUM CHLORIDE 0.9% FLUSH: 10.0000 mL | INTRAVENOUS | Status: DC | PRN

## 2020-06-16 MED ORDER — FENTANYL CITRATE (PF) 250 MCG/5ML IJ SOLN
INTRAMUSCULAR | Status: AC
  Filled 2020-06-16: qty 15

## 2020-06-16 MED ORDER — FENTANYL CITRATE (PF) 250 MCG/5ML IJ SOLN
INTRAMUSCULAR | Status: DC | PRN
  Administered 2020-06-16: 50 ug via INTRAVENOUS
  Administered 2020-06-16 (×2): 100 ug via INTRAVENOUS
  Administered 2020-06-16 (×3): 50 ug via INTRAVENOUS
  Administered 2020-06-16: 100 ug via INTRAVENOUS
  Administered 2020-06-16 (×4): 50 ug via INTRAVENOUS
  Administered 2020-06-16: 200 ug via INTRAVENOUS
  Administered 2020-06-16: 100 ug via INTRAVENOUS
  Administered 2020-06-16 (×2): 50 ug via INTRAVENOUS

## 2020-06-16 MED ORDER — SODIUM CHLORIDE 0.9 % IV SOLN: INTRAVENOUS | Status: DC

## 2020-06-16 MED ORDER — HEPARIN SODIUM (PORCINE) 1000 UNIT/ML IJ SOLN
INTRAMUSCULAR | Status: DC | PRN
  Administered 2020-06-16: 10000 [IU] via INTRAVENOUS

## 2020-06-16 MED ORDER — INSULIN REGULAR(HUMAN) IN NACL 100-0.9 UT/100ML-% IV SOLN: INTRAVENOUS | Status: DC

## 2020-06-16 MED ORDER — PROPOFOL 10 MG/ML IV BOLUS
INTRAVENOUS | Status: DC | PRN
  Administered 2020-06-16: 40 mg via INTRAVENOUS
  Administered 2020-06-16: 80 mg via INTRAVENOUS

## 2020-06-16 MED ORDER — SODIUM CHLORIDE 0.9% FLUSH: 3.0000 mL | INTRAVENOUS | Status: DC | PRN

## 2020-06-16 MED ORDER — ACETAMINOPHEN 160 MG/5ML PO SOLN: 650.0000 mg | Freq: Once | ORAL | Status: AC

## 2020-06-16 MED ORDER — MORPHINE SULFATE (PF) 2 MG/ML IV SOLN
1.0000 mg | INTRAVENOUS | Status: DC | PRN
  Administered 2020-06-16: 2 mg via INTRAVENOUS
  Administered 2020-06-16: 4 mg via INTRAVENOUS
  Administered 2020-06-16 – 2020-06-17 (×2): 2 mg via INTRAVENOUS
  Administered 2020-06-17 (×2): 4 mg via INTRAVENOUS
  Filled 2020-06-16: qty 2
  Filled 2020-06-16: qty 1
  Filled 2020-06-16 (×2): qty 2
  Filled 2020-06-16 (×2): qty 1

## 2020-06-16 MED ORDER — HEMOSTATIC AGENTS (NO CHARGE) OPTIME
TOPICAL | Status: DC | PRN
  Administered 2020-06-16 (×3): 1 via TOPICAL

## 2020-06-16 MED ORDER — ROCURONIUM BROMIDE 10 MG/ML (PF) SYRINGE
PREFILLED_SYRINGE | INTRAVENOUS | Status: AC
  Filled 2020-06-16: qty 10

## 2020-06-16 MED ORDER — LIDOCAINE 2% (20 MG/ML) 5 ML SYRINGE
INTRAMUSCULAR | Status: AC
  Filled 2020-06-16: qty 5

## 2020-06-16 MED ORDER — ORAL CARE MOUTH RINSE
15.0000 mL | OROMUCOSAL | Status: DC
  Administered 2020-06-16 (×3): 15 mL via OROMUCOSAL

## 2020-06-16 MED ORDER — PHENYLEPHRINE 40 MCG/ML (10ML) SYRINGE FOR IV PUSH (FOR BLOOD PRESSURE SUPPORT)
PREFILLED_SYRINGE | INTRAVENOUS | Status: DC | PRN
  Administered 2020-06-16: 40 ug via INTRAVENOUS
  Administered 2020-06-16: 120 ug via INTRAVENOUS

## 2020-06-16 MED ORDER — SODIUM CHLORIDE (PF) 0.9 % IJ SOLN
INTRAMUSCULAR | Status: AC
  Filled 2020-06-16: qty 10

## 2020-06-16 MED ORDER — LACTATED RINGERS IV SOLN: INTRAVENOUS | Status: DC

## 2020-06-16 MED ORDER — SODIUM CHLORIDE 0.9% FLUSH
3.0000 mL | Freq: Two times a day (BID) | INTRAVENOUS | Status: DC
  Administered 2020-06-17 (×2): 3 mL via INTRAVENOUS

## 2020-06-16 MED ORDER — LACTATED RINGERS IV SOLN
INTRAVENOUS | Status: DC | PRN
Start: 2020-06-16 — End: 2020-06-16

## 2020-06-16 MED ORDER — CHLORHEXIDINE GLUCONATE CLOTH 2 % EX PADS
6.0000 | MEDICATED_PAD | Freq: Every day | CUTANEOUS | Status: DC
  Administered 2020-06-17 – 2020-06-21 (×4): 6 via TOPICAL

## 2020-06-16 MED ORDER — LIDOCAINE 2% (20 MG/ML) 5 ML SYRINGE
INTRAMUSCULAR | Status: DC | PRN
  Administered 2020-06-16: 100 mg via INTRAVENOUS

## 2020-06-16 MED ORDER — OXYCODONE HCL 5 MG PO TABS: 5.0000 mg | ORAL_TABLET | ORAL | Status: DC | PRN

## 2020-06-16 MED ORDER — FAMOTIDINE IN NACL 20-0.9 MG/50ML-% IV SOLN
INTRAVENOUS | Status: AC
  Administered 2020-06-16: 20 mg via INTRAVENOUS
  Filled 2020-06-16: qty 50

## 2020-06-16 MED ORDER — DOCUSATE SODIUM 100 MG PO CAPS
200.0000 mg | ORAL_CAPSULE | Freq: Every day | ORAL | Status: DC
  Administered 2020-06-17 – 2020-06-18 (×2): 200 mg via ORAL
  Filled 2020-06-16 (×2): qty 2

## 2020-06-16 MED ORDER — MAGNESIUM SULFATE 4 GM/100ML IV SOLN
4.0000 g | Freq: Once | INTRAVENOUS | Status: AC
  Administered 2020-06-16: 4 g via INTRAVENOUS
  Filled 2020-06-16: qty 100

## 2020-06-16 MED ORDER — PROTAMINE SULFATE 10 MG/ML IV SOLN
INTRAVENOUS | Status: AC
  Filled 2020-06-16: qty 10

## 2020-06-16 MED ORDER — BISACODYL 10 MG RE SUPP: 10.0000 mg | Freq: Every day | RECTAL | Status: DC

## 2020-06-16 MED ORDER — CHLORHEXIDINE GLUCONATE 0.12% ORAL RINSE (MEDLINE KIT): 15.0000 mL | Freq: Two times a day (BID) | OROMUCOSAL | Status: DC

## 2020-06-16 MED ORDER — METOPROLOL TARTRATE 12.5 MG HALF TABLET
12.5000 mg | ORAL_TABLET | Freq: Two times a day (BID) | ORAL | Status: DC
  Administered 2020-06-16 – 2020-06-18 (×3): 12.5 mg via ORAL
  Filled 2020-06-16 (×3): qty 1

## 2020-06-16 MED ORDER — ORAL CARE MOUTH RINSE
15.0000 mL | Freq: Two times a day (BID) | OROMUCOSAL | Status: DC
  Administered 2020-06-16 – 2020-06-20 (×7): 15 mL via OROMUCOSAL

## 2020-06-16 MED ORDER — PANTOPRAZOLE SODIUM 40 MG PO TBEC
40.0000 mg | DELAYED_RELEASE_TABLET | Freq: Every day | ORAL | Status: DC
  Administered 2020-06-18: 40 mg via ORAL
  Filled 2020-06-16: qty 1

## 2020-06-16 MED ORDER — POTASSIUM CHLORIDE 10 MEQ/50ML IV SOLN: 10.0000 meq | INTRAVENOUS | Status: AC

## 2020-06-16 MED ORDER — ARTIFICIAL TEARS OPHTHALMIC OINT
TOPICAL_OINTMENT | OPHTHALMIC | Status: AC
  Filled 2020-06-16: qty 3.5

## 2020-06-16 MED ORDER — 0.9 % SODIUM CHLORIDE (POUR BTL) OPTIME
TOPICAL | Status: DC | PRN
  Administered 2020-06-16: 3000 mL

## 2020-06-16 MED ORDER — MUPIROCIN 2 % EX OINT
1.0000 "application " | TOPICAL_OINTMENT | Freq: Two times a day (BID) | CUTANEOUS | Status: AC
  Administered 2020-06-16 – 2020-06-20 (×9): 1 via NASAL
  Filled 2020-06-16 (×2): qty 22

## 2020-06-16 MED ORDER — METOPROLOL TARTRATE 25 MG/10 ML ORAL SUSPENSION
12.5000 mg | Freq: Two times a day (BID) | ORAL | Status: DC
  Administered 2020-06-17: 12.5 mg
  Filled 2020-06-16 (×2): qty 5

## 2020-06-16 MED ORDER — MIDAZOLAM HCL (PF) 10 MG/2ML IJ SOLN
INTRAMUSCULAR | Status: AC
  Filled 2020-06-16: qty 2

## 2020-06-16 MED ORDER — BISACODYL 5 MG PO TBEC
10.0000 mg | DELAYED_RELEASE_TABLET | Freq: Every day | ORAL | Status: DC
  Administered 2020-06-17 – 2020-06-18 (×2): 10 mg via ORAL
  Filled 2020-06-16 (×2): qty 2

## 2020-06-16 MED ORDER — MIDAZOLAM HCL 2 MG/2ML IJ SOLN: 2.0000 mg | INTRAMUSCULAR | Status: DC | PRN

## 2020-06-16 MED ORDER — PLASMA-LYTE 148 IV SOLN
INTRAVENOUS | Status: DC | PRN
  Administered 2020-06-16: 500 mL via INTRAVASCULAR

## 2020-06-16 MED ORDER — DEXTROSE 50 % IV SOLN: 0.0000 mL | INTRAVENOUS | Status: DC | PRN

## 2020-06-16 MED ORDER — ROCURONIUM BROMIDE 10 MG/ML (PF) SYRINGE
PREFILLED_SYRINGE | INTRAVENOUS | Status: DC | PRN
  Administered 2020-06-16: 20 mg via INTRAVENOUS
  Administered 2020-06-16: 100 mg via INTRAVENOUS
  Administered 2020-06-16: 10 mg via INTRAVENOUS
  Administered 2020-06-16: 30 mg via INTRAVENOUS
  Administered 2020-06-16 (×2): 20 mg via INTRAVENOUS

## 2020-06-16 MED ORDER — SODIUM CHLORIDE 0.9 % IV SOLN
1.5000 g | Freq: Two times a day (BID) | INTRAVENOUS | Status: AC
  Administered 2020-06-16 – 2020-06-18 (×4): 1.5 g via INTRAVENOUS
  Filled 2020-06-16 (×4): qty 1.5

## 2020-06-16 MED ORDER — ALBUMIN HUMAN 5 % IV SOLN: 250.0000 mL | INTRAVENOUS | Status: DC | PRN

## 2020-06-16 MED ORDER — ASPIRIN 81 MG PO CHEW: 324.0000 mg | CHEWABLE_TABLET | Freq: Every day | ORAL | Status: DC

## 2020-06-16 MED ORDER — GLYCOPYRROLATE 0.2 MG/ML IJ SOLN
INTRAMUSCULAR | Status: DC | PRN
Start: 2020-06-16 — End: 2020-06-16
  Administered 2020-06-16: .2 mg via INTRAVENOUS

## 2020-06-16 MED ORDER — GLYCOPYRROLATE PF 0.2 MG/ML IJ SOSY
PREFILLED_SYRINGE | INTRAMUSCULAR | Status: AC
  Filled 2020-06-16: qty 1

## 2020-06-16 MED ORDER — ONDANSETRON HCL 4 MG/2ML IJ SOLN
4.0000 mg | Freq: Four times a day (QID) | INTRAMUSCULAR | Status: DC | PRN
  Administered 2020-06-17: 4 mg via INTRAVENOUS
  Filled 2020-06-16: qty 2

## 2020-06-16 MED ORDER — PHENYLEPHRINE 40 MCG/ML (10ML) SYRINGE FOR IV PUSH (FOR BLOOD PRESSURE SUPPORT)
PREFILLED_SYRINGE | INTRAVENOUS | Status: AC
  Filled 2020-06-16: qty 10

## 2020-06-16 MED ORDER — PROTAMINE SULFATE 10 MG/ML IV SOLN
INTRAVENOUS | Status: DC | PRN
  Administered 2020-06-16: 100 mg via INTRAVENOUS

## 2020-06-16 MED ORDER — CHLORHEXIDINE GLUCONATE CLOTH 2 % EX PADS
6.0000 | MEDICATED_PAD | Freq: Every day | CUTANEOUS | Status: DC
  Administered 2020-06-16 – 2020-06-18 (×3): 6 via TOPICAL

## 2020-06-16 MED ORDER — SODIUM CHLORIDE 0.45 % IV SOLN: INTRAVENOUS | Status: DC | PRN

## 2020-06-16 MED ORDER — SODIUM CHLORIDE 0.9 % IV SOLN: 250.0000 mL | INTRAVENOUS | Status: DC

## 2020-06-16 MED ORDER — ACETAMINOPHEN 160 MG/5ML PO SOLN
1000.0000 mg | Freq: Four times a day (QID) | ORAL | Status: DC
  Administered 2020-06-17: 1000 mg
  Filled 2020-06-16 (×2): qty 40.6

## 2020-06-16 MED ORDER — ACETAMINOPHEN 650 MG RE SUPP
650.0000 mg | Freq: Once | RECTAL | Status: AC
  Administered 2020-06-16: 650 mg via RECTAL

## 2020-06-16 MED ORDER — DEXMEDETOMIDINE HCL IN NACL 400 MCG/100ML IV SOLN
0.0000 ug/kg/h | INTRAVENOUS | Status: DC
  Administered 2020-06-16: 0.5 ug/kg/h via INTRAVENOUS
  Filled 2020-06-16: qty 100

## 2020-06-16 MED ORDER — FAMOTIDINE IN NACL 20-0.9 MG/50ML-% IV SOLN: 20.0000 mg | Freq: Two times a day (BID) | INTRAVENOUS | Status: AC

## 2020-06-16 MED ORDER — ASPIRIN EC 325 MG PO TBEC
325.0000 mg | DELAYED_RELEASE_TABLET | Freq: Every day | ORAL | Status: DC
  Administered 2020-06-17 – 2020-06-18 (×2): 325 mg via ORAL
  Filled 2020-06-16 (×2): qty 1

## 2020-06-16 MED ORDER — ALBUMIN HUMAN 5 % IV SOLN
INTRAVENOUS | Status: DC | PRN
Start: 2020-06-16 — End: 2020-06-16

## 2020-06-16 SURGICAL SUPPLY — 61 items
BAG DECANTER FOR FLEXI CONT (MISCELLANEOUS) ×4 IMPLANT
BLADE CLIPPER SURG (BLADE) ×4 IMPLANT
BLADE STERNUM SYSTEM 6 (BLADE) ×4 IMPLANT
BNDG ELASTIC 4X5.8 VLCR STR LF (GAUZE/BANDAGES/DRESSINGS) ×4 IMPLANT
BNDG ELASTIC 6X5.8 VLCR STR LF (GAUZE/BANDAGES/DRESSINGS) ×4 IMPLANT
BNDG GAUZE ELAST 4 BULKY (GAUZE/BANDAGES/DRESSINGS) ×4 IMPLANT
CANISTER SUCT 3000ML PPV (MISCELLANEOUS) ×4 IMPLANT
CATH CPB KIT GERHARDT (MISCELLANEOUS) ×4 IMPLANT
CATH THORACIC 28FR (CATHETERS) ×4 IMPLANT
DRAIN CHANNEL 28F RND 3/8 FF (WOUND CARE) ×4 IMPLANT
DRAPE CARDIOVASCULAR INCISE (DRAPES) ×2
DRAPE SLUSH/WARMER DISC (DRAPES) ×4 IMPLANT
DRAPE SRG 135X102X78XABS (DRAPES) ×2 IMPLANT
DRSG AQUACEL AG ADV 3.5X14 (GAUZE/BANDAGES/DRESSINGS) ×4 IMPLANT
ELECT BLADE 4.0 EZ CLEAN MEGAD (MISCELLANEOUS) ×4
ELECT REM PT RETURN 9FT ADLT (ELECTROSURGICAL) ×8
ELECTRODE BLDE 4.0 EZ CLN MEGD (MISCELLANEOUS) ×2 IMPLANT
ELECTRODE REM PT RTRN 9FT ADLT (ELECTROSURGICAL) ×4 IMPLANT
FELT TEFLON 1X6 (MISCELLANEOUS) ×8 IMPLANT
FILTER SMOKE EVAC ULPA (FILTER) ×4 IMPLANT
GAUZE SPONGE 4X4 12PLY STRL (GAUZE/BANDAGES/DRESSINGS) ×4 IMPLANT
GLOVE BIO SURGEON STRL SZ 6.5 (GLOVE) ×9 IMPLANT
GLOVE BIO SURGEONS STRL SZ 6.5 (GLOVE) ×3
GOWN STRL REUS W/ TWL LRG LVL3 (GOWN DISPOSABLE) ×8 IMPLANT
GOWN STRL REUS W/TWL LRG LVL3 (GOWN DISPOSABLE) ×8
HEMOSTAT POWDER SURGIFOAM 1G (HEMOSTASIS) ×12 IMPLANT
HEMOSTAT SURGICEL 2X14 (HEMOSTASIS) ×4 IMPLANT
KIT BASIN OR (CUSTOM PROCEDURE TRAY) ×4 IMPLANT
KIT CATH SUCT 8FR (CATHETERS) ×4 IMPLANT
KIT SUCTION CATH 14FR (SUCTIONS) ×8 IMPLANT
KIT TURNOVER KIT B (KITS) ×4 IMPLANT
KIT VASOVIEW HEMOPRO 2 VH 4000 (KITS) ×4 IMPLANT
LEAD PACING MYOCARDI (MISCELLANEOUS) ×4 IMPLANT
MARKER GRAFT CORONARY BYPASS (MISCELLANEOUS) ×12 IMPLANT
NS IRRIG 1000ML POUR BTL (IV SOLUTION) ×20 IMPLANT
OFFPUMP STABILIZER SUV (MISCELLANEOUS) ×4 IMPLANT
PACK E OPEN HEART (SUTURE) ×4 IMPLANT
PACK OPEN HEART (CUSTOM PROCEDURE TRAY) ×4 IMPLANT
PAD ARMBOARD 7.5X6 YLW CONV (MISCELLANEOUS) ×8 IMPLANT
PAD ELECT DEFIB RADIOL ZOLL (MISCELLANEOUS) ×4 IMPLANT
PENCIL BUTTON HOLSTER BLD 10FT (ELECTRODE) ×4 IMPLANT
PENCIL SMOKE EVACUATOR (MISCELLANEOUS) ×4 IMPLANT
POSITIONER ACROBAT-I OFFPUMP (MISCELLANEOUS) ×4 IMPLANT
POSITIONER HEAD DONUT 9IN (MISCELLANEOUS) ×4 IMPLANT
SLEEVE SUCTION 125 (MISCELLANEOUS) ×4 IMPLANT
SUT BONE WAX W31G (SUTURE) ×4 IMPLANT
SUT PROLENE 3 0 SH1 36 (SUTURE) ×4 IMPLANT
SUT PROLENE 4 0 TF (SUTURE) ×8 IMPLANT
SUT PROLENE 6 0 CC (SUTURE) ×8 IMPLANT
SUT PROLENE 7 0 BV1 MDA (SUTURE) ×4 IMPLANT
SUT STEEL 6MS V (SUTURE) ×4 IMPLANT
SUT STEEL SZ 6 DBL 3X14 BALL (SUTURE) ×4 IMPLANT
SUT VIC AB 1 CTX 18 (SUTURE) ×8 IMPLANT
SYSTEM SAHARA CHEST DRAIN ATS (WOUND CARE) ×4 IMPLANT
TOWEL GREEN STERILE (TOWEL DISPOSABLE) ×4 IMPLANT
TOWEL GREEN STERILE FF (TOWEL DISPOSABLE) ×4 IMPLANT
TRAP FLUID SMOKE EVACUATOR (MISCELLANEOUS) ×4 IMPLANT
TRAY FOLEY SLVR 16FR TEMP STAT (SET/KITS/TRAYS/PACK) ×4 IMPLANT
TUBING LAP HI FLOW INSUFFLATIO (TUBING) ×4 IMPLANT
UNDERPAD 30X36 HEAVY ABSORB (UNDERPADS AND DIAPERS) ×4 IMPLANT
WATER STERILE IRR 1000ML POUR (IV SOLUTION) ×8 IMPLANT

## 2020-06-16 NOTE — Transfer of Care (Signed)
Immediate Anesthesia Transfer of Care Note  Patient: Jani Ploeger  Procedure(s) Performed: CORONARY ARTERY BYPASS GRAFTING (CABG) LIMA to LAD. (N/A Chest) TRANSESOPHAGEAL ECHOCARDIOGRAM (TEE) (N/A )  Patient Location: SICU  Anesthesia Type:General  Level of Consciousness: Patient remains intubated per anesthesia plan  Airway & Oxygen Therapy: Patient remains intubated per anesthesia plan and Patient placed on Ventilator (see vital sign flow sheet for setting)  Post-op Assessment: Report given to RN and Post -op Vital signs reviewed and stable  Post vital signs: Reviewed and stable  Last Vitals:  Vitals Value Taken Time  BP 106/70 06/16/20 1202  Temp 34.5 C 06/16/20 1211  Pulse 70 06/16/20 1211  Resp 10 06/16/20 1211  SpO2 98 % 06/16/20 1211  Vitals shown include unvalidated device data.  Last Pain:  Vitals:   06/16/20 0403  TempSrc: Oral  PainSc:       Patients Stated Pain Goal: 0 (06/14/20 2037)  Complications: No complications documented.

## 2020-06-16 NOTE — Procedures (Signed)
Extubation Procedure Note  Patient Details:   Name: Mike Frazier DOB: 1981-04-08 MRN: [id number]   Airway Documentation:    Vent end date: 06/16/20 Vent end time: 1635   Evaluation  O2 sats: stable throughout Complications: No apparent complications Patient did tolerate procedure well. Bilateral Breath Sounds: Clear   Yes  Toula Moos 06/16/2020, 4:51 PM

## 2020-06-16 NOTE — Brief Op Note (Addendum)
      [address]       [place] [address]             [phone]     06/16/2020  10:46 AM  PATIENT:  Mike Frazier  39 y.o. male  PRE-OPERATIVE DIAGNOSIS:  CAD  POST-OPERATIVE DIAGNOSIS:  Same   PROCEDURE:   OFF-PUMP CORONARY ARTERY BYPASS GRAFTING  X 1  -LIMA to LAD  TRANSESOPHAGEAL ECHOCARDIOGRAM   SURGEON:   Delight Ovens, MD - Primary  PHYSICIAN ASSISTANT:  Rodden[place]  ANESTHESIA:   general  EBL:  Per anesthesia record.    BLOOD ADMINISTERED:none  DRAINS: Mediastinal and left pleural drains   LOCAL MEDICATIONS USED:  NONE  SPECIMEN:  No Specimen  DISPOSITION OF SPECIMEN:  N/A  COUNTS:  YES   DICTATION: .Dragon Dictation  PLAN OF CARE: Admit to inpatient   PATIENT DISPOSITION:  ICU - intubated and hemodynamically stable.   Delay start of Pharmacological VTE agent (>24hrs) due to surgical blood loss or risk of bleeding: yes

## 2020-06-16 NOTE — Anesthesia Procedure Notes (Signed)
Procedure Name: Intubation Date/Time: 06/16/2020 7:46 AM Performed by: Alain Marion, CRNA Pre-anesthesia Checklist: Patient identified, Emergency Drugs available, Suction available and Patient being monitored Patient Re-evaluated:Patient Re-evaluated prior to induction Oxygen Delivery Method: Circle System Utilized Preoxygenation: Pre-oxygenation with 100% oxygen Induction Type: IV induction Ventilation: Mask ventilation without difficulty Laryngoscope Size: Mac and 4 Grade View: Grade I Tube type: Oral Tube size: 8.0 mm Number of attempts: 1 Airway Equipment and Method: Stylet Placement Confirmation: ETT inserted through vocal cords under direct vision,  positive ETCO2 and breath sounds checked- equal and bilateral Secured at: 23 cm Tube secured with: Tape Dental Injury: Teeth and Oropharynx as per pre-operative assessment  Comments: Performed by Reece Agar, SRNA under the supervision of CRNA and MDA

## 2020-06-16 NOTE — Anesthesia Procedure Notes (Signed)
Central Venous Catheter Insertion Performed by: Gaynelle Adu, MD, anesthesiologist Start/End6/18/2021 6:30 AM, 06/16/2020 6:45 AM Patient location: Pre-op. Preanesthetic checklist: patient identified, IV checked, site marked, risks and benefits discussed, surgical consent, monitors and equipment checked, pre-op evaluation, timeout performed and anesthesia consent Position: Trendelenburg Lidocaine 1% used for infiltration and patient sedated Hand hygiene performed , maximum sterile barriers used  and Seldinger technique used Catheter size: 8.5 Fr Total catheter length 10. Central line was placed.Sheath introducer Swan type:thermodilution Procedure performed using ultrasound guided technique. Ultrasound Notes:anatomy identified, needle tip was noted to be adjacent to the nerve/plexus identified, no ultrasound evidence of intravascular and/or intraneural injection and image(s) printed for medical record Attempts: 1 Following insertion, line sutured, dressing applied and Biopatch. Post procedure assessment: blood return through all ports, free fluid flow and no air  Patient tolerated the procedure well with no immediate complications.

## 2020-06-16 NOTE — Anesthesia Procedure Notes (Signed)
Arterial Line Insertion Start/End6/18/2021 6:45 AM, 06/16/2020 6:55 AM Performed by: Cecile Hearing, MD, Mayer Camel, CRNA, CRNA  Patient location: OOR procedure area. Lidocaine 1% used for infiltration radial was placed Catheter size: 20 G Hand hygiene performed  and maximum sterile barriers used  Allen's test indicative of satisfactory collateral circulation Attempts: 1 Procedure performed without using ultrasound guided technique. Following insertion, dressing applied and Biopatch. Post procedure assessment: normal  Patient tolerated the procedure well with no immediate complications. Additional procedure comments: Performed by Tressia Miners, SRNA under the supervision of CRNA and MDA.

## 2020-06-16 NOTE — Anesthesia Postprocedure Evaluation (Signed)
Anesthesia Post Note  Patient: Mike Frazier  Procedure(s) Performed: CORONARY ARTERY BYPASS GRAFTING (CABG) LIMA to LAD. (N/A Chest) TRANSESOPHAGEAL ECHOCARDIOGRAM (TEE) (N/A )     Patient location during evaluation: SICU Anesthesia Type: General Level of consciousness: sedated Pain management: pain level controlled Vital Signs Assessment: post-procedure vital signs reviewed and stable Respiratory status: patient remains intubated per anesthesia plan Cardiovascular status: stable Postop Assessment: no apparent nausea or vomiting Anesthetic complications: no   No complications documented.  Last Vitals:  Vitals:   06/16/20 1230 06/16/20 1245  BP:    Pulse: 69 66  Resp: 12 12  Temp: (!) 34.6 C (!) 34.7 C  SpO2: 99% 100%    Last Pain:  Vitals:   06/16/20 0403  TempSrc: Oral  PainSc:                  Cecile Hearing

## 2020-06-16 NOTE — Progress Notes (Signed)
      [address]       [name] [address]             [phone]    Pre Procedure note for inpatients:   Mike Frazier has been scheduled for Procedure(s) with comments: CORONARY ARTERY BYPASS GRAFTING (CABG) (N/A) - POSSIBLY OFF-PUMP TRANSESOPHAGEAL ECHOCARDIOGRAM (TEE) (N/A) today. The various methods of treatment have been discussed with the patient. After consideration of the risks, benefits and treatment options the patient has consented to the planned procedure.   The patient has been seen and labs reviewed. There are no changes in the patient's condition to prevent proceeding with the planned procedure today.  Recent labs:  Lab Results  Component Value Date   WBC 9.7 06/16/2020   HGB 16.3 06/16/2020   HCT 50.6 06/16/2020   PLT 299 06/16/2020   GLUCOSE 149 (H) 06/16/2020   CHOL 137 01/20/2020   TRIG 105 01/20/2020   HDL 49 01/20/2020   LDLCALC 69 01/20/2020   ALT 16 01/20/2020   AST 16 01/20/2020   NA 135 06/16/2020   K 3.9 06/16/2020   CL 99 06/16/2020   CREATININE 0.95 06/16/2020   BUN 10 06/16/2020   CO2 26 06/16/2020   TSH 0.512 01/20/2020   INR 1.0 06/15/2020   HGBA1C 6.3 (H) 06/09/2020    Delight Ovens, MD 06/16/2020 7:10 AM

## 2020-06-16 NOTE — Progress Notes (Signed)
  Echocardiogram Echocardiogram Transesophageal has been performed.  Delcie Roch 06/16/2020, 8:15 AM

## 2020-06-16 NOTE — OR Nursing (Signed)
Mother notified of start of surgery.

## 2020-06-16 NOTE — Progress Notes (Addendum)
Patient interviewed in the pre-op area. Patient able to confirm name, DOB, procedure, allergies, no metal in body, npo status and no pain at this time.  Patient arrived to the OR sedated but responds to verbal stimuli. Patient moved over to OR bed with maximum assistance.    Yvetta Coder, RN

## 2020-06-16 NOTE — Progress Notes (Signed)
Patient ID: Mike Frazier, male   DOB: Feb 28, 1981, 39 y.o.   MRN: [id number]  TCTS Evening Rounds:   Hemodynamically stable  CI = 3.7  Just extubated  Urine output good  CT output low  CBC    Component Value Date/Time   WBC 12.2 (H) 06/16/2020 1750   RBC 5.21 06/16/2020 1750   HGB 15.1 06/16/2020 1750   HGB 17.0 01/20/2020 0950   HCT 46.3 06/16/2020 1750   HCT 50.7 01/20/2020 0950   PLT 251 06/16/2020 1750   PLT 272 01/20/2020 0950   MCV 88.9 06/16/2020 1750   MCV 86 01/20/2020 0950   MCH 29.0 06/16/2020 1750   MCHC 32.6 06/16/2020 1750   RDW 13.6 06/16/2020 1750   RDW 12.8 01/20/2020 0950   LYMPHSABS 5.5 (H) 12/16/2018 0325   MONOABS 1.1 (H) 12/16/2018 0325   EOSABS 0.3 12/16/2018 0325   BASOSABS 0.1 12/16/2018 0325     BMET    Component Value Date/Time   NA 136 06/16/2020 1750   NA 139 01/20/2020 0950   K 4.5 06/16/2020 1750   CL 104 06/16/2020 1750   CO2 25 06/16/2020 1750   GLUCOSE 133 (H) 06/16/2020 1750   BUN 9 06/16/2020 1750   BUN 11 01/20/2020 0950   CREATININE 0.91 06/16/2020 1750   CALCIUM 8.7 (L) 06/16/2020 1750   GFRNONAA >60 06/16/2020 1750   GFRAA >60 06/16/2020 1750     A/P:  Stable postop course. Continue current plans

## 2020-06-17 ENCOUNTER — Inpatient Hospital Stay (HOSPITAL_COMMUNITY): Payer: 59

## 2020-06-17 LAB — CBC
HCT: 45.9 % (ref 39.0–52.0)
HCT: 47.9 % (ref 39.0–52.0)
Hemoglobin: 14.8 g/dL (ref 13.0–17.0)
Hemoglobin: 15.3 g/dL (ref 13.0–17.0)
MCH: 28.7 pg (ref 26.0–34.0)
MCH: 28.6 pg (ref 26.0–34.0)
MCHC: 32.2 g/dL (ref 30.0–36.0)
MCHC: 31.9 g/dL (ref 30.0–36.0)
MCV: 89.1 fL (ref 80.0–100.0)
MCV: 89.5 fL (ref 80.0–100.0)
Platelets: 249 10*3/uL (ref 150–400)
Platelets: 249 10*3/uL (ref 150–400)
RBC: 5.15 MIL/uL (ref 4.22–5.81)
RBC: 5.35 MIL/uL (ref 4.22–5.81)
RDW: 13.9 % (ref 11.5–15.5)
RDW: 14.1 % (ref 11.5–15.5)
WBC: 15.1 10*3/uL — ABNORMAL HIGH (ref 4.0–10.5)
WBC: 14.9 10*3/uL — ABNORMAL HIGH (ref 4.0–10.5)
nRBC: 0 % (ref 0.0–0.2)
nRBC: 0 % (ref 0.0–0.2)

## 2020-06-17 LAB — BASIC METABOLIC PANEL
Anion gap: 9 (ref 5–15)
Anion gap: 9 (ref 5–15)
BUN: 12 mg/dL (ref 6–20)
BUN: 14 mg/dL (ref 6–20)
CO2: 24 mmol/L (ref 22–32)
CO2: 28 mmol/L (ref 22–32)
Calcium: 9 mg/dL (ref 8.9–10.3)
Calcium: 9 mg/dL (ref 8.9–10.3)
Chloride: 99 mmol/L (ref 98–111)
Chloride: 97 mmol/L — ABNORMAL LOW (ref 98–111)
Creatinine, Ser: 0.89 mg/dL (ref 0.61–1.24)
Creatinine, Ser: 0.95 mg/dL (ref 0.61–1.24)
GFR calc Af Amer: >60 mL/min (ref >60)
GFR calc Af Amer: >60 mL/min (ref >60)
GFR calc non Af Amer: >60 mL/min (ref >60)
GFR calc non Af Amer: >60 mL/min (ref >60)
Glucose, Bld: 128 mg/dL — ABNORMAL HIGH (ref 70–99)
Glucose, Bld: 135 mg/dL — ABNORMAL HIGH (ref 70–99)
Potassium: 4.2 mmol/L (ref 3.5–5.1)
Potassium: 4.1 mmol/L (ref 3.5–5.1)
Sodium: 132 mmol/L — ABNORMAL LOW (ref 135–145)
Sodium: 134 mmol/L — ABNORMAL LOW (ref 135–145)

## 2020-06-17 LAB — GLUCOSE, CAPILLARY
Glucose-Capillary: 108 mg/dL — ABNORMAL HIGH (ref 70–99)
Glucose-Capillary: 118 mg/dL — ABNORMAL HIGH (ref 70–99)
Glucose-Capillary: 124 mg/dL — ABNORMAL HIGH (ref 70–99)
Glucose-Capillary: 134 mg/dL — ABNORMAL HIGH (ref 70–99)
Glucose-Capillary: 135 mg/dL — ABNORMAL HIGH (ref 70–99)
Glucose-Capillary: 135 mg/dL — ABNORMAL HIGH (ref 70–99)
Glucose-Capillary: 147 mg/dL — ABNORMAL HIGH (ref 70–99)
Glucose-Capillary: 158 mg/dL — ABNORMAL HIGH (ref 70–99)
Glucose-Capillary: 98 mg/dL (ref 70–99)

## 2020-06-17 LAB — CREATININE, SERUM
Creatinine, Ser: 0.97 mg/dL (ref 0.61–1.24)
GFR calc Af Amer: >60 mL/min (ref >60)
GFR calc non Af Amer: >60 mL/min (ref >60)

## 2020-06-17 LAB — MAGNESIUM
Magnesium: 1.9 mg/dL (ref 1.7–2.4)
Magnesium: 1.9 mg/dL (ref 1.7–2.4)

## 2020-06-17 MED ORDER — POTASSIUM CHLORIDE CRYS ER 20 MEQ PO TBCR
20.0000 meq | EXTENDED_RELEASE_TABLET | Freq: Once | ORAL | Status: AC
  Administered 2020-06-17: 20 meq via ORAL
  Filled 2020-06-17: qty 1

## 2020-06-17 MED ORDER — INSULIN DETEMIR 100 UNIT/ML ~~LOC~~ SOLN
10.0000 [IU] | Freq: Every day | SUBCUTANEOUS | Status: DC
  Filled 2020-06-17: qty 0.1

## 2020-06-17 MED ORDER — FUROSEMIDE 10 MG/ML IJ SOLN
40.0000 mg | Freq: Once | INTRAMUSCULAR | Status: AC
  Administered 2020-06-17: 40 mg via INTRAVENOUS
  Filled 2020-06-17: qty 4

## 2020-06-17 MED ORDER — INSULIN ASPART 100 UNIT/ML ~~LOC~~ SOLN
0.0000 [IU] | SUBCUTANEOUS | Status: DC
  Administered 2020-06-17 – 2020-06-18 (×2): 2 [IU] via SUBCUTANEOUS

## 2020-06-17 MED ORDER — KETOROLAC TROMETHAMINE 15 MG/ML IJ SOLN: 15.0000 mg | Freq: Four times a day (QID) | INTRAMUSCULAR | Status: DC | PRN

## 2020-06-17 MED ORDER — INSULIN DETEMIR 100 UNIT/ML ~~LOC~~ SOLN
20.0000 [IU] | Freq: Every day | SUBCUTANEOUS | Status: DC
  Administered 2020-06-17 – 2020-06-18 (×2): 20 [IU] via SUBCUTANEOUS
  Filled 2020-06-17 (×2): qty 0.2

## 2020-06-17 MED ORDER — LEVALBUTEROL HCL 0.63 MG/3ML IN NEBU
0.6300 mg | INHALATION_SOLUTION | Freq: Four times a day (QID) | RESPIRATORY_TRACT | Status: DC | PRN
  Administered 2020-06-17: 0.63 mg via RESPIRATORY_TRACT
  Filled 2020-06-17: qty 3

## 2020-06-17 MED ORDER — ENOXAPARIN SODIUM 40 MG/0.4ML ~~LOC~~ SOLN
40.0000 mg | Freq: Every day | SUBCUTANEOUS | Status: DC
  Administered 2020-06-17 – 2020-06-20 (×4): 40 mg via SUBCUTANEOUS
  Filled 2020-06-17 (×4): qty 0.4

## 2020-06-17 NOTE — Progress Notes (Signed)
Patient ID: Mike Frazier, male   DOB: [birth year], 39 y.o.   MRN: [id number] TCTS Evening Rounds:  Hemodynamically stable in sinus rhythm 86  sats 91% on Haliimaile.  Urine output good with lasix this am. Will give him another dose tonight.  BMET    Component Value Date/Time   NA 134 (L) 06/17/2020 1727   NA 139 01/20/2020 0950   K 4.1 06/17/2020 1727   CL 97 (L) 06/17/2020 1727   CO2 28 06/17/2020 1727   GLUCOSE 135 (H) 06/17/2020 1727   BUN 14 06/17/2020 1727   BUN 11 01/20/2020 0950   CREATININE 0.95 06/17/2020 1727   CALCIUM 9.0 06/17/2020 1727   GFRNONAA >60 06/17/2020 1727   GFRAA >60 06/17/2020 1727   CBC    Component Value Date/Time   WBC 14.9 (H) 06/17/2020 1727   RBC 5.35 06/17/2020 1727   HGB 15.3 06/17/2020 1727   HGB 17.0 01/20/2020 0950   HCT 47.9 06/17/2020 1727   HCT 50.7 01/20/2020 0950   PLT 249 06/17/2020 1727   PLT 272 01/20/2020 0950   MCV 89.5 06/17/2020 1727   MCV 86 01/20/2020 0950   MCH 28.6 06/17/2020 1727   MCHC 31.9 06/17/2020 1727   RDW 14.1 06/17/2020 1727   RDW 12.8 01/20/2020 0950   LYMPHSABS 5.5 (H) 12/16/2018 0325   MONOABS 1.1 (H) 12/16/2018 0325   EOSABS 0.3 12/16/2018 0325   BASOSABS 0.1 12/16/2018 0325

## 2020-06-17 NOTE — Progress Notes (Signed)
EKG CRITICAL VALUE     12 lead EKG performed.  Critical value noted. Mason Jim, April, RN notified.   Armelia Penton, CCT 06/17/2020 8:44 AM

## 2020-06-17 NOTE — Progress Notes (Signed)
1 Day Post-Op Procedure(s) (LRB): CORONARY ARTERY BYPASS GRAFTING (CABG) LIMA to LAD. (N/A) TRANSESOPHAGEAL ECHOCARDIOGRAM (TEE) (N/A) Subjective: No complaints  Objective: Vital signs in last 24 hours: Temp:  [94.1 F (34.5 C)-100 F (37.8 C)] 99.3 F (37.4 C) (06/19 0800) Pulse Rate:  [66-96] 75 (06/19 0800) Cardiac Rhythm: Normal sinus rhythm (06/19 0400) Resp:  [12-21] 14 (06/19 0800) BP: (105-130)/(63-105) 114/74 (06/19 0600) SpO2:  [97 %-100 %] 97 % (06/19 0800) Arterial Line BP: (97-142)/(64-83) 97/70 (06/19 0800) FiO2 (%):  [40 %-50 %] 40 % (06/18 1549) Weight:  [103.5 kg] 103.5 kg (06/19 0500)  Hemodynamic parameters for last 24 hours: CVP:  [0 mmHg-83 mmHg] 20 mmHg CO:  [5.9 L/min-8.7 L/min] 5.9 L/min CI:  [2.7 L/min/m2-4 L/min/m2] 2.7 L/min/m2  Intake/Output from previous day: 06/18 0701 - 06/19 0700 In: 3930.6 [I.V.:2538.3; IV Piggyback:1392.3] Out: 1670 [Urine:1295; Emesis/NG output:50; Blood:75; Chest Tube:250] Intake/Output this shift: No intake/output data recorded.  General appearance: alert and cooperative Neurologic: intact Heart: regular rate and rhythm, S1, S2 normal, no murmur Lungs: wheezes bilaterally Extremities: edema mild Wound: dressing dry  Lab Results: Recent Labs    06/16/20 1750 06/17/20 0407  WBC 12.2* 15.1*  HGB 15.1 14.8  HCT 46.3 45.9  PLT 251 249   BMET:  Recent Labs    06/16/20 1750 06/17/20 0407  NA 136 132*  K 4.5 4.2  CL 104 99  CO2 25 24  GLUCOSE 133* 128*  BUN 9 12  CREATININE 0.91 0.89  CALCIUM 8.7* 9.0    PT/INR:  Recent Labs    06/16/20 1208  LABPROT 14.2  INR 1.1   ABG    Component Value Date/Time   PHART 7.339 (L) 06/16/2020 1748   HCO3 26.5 06/16/2020 1748   TCO2 28 06/16/2020 1748   ACIDBASEDEF 1.0 06/16/2020 1211   O2SAT 97.0 06/16/2020 1748   CBG (last 3)  Recent Labs    06/17/20 0411 06/17/20 0617 06/17/20 0812  GLUCAP 135* 134* 158*   CXR: mild left lung atelectasis  ECG:  sinus, mild diffuse ST elevation suggests pericarditis.  Assessment/Plan: S/P Procedure(s) (LRB): CORONARY ARTERY BYPASS GRAFTING (CABG) LIMA to LAD. (N/A) TRANSESOPHAGEAL ECHOCARDIOGRAM (TEE) (N/A)  POD 1 Hemodynamically stable in sinus rhythm. Continue low dose Lopressor.  Volume excess: start diuresis  DM: on no meds preop but Hgb A1c was 6.2. Will give a dose of Levemir to get off insulin drip and continue SSI. Will need continued attention to diet and exercise.  Active smoker preop: work on IS. Added Xopenex as needed for wheezing.  DC chest tubes, arterial line.  OOB, ambulate   LOS: 8 days    Alleen Borne 06/17/2020

## 2020-06-18 ENCOUNTER — Inpatient Hospital Stay (HOSPITAL_COMMUNITY): Payer: 59

## 2020-06-18 LAB — BASIC METABOLIC PANEL
Anion gap: 10 (ref 5–15)
BUN: 13 mg/dL (ref 6–20)
CO2: 29 mmol/L (ref 22–32)
Calcium: 9 mg/dL (ref 8.9–10.3)
Chloride: 95 mmol/L — ABNORMAL LOW (ref 98–111)
Creatinine, Ser: 0.8 mg/dL (ref 0.61–1.24)
GFR calc Af Amer: >60 mL/min (ref >60)
GFR calc non Af Amer: >60 mL/min (ref >60)
Glucose, Bld: 119 mg/dL — ABNORMAL HIGH (ref 70–99)
Potassium: 4.3 mmol/L (ref 3.5–5.1)
Sodium: 134 mmol/L — ABNORMAL LOW (ref 135–145)

## 2020-06-18 LAB — CBC
HCT: 47.4 % (ref 39.0–52.0)
Hemoglobin: 15.4 g/dL (ref 13.0–17.0)
MCH: 29.1 pg (ref 26.0–34.0)
MCHC: 32.5 g/dL (ref 30.0–36.0)
MCV: 89.4 fL (ref 80.0–100.0)
Platelets: 243 10*3/uL (ref 150–400)
RBC: 5.3 MIL/uL (ref 4.22–5.81)
RDW: 13.9 % (ref 11.5–15.5)
WBC: 15.6 10*3/uL — ABNORMAL HIGH (ref 4.0–10.5)
nRBC: 0 % (ref 0.0–0.2)

## 2020-06-18 LAB — GLUCOSE, CAPILLARY
Glucose-Capillary: 115 mg/dL — ABNORMAL HIGH (ref 70–99)
Glucose-Capillary: 130 mg/dL — ABNORMAL HIGH (ref 70–99)
Glucose-Capillary: 87 mg/dL (ref 70–99)

## 2020-06-18 MED ORDER — POTASSIUM CHLORIDE CRYS ER 20 MEQ PO TBCR
20.0000 meq | EXTENDED_RELEASE_TABLET | Freq: Every day | ORAL | Status: DC
  Administered 2020-06-18 – 2020-06-19 (×2): 20 meq via ORAL
  Filled 2020-06-18 (×2): qty 1

## 2020-06-18 MED ORDER — ASPIRIN EC 325 MG PO TBEC
325.0000 mg | DELAYED_RELEASE_TABLET | Freq: Every day | ORAL | Status: DC
  Administered 2020-06-19 – 2020-06-21 (×3): 325 mg via ORAL
  Filled 2020-06-18 (×3): qty 1

## 2020-06-18 MED ORDER — ~~LOC~~ CARDIAC SURGERY, PATIENT & FAMILY EDUCATION: Freq: Once | Status: AC

## 2020-06-18 MED ORDER — PANTOPRAZOLE SODIUM 40 MG PO TBEC
40.0000 mg | DELAYED_RELEASE_TABLET | Freq: Every day | ORAL | Status: DC
  Administered 2020-06-19 – 2020-06-21 (×3): 40 mg via ORAL
  Filled 2020-06-18 (×3): qty 1

## 2020-06-18 MED ORDER — METOPROLOL TARTRATE 12.5 MG HALF TABLET
12.5000 mg | ORAL_TABLET | Freq: Two times a day (BID) | ORAL | Status: DC
  Administered 2020-06-18: 12.5 mg via ORAL
  Filled 2020-06-18: qty 1

## 2020-06-18 MED ORDER — BISACODYL 5 MG PO TBEC: 10.0000 mg | DELAYED_RELEASE_TABLET | Freq: Every day | ORAL | Status: DC | PRN

## 2020-06-18 MED ORDER — BISACODYL 10 MG RE SUPP: 10.0000 mg | Freq: Every day | RECTAL | Status: DC | PRN

## 2020-06-18 MED ORDER — SODIUM CHLORIDE 0.9 % IV SOLN: 250.0000 mL | INTRAVENOUS | Status: DC | PRN

## 2020-06-18 MED ORDER — ONDANSETRON HCL 4 MG/2ML IJ SOLN: 4.0000 mg | Freq: Four times a day (QID) | INTRAMUSCULAR | Status: DC | PRN

## 2020-06-18 MED ORDER — DOCUSATE SODIUM 100 MG PO CAPS
200.0000 mg | ORAL_CAPSULE | Freq: Every day | ORAL | Status: DC
  Administered 2020-06-19 – 2020-06-21 (×2): 200 mg via ORAL
  Filled 2020-06-18 (×3): qty 2

## 2020-06-18 MED ORDER — TRAMADOL HCL 50 MG PO TABS
50.0000 mg | ORAL_TABLET | Freq: Four times a day (QID) | ORAL | Status: DC | PRN
  Administered 2020-06-18 – 2020-06-20 (×4): 50 mg via ORAL
  Filled 2020-06-18 (×4): qty 1

## 2020-06-18 MED ORDER — SODIUM CHLORIDE 0.9% FLUSH: 3.0000 mL | INTRAVENOUS | Status: DC | PRN

## 2020-06-18 MED ORDER — SODIUM CHLORIDE 0.9% FLUSH
3.0000 mL | Freq: Two times a day (BID) | INTRAVENOUS | Status: DC
  Administered 2020-06-18 – 2020-06-21 (×6): 3 mL via INTRAVENOUS

## 2020-06-18 MED ORDER — ONDANSETRON HCL 4 MG PO TABS: 4.0000 mg | ORAL_TABLET | Freq: Four times a day (QID) | ORAL | Status: DC | PRN

## 2020-06-18 MED ORDER — FUROSEMIDE 40 MG PO TABS
40.0000 mg | ORAL_TABLET | Freq: Every day | ORAL | Status: DC
  Administered 2020-06-18 – 2020-06-19 (×2): 40 mg via ORAL
  Filled 2020-06-18 (×2): qty 1

## 2020-06-18 MED ORDER — ACETAMINOPHEN 325 MG PO TABS
650.0000 mg | ORAL_TABLET | Freq: Four times a day (QID) | ORAL | Status: DC | PRN
  Administered 2020-06-19: 650 mg via ORAL
  Filled 2020-06-18: qty 2

## 2020-06-18 NOTE — Progress Notes (Signed)
2 Days Post-Op Procedure(s) (LRB): CORONARY ARTERY BYPASS GRAFTING (CABG) LIMA to LAD. (N/A) TRANSESOPHAGEAL ECHOCARDIOGRAM (TEE) (N/A) Subjective:  No complaints. Ambulated this am.  Objective: Vital signs in last 24 hours: Temp:  [98.2 F (36.8 C)-100.6 F (38.1 C)] 99 F (37.2 C) (06/20 0600) Pulse Rate:  [73-96] 96 (06/20 0800) Cardiac Rhythm: Normal sinus rhythm (06/20 0800) Resp:  [12-20] 14 (06/20 0800) BP: (121-156)/(72-97) 143/90 (06/20 0800) SpO2:  [91 %-98 %] 94 % (06/20 0800) Arterial Line BP: (133)/(75) 133/75 (06/19 1000)  Hemodynamic parameters for last 24 hours:    Intake/Output from previous day: 06/19 0701 - 06/20 0700 In: 396.1 [I.V.:296.1; IV Piggyback:100] Out: 2690 [Urine:2630; Chest Tube:60] Intake/Output this shift: Total I/O In: 0  Out: 100 [Urine:100]  General appearance: alert and cooperative Neurologic: intact Heart: regular rate and rhythm, S1, S2 normal, no murmur or rub Lungs: clear to auscultation bilaterally Extremities:  no edema Wound: dressing dry  Lab Results: Recent Labs    06/17/20 1727 06/18/20 0352  WBC 14.9* 15.6*  HGB 15.3 15.4  HCT 47.9 47.4  PLT 249 243   BMET:  Recent Labs    06/17/20 1727 06/18/20 0352  NA 134* 134*  K 4.1 4.3  CL 97* 95*  CO2 28 29  GLUCOSE 135* 119*  BUN 14 13  CREATININE 0.95 0.80  CALCIUM 9.0 9.0    PT/INR:  Recent Labs    06/16/20 1208  LABPROT 14.2  INR 1.1   ABG    Component Value Date/Time   PHART 7.339 (L) 06/16/2020 1748   HCO3 26.5 06/16/2020 1748   TCO2 28 06/16/2020 1748   ACIDBASEDEF 1.0 06/16/2020 1211   O2SAT 97.0 06/16/2020 1748   CBG (last 3)  Recent Labs    06/17/20 2016 06/17/20 2312 06/18/20 0341  GLUCAP 124* 98 115*   CXR: poor quality rotated film. Mild bibasilar atelectasis.  Assessment/Plan: S/P Procedure(s) (LRB): CORONARY ARTERY BYPASS GRAFTING (CABG) LIMA to LAD. (N/A) TRANSESOPHAGEAL ECHOCARDIOGRAM (TEE) (N/A)  POD  2 Hemodynamically stable in sinus rhythm. Continue Lopressor.  Mild volume excess: continue diuresis.  DC central line and foley.  Mild DM: preop Hgb A1c was 6.2 on no meds. Glucose has been fine postop. Will need outpt attention to diet, exercise, medical follow up.  Transfer to 4E and continue mobilization.   LOS: 9 days    Alleen Borne 06/18/2020

## 2020-06-19 ENCOUNTER — Encounter (HOSPITAL_COMMUNITY): Payer: Self-pay | Admitting: Cardiothoracic Surgery

## 2020-06-19 LAB — GLUCOSE, CAPILLARY: Glucose-Capillary: 94 mg/dL (ref 70–99)

## 2020-06-19 MED ORDER — CARVEDILOL 6.25 MG PO TABS
6.2500 mg | ORAL_TABLET | Freq: Two times a day (BID) | ORAL | Status: DC
  Administered 2020-06-19 – 2020-06-21 (×5): 6.25 mg via ORAL
  Filled 2020-06-19 (×5): qty 1

## 2020-06-19 NOTE — Progress Notes (Signed)
CARDIAC REHAB PHASE I   PRE:  Rate/Rhythm: 94 SR    BP: sitting 137/95    SaO2: 93 RA  MODE:  Ambulation: 430 ft   POST:  Rate/Rhythm: 108 ST    BP: sitting 161/98     SaO2: 95 RA  Pt moving slowly but independent. More confidence and speed toward end of walk. VSS. Discussed IS, sternal precautions, exercise, smoking cessation, diet and CRPII. Pt receptive, sts he will never smoke again. Will refer to [facility] CRPII as pt is moving in August. 1010-1100   Harriet Masson CES, ACSM 06/19/2020 10:57 AM

## 2020-06-19 NOTE — Progress Notes (Addendum)
3 Days Post-Op Procedure(s) (LRB): CORONARY ARTERY BYPASS GRAFTING (CABG) LIMA to LAD. (N/A) TRANSESOPHAGEAL ECHOCARDIOGRAM (TEE) (N/A) Subjective: Sitting up in bed waiting for breakfast. Overall feels he is progressing but still having a lot of soreness with movement.  On RA with adequate O2 sats.  BM yesterday.   Objective: Vital signs in last 24 hours: Temp:  [98.2 F (36.8 C)-99.7 F (37.6 C)] 98.4 F (36.9 C) (06/21 0708) Pulse Rate:  [78-106] 95 (06/21 0708) Cardiac Rhythm: Normal sinus rhythm (06/21 0708) Resp:  [0-22] 19 (06/21 0708) BP: (135-154)/(84-99) 154/99 (06/21 0708) SpO2:  [93 %-98 %] 95 % (06/21 0708) Weight:  [97.7 kg-99.8 kg] 97.7 kg (06/21 0515)     Intake/Output from previous day: 06/20 0701 - 06/21 0700 In: 423 [P.O.:420; I.V.:3] Out: 1225 [Urine:1225] Intake/Output this shift: No intake/output data recorded.  General appearance: alert, cooperative and no distress Neurologic: intact Heart: regular rate and rhythm Lungs: Breath sounds are clear.  Abdomen: soft, NT.  Extremities: Well perfused with no significant edema. Wound: The sternal incision is covered with an Aquacel dressing with a few areas of staining.   Lab Results: Recent Labs    06/17/20 1727 06/18/20 0352  WBC 14.9* 15.6*  HGB 15.3 15.4  HCT 47.9 47.4  PLT 249 243   BMET:  Recent Labs    06/17/20 1727 06/18/20 0352  NA 134* 134*  K 4.1 4.3  CL 97* 95*  CO2 28 29  GLUCOSE 135* 119*  BUN 14 13  CREATININE 0.95 0.80  CALCIUM 9.0 9.0    PT/INR:  Recent Labs    06/16/20 1208  LABPROT 14.2  INR 1.1   ABG    Component Value Date/Time   PHART 7.339 (L) 06/16/2020 1748   HCO3 26.5 06/16/2020 1748   TCO2 28 06/16/2020 1748   ACIDBASEDEF 1.0 06/16/2020 1211   O2SAT 97.0 06/16/2020 1748   CBG (last 3)  Recent Labs    06/18/20 0341 06/18/20 0653 06/18/20 1154  GLUCAP 115* 130* 87    Assessment/Plan: S/P Procedure(s) (LRB): CORONARY ARTERY BYPASS GRAFTING  (CABG) LIMA to LAD. (N/A) TRANSESOPHAGEAL ECHOCARDIOGRAM (TEE) (N/A)  -POD1 off-pump single vessel CABG with LIMA->LAD presenting with ACS related to restenosis of previous LAD stent.  EF 55-60%. Continue ASA, statin.  Continue working on ambulation and pulmonary hygiene.   -Hypertension-  SBP 135-155 past 24 hours.  On coreg, lisinopril  and amlodipine prior to admission. Will resume coreg today and monitor.   -DVT PPX- continue enoxaparin.      LOS: 10 days    Leary Roca ., PA-C [phone] 06/19/2020  ambulating , increasing BP meds  Poss home 1-2 days  I have seen and examined [name] and agree with the above assessment  and plan.  Delight Ovens MD Beeper [phone] Office [phone] 06/19/2020 10:11 PM

## 2020-06-19 NOTE — Op Note (Signed)
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  SURGICAL PROCEDURE:  Off pump coronary artery bypass grafting with left internal mammary to the left anterior descending coronary artery.  SURGEON:  Lanelle Bal, MD  FIRST ASSISTANT:  Enid Cutter, PA  BRIEF HISTORY:  The patient is a 39 year old male with previous history of coronary artery disease, having presented 4 years previously with acute anterior infarction.  At that time, Dr. Tamala Julian placed a proximal LAD stent.  The patient did well on  antiplatelet therapy until 2019 when he presented with recurrent angina and had restenosis of the area.  At that time, Dr. Gwenlyn Found restented of the proximal LAD.  The patient presents at this admission with unstable anginal symptoms.  He woke the night of  admission from sleep with new onset of chest pain, was admitted, and underwent repeat cardiac catheterization.  The patient had been maintained on Brilinta including on the day he was seen as a preoperative consult.  Dr. Burt Knack did repeat cardiac  catheterization and referred the patient for consideration of coronary artery bypass grafting to the LAD.  At the time of catheterization, the right coronary artery and circumflex were without significant disease.  Overall, ventricular function was  preserved.  The patient had very mild elevation of hemoglobin A1c.  Risks and options of surgery versus repeat stenting were discussed with the patient in detail and he was agreeable with proceeding with recommended coronary artery bypass grafting.  DESCRIPTION OF PROCEDURE:  With central line and arterial line in place, the  patient underwent general endotracheal anesthesia without incident.  The skin of the chest and legs was prepped with Betadine, draped in the usual sterile manner.  Dr. Gifford Shave  placed a TEE probe and noted preserved LV function without significant valvular disease.  Appropriate timeout was then performed.  Median sternotomy was done.  Mid left internal mammary artery was dissected down as a pedicle graft.  The distal artery was  divided and had good free flow.  The vessel was hydrostatically dilated with heparinized saline.  The pericardium was opened.  Pericardial cradle was created.  The LAD was on the surface of the vessel and was in a position reasonable for off-pump  coronary artery bypass grafting.  The patient was systemically heparinized.  Using suction cup on the apex of the heart, the heart was retracted over so we could easily see the LAD.  A stabilizing foot was positioned.  Elastic tapes were placed  proximally and distally around the LAD.  With the ACT at a suitable level, the LAD was opened and elastic tapes were secured.  Using [employer], we were able to identify the LAD well.  The left internal mammary artery was then anastomosed to the left  anterior descending coronary artery with a running 8-0 Prolene.  Prior to complete closure, the mammary was flushed, anastomosis completed, and the elastic tapes released.  The fascia of the mammary artery was placed, tacked to the epicardium.  There was  an easily discernible Doppler flow in the mammary.  The heart was allowed to drop back down into the pericardial space as the retractors were removed.  Atrial and ventricular pacing wires were applied.  ACT was rechecked and the patient was  given a dose  of protamine.  With the operative field the pericardium was loosely reapproximated, less pleural tube and a Blake mediastinal drain were left in place.  Sternum was closed with #6 stainless steel wire.  Fascia closed with interrupted 0 Vicryl, running   3-0 Vicryl subcutaneous tissue, 4-0 subcuticular stitch in skin edges.  Dry dressings were applied.  Sponge and needle count was reported as correct at completion of procedure.  The patient tolerated the procedure without obvious complication and was  transferred to the surgical intensive care unit for further postoperative care.  CN/NUANCE  D:06/19/2020 T:06/19/2020 JOB:[id number]

## 2020-06-19 NOTE — Progress Notes (Signed)
Mobility Specialist - Progress Note   06/19/20 1628  Mobility  Activity Ambulated in hall  Level of Assistance Independent  Assistive Device None  Distance Ambulated (ft) 450 ft  Mobility Response Tolerated well  Mobility performed by Mobility specialist  $Mobility charge 1 Mobility    Pre-mobility: 85 HR, 126/90 BP, 96% SpO2 During mobility: 90 HR, 97% SpO2 Post-mobility: 76 HR, 146/88 BP, 94% SPO2   Mica Releford Web designer Phone: [phone]

## 2020-06-19 NOTE — Progress Notes (Signed)
Order received this afternoon to remove pacing wires, EPW pulled per protocol and as ordered. All ends intact. Pt BP 151/98 heart rate 85 on monitor. Patient reminded to lie supine approximately one hour. Will monitor patient. Antoinett Dorman, Randall An RN

## 2020-06-20 ENCOUNTER — Inpatient Hospital Stay (HOSPITAL_COMMUNITY): Payer: 59

## 2020-06-20 LAB — BASIC METABOLIC PANEL
Anion gap: 13 (ref 5–15)
BUN: 15 mg/dL (ref 6–20)
CO2: 30 mmol/L (ref 22–32)
Calcium: 9.2 mg/dL (ref 8.9–10.3)
Chloride: 94 mmol/L — ABNORMAL LOW (ref 98–111)
Creatinine, Ser: 1.06 mg/dL (ref 0.61–1.24)
GFR calc Af Amer: >60 mL/min (ref >60)
GFR calc non Af Amer: >60 mL/min (ref >60)
Glucose, Bld: 127 mg/dL — ABNORMAL HIGH (ref 70–99)
Potassium: 3.7 mmol/L (ref 3.5–5.1)
Sodium: 137 mmol/L (ref 135–145)

## 2020-06-20 LAB — CBC
HCT: 46.4 % (ref 39.0–52.0)
Hemoglobin: 15.3 g/dL (ref 13.0–17.0)
MCH: 28.8 pg (ref 26.0–34.0)
MCHC: 33 g/dL (ref 30.0–36.0)
MCV: 87.2 fL (ref 80.0–100.0)
Platelets: 290 10*3/uL (ref 150–400)
RBC: 5.32 MIL/uL (ref 4.22–5.81)
RDW: 13.7 % (ref 11.5–15.5)
WBC: 11.7 10*3/uL — ABNORMAL HIGH (ref 4.0–10.5)
nRBC: 0 % (ref 0.0–0.2)

## 2020-06-20 MED ORDER — LISINOPRIL 10 MG PO TABS
10.0000 mg | ORAL_TABLET | Freq: Every day | ORAL | Status: DC
  Administered 2020-06-20 – 2020-06-21 (×2): 10 mg via ORAL
  Filled 2020-06-20 (×2): qty 1

## 2020-06-20 MED FILL — Heparin Sodium (Porcine) Inj 1000 Unit/ML: INTRAMUSCULAR | Qty: 30 | Status: AC

## 2020-06-20 MED FILL — Potassium Chloride Inj 2 mEq/ML: INTRAVENOUS | Qty: 40 | Status: AC

## 2020-06-20 MED FILL — Magnesium Sulfate Inj 50%: INTRAMUSCULAR | Qty: 10 | Status: AC

## 2020-06-20 NOTE — Discharge Summary (Signed)
Physician Discharge Summary  Patient ID: Mike Frazier MRN: [id number] DOB/AGE: 41/06/1981 [age] y.o.  Admit date: 06/09/2020 Discharge date: 06/21/2020  Admission Diagnoses:  Acute NSTEMI Coronary Artery Disease  Discharge Diagnoses:  Principal Problem:   NSTEMI (non-ST elevated myocardial infarction) (HCC) Active Problems:   Essential hypertension   Hyperlipidemia with target LDL less than 70   Chest pain   S/P CABG x 1   Discharged Condition: stable  History of Present Illness:    Mike Frazier Kinn 39 y.o. male is seen in the Cath Lab holding area  today for evaluation of possible coronary artery bypass graft.  The patient originally presented with acute coronary symptoms in 2017.  At that time DES stent was placed by Dr. Katrinka BlazingSmith in the proximal LAD.  The patient was not known to be diabetic, he did smoke but quit at that time in 2017.  He presented again in December 2020 with similar symptoms, cath by Dr. Gery PrayBarry restenosis of the stent was noted and repeat angioplasty stent placement was done.  The patient has been maintained on Brilinta.  3 days ago he presented [facility] with pain in his chest radiating to his left arm woke him from sleep during the night.  He was admitted , notes by the time he was seen in the emergency room his pain had resolved.  He was admitted and underwent cardiac catheterization today by Dr. Excell Seltzerooper.  Hospital Course:  Coronary bypass grafting was offered to the patient and he decided to proceed with surgery.  After sufficient time was allowed for Brilinta washout, he was prepared and taken to the operating room on 06/16/2020 where single-vessel, off-pump, coronary bypass grafting with the left internal mammary artery grafted to the left anterior descending coronary artery was accomplished without complication.  Following the procedure, he separated from cardiopulmonary bypass without any difficulty.  He was transferred to the cardiovascular ICU in  stable condition.  He remained hemodynamically stable.  He was weaned from the ventilator and extubated routinely.  He was mobilized on the first postoperative day and made excellent progress with his mobility.  Diet and activity were advanced over the next few days.  He maintained a stable sinus rhythm.  He was transferred to 4 E., progressive care, where his progress continued.  By the fifth postoperative day, he was ready for discharge home.  At that time, he was alert and oriented and tolerating a solid diet without any difficulty.  He was having appropriate bowel bladder function.  He was ambulating independently.  He was maintaining adequate oxygen saturation on room air.  His incisions were healing with no sign of complication. He tended to be hypertensive on his usual Coreg and lisinopril so his amlodipine will be resumed at discharge.    Consults: None  Significant Diagnostic Studies:   LEFT HEART CATH AND CORONARY ANGIOGRAPHY  Conclusion  1. Severe single vessel CAD with severe diffuse in-stent restenosis of the proximal LAD, including the proximal and distal stent margins 2. Mild nonobstructive RCA stenosis 3. Patent left main/left circumflex 4. Elevated LVEDP recorded at 27 mmHg but visually appears approximately 20 mmHg  Recommend: TCTS consult for consideration of LIMA-LAD grafting. If patient declines CABG would offer PCI of the LAD, but I think long-term patency rates would be low and he would be at high-risk of recurrent LAD-territory ischemia.    Coronary Diagrams  Diagnostic Dominance: Right       ECHOCARDIOGRAM REPORT  Patient Name:  Mike Frazier Date of Exam: 06/10/2020  Medical Rec #: [id number]    Height:    68.0 in  Accession #:  [id number]    Weight:    229.5 lb  Date of Birth: 1981-05-06     BSA:     2.167 m  Patient Age:  39 years     BP:      132/78 mmHg  Patient Gender: M        HR:      62  bpm.  Exam Location: Inpatient   Procedure: 2D Echo   Indications:  NSTEMI I21.4    History:    Patient has prior history of Echocardiogram examinations,  most         recent 09/30/2016. Previous Myocardial Infarction and CAD,         Signs/Symptoms:Chest Pain; Risk Factors:Hypertension,  Former         Smoker and Dyslipidemia.    Sonographer:  Jeryl Columbia  Referring Phys: 100 DAYNA N DUNN   IMPRESSIONS    1. Left ventricular ejection fraction, by estimation, is 55 to 60%. The  left ventricle has normal function. The left ventricle has no regional  wall motion abnormalities. There is mild left ventricular hypertrophy.  Left ventricular diastolic parameters  were normal.  2. Right ventricular systolic function is normal. The right ventricular  size is normal.  3. The mitral valve is normal in structure. No evidence of mitral valve  regurgitation. No evidence of mitral stenosis.  4. The aortic valve is tricuspid. Aortic valve regurgitation is not  visualized. No aortic stenosis is present.  5. The inferior vena cava is normal in size with greater than 50%  respiratory variability, suggesting right atrial pressure of 3 mmHg.   FINDINGS  Left Ventricle: Left ventricular ejection fraction, by estimation, is 55  to 60%. The left ventricle has normal function. The left ventricle has no  regional wall motion abnormalities. The left ventricular internal cavity  size was normal in size. There is  mild left ventricular hypertrophy. Left ventricular diastolic parameters  were normal.   Right Ventricle: The right ventricular size is normal. No increase in  right ventricular wall thickness. Right ventricular systolic function is  normal.   Left Atrium: Left atrial size was normal in size.   Right Atrium: Right atrial size was normal in size.   Pericardium: There is no evidence of pericardial effusion.   Mitral Valve: The mitral  valve is normal in structure. Normal mobility of  the mitral valve leaflets. No evidence of mitral valve regurgitation. No  evidence of mitral valve stenosis.   Tricuspid Valve: The tricuspid valve is normal in structure. Tricuspid  valve regurgitation is not demonstrated. No evidence of tricuspid  stenosis.   Aortic Valve: The aortic valve is tricuspid. Aortic valve regurgitation is  not visualized. No aortic stenosis is present.   Pulmonic Valve: The pulmonic valve was normal in structure. Pulmonic valve  regurgitation is not visualized. No evidence of pulmonic stenosis.   Aorta: The aortic root is normal in size and structure.   Venous: The inferior vena cava is normal in size with greater than 50%  respiratory variability, suggesting right atrial pressure of 3 mmHg.   IAS/Shunts: No atrial level shunt detected by color flow Doppler.     LEFT VENTRICLE  PLAX 2D  LVIDd:     4.60 cm Diastology  LVIDs:     3.20 cm LV e' lateral:  12.20 cm/s  LV PW:     1.50 cm LV E/e' lateral: 7.6  LV IVS:    1.20 cm LV e' medial:  8.59 cm/s  LVOT diam:   2.00 cm LV E/e' medial: 10.8  LVOT Area:   3.14 cm     RIGHT VENTRICLE  RV S prime:   12.20 cm/s  TAPSE (M-mode): 2.1 cm   LEFT ATRIUM       Index    RIGHT ATRIUM      Index  LA diam:    3.50 cm 1.62 cm/m RA Area:   20.80 cm  LA Vol (A2C):  52.9 ml 24.41 ml/m RA Volume:  71.20 ml 32.86 ml/m  LA Vol (A4C):  33.4 ml 15.41 ml/m  LA Biplane Vol: 42.4 ml 19.57 ml/m    AORTA  Ao Root diam: 3.00 cm   MITRAL VALVE  MV Area (PHT): 2.80 cm  SHUNTS  MV Decel Time: 271 msec  Systemic Diam: 2.00 cm  MV E velocity: 93.00 cm/s  MV A velocity: 51.00 cm/s  MV E/A ratio: 1.82   Charlton Haws MD  Electronically signed by Charlton Haws MD  Signature Date/Time: 06/10/2020/12:18:29 PM    Treatments:   OPERATIVE REPORT  DATE OF PROCEDURE:  06/16/2020  PREOPERATIVE  DIAGNOSIS:  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  SURGICAL PROCEDURE:  Off pump coronary artery bypass grafting with left internal mammary to the left anterior descending coronary artery.  SURGEON:  Sheliah Plane, MD  FIRST ASSISTANT:  Jillyn Hidden, PA  BRIEF HISTORY:  The patient is a 39 year old male with previous history of coronary artery disease, having presented 4 years previously with acute anterior infarction.  At that time, Dr. Katrinka Blazing placed a proximal LAD stent.  The patient did well on  antiplatelet therapy until 2019 when he presented with recurrent angina and had restenosis of the area.  At that time, Dr. Allyson Sabal restented of the proximal LAD.  The patient presents at this admission with unstable anginal symptoms.  He woke the night of  admission from sleep with new onset of chest pain, was admitted, and underwent repeat cardiac catheterization.  The patient had been maintained on Brilinta including on the day he was seen as a preoperative consult.  Dr. Excell Seltzer did repeat cardiac  catheterization and referred the patient for consideration of coronary artery bypass grafting to the LAD.  At the time of catheterization, the right coronary artery and circumflex were without significant disease.  Overall, ventricular function was  preserved.  The patient had very mild elevation of hemoglobin A1c.  Risks and options of surgery versus repeat stenting were discussed with the patient in detail and he was agreeable with proceeding with recommended coronary artery bypass grafting.  Discharge Exam: Blood pressure (!) 146/87, pulse 77, temperature 98.3 F (36.8 C), temperature source Oral, resp. rate 13, height 5\' 8"  (1.727 m), weight 97.6 kg, SpO2 92 %.  General appearance:alert, cooperative and no distress Neurologic:intact Heart:regular rate and rhythm, no arrhythmias on  monitor review. Lungs:Breath sounds are clear. Abdomen:soft, NT. Extremities:Well perfused with no edema. Wound:The sternal incision isintact and dry.  Disposition: Discharged to home in stable condition.  Discharge Instructions    Amb Referral to Cardiac Rehabilitation   Complete by: As directed    Diagnosis: CABG   CABG X ___: 1   After initial evaluation and assessments completed: Virtual Based Care may be provided alone or in  conjunction with Phase 2 Cardiac Rehab based on patient barriers.: Yes     Allergies as of 06/21/2020      Reactions   Codeine Shortness Of Breath      Medication List    STOP taking these medications   ticagrelor 90 MG Tabs tablet Commonly known as: BRILINTA     TAKE these medications   amLODipine 5 MG tablet Commonly known as: NORVASC Take 1 tablet (5 mg total) by mouth daily.   aspirin 81 MG EC tablet Take 1 tablet (81 mg total) by mouth daily.   atorvastatin 80 MG tablet Commonly known as: LIPITOR TAKE 1 TABLET (80 MG TOTAL) BY MOUTH EVERY EVENING.   carvedilol 6.25 MG tablet Commonly known as: COREG Take 1 tablet (6.25 mg total) by mouth 2 (two) times daily with a meal.   FISH OIL PO Take 1 capsule by mouth daily.   lisinopril 20 MG tablet Commonly known as: ZESTRIL Take 1 tablet (20 mg total) by mouth daily.   multivitamin with minerals Tabs tablet Take 1 tablet by mouth daily.   nitroGLYCERIN 0.4 MG SL tablet Commonly known as: NITROSTAT Place 1 tablet (0.4 mg total) under the tongue every 5 (five) minutes as needed for chest pain (up to 3 doses).   traMADol 50 MG tablet Commonly known as: ULTRAM Take 1 tablet (50 mg total) by mouth every 6 (six) hours as needed for up to 7 days for moderate pain.       Follow-up Information    Filbert Schilder, NP. Go on 07/10/2020.   Specialty: Cardiology Why: Your appointment is on Monday, 07/10/20 at 2:15pm Contact information: [address] [place]  [address] [phone]        [facility]. Go on 07/17/2020.   Specialty: Cardiothoracic Surgery Why: You have an appointment at 1pm on 07/17/20. Please arrive 30 minutes early for a chest X-ray to be performed by [facility] located on the first floor of the same building.  Contact information: [address] [place], [address] [place] [address] [phone]       [facility] [place]. Go on 06/28/2020.   Specialty: Cardiothoracic Surgery Why: You have an appointment on Wednesday. 06/28/20 at 11am for suture removal.  Contact information: [address] [place], [address] [place] [address] [phone]             The patient has been discharged on:   1.Beta Blocker:  Yes [ x  ]                              No   [   ]                              If No, reason:  2.Ace Inhibitor/ARB: Yes [ x ]                                     No  [    ]                                     If No, reason:  3.Statin:   Yes [  x ]                  No  [   ]                  If No, reason:  4.Ecasa:  Yes  [ x ]                  No   [   ]                  If No, reason:    Signed: Antony Odea, PA-C 06/21/2020, 8:13 AM

## 2020-06-20 NOTE — Discharge Instructions (Signed)

## 2020-06-20 NOTE — Progress Notes (Signed)
CARDIAC REHAB PHASE I   PRE:  Rate/Rhythm: 93 SR  BP:  Supine:   Sitting: 159/96  Standing:    SaO2: 96%RA  MODE:  Ambulation: 1000 ft   POST:  Rate/Rhythm: 95 SR  BP:  Supine:   Sitting:   Standing: 151/98   SaO2: 96%RA [id number] Pt walked 1000 ft with steady gait and tolerated well. No questions re ed done yesterday by Cardiac Rehab. Encouraged to use IS. Pt to get a few more walks today.    Luetta Nutting, RN BSN  06/20/2020 10:28 AM

## 2020-06-20 NOTE — Progress Notes (Addendum)
4 Days Post-Op Procedure(s) (LRB): CORONARY ARTERY BYPASS GRAFTING (CABG) LIMA to LAD. (N/A) TRANSESOPHAGEAL ECHOCARDIOGRAM (TEE) (N/A) Subjective: Awake and alert, no new concerns. Still having a lot of chest soreness.  Progressing with ambulation. Says he doesn't feel like he is ready to return home.   Objective: Vital signs in last 24 hours: Temp:  [98.2 F (36.8 C)-98.6 F (37 C)] 98.3 F (36.8 C) (06/22 0449) Pulse Rate:  [82-101] 83 (06/22 0449) Cardiac Rhythm: Normal sinus rhythm (06/21 2200) Resp:  [14-20] 14 (06/22 0449) BP: (126-151)/(77-98) 132/85 (06/22 0449) SpO2:  [91 %-99 %] 99 % (06/22 0449) Weight:  [97.3 kg] 97.3 kg (06/22 0449)    Intake/Output from previous day: 06/21 0701 - 06/22 0700 In: 120 [P.O.:120] Out: 325 [Urine:325] Intake/Output this shift: No intake/output data recorded.  General appearance: alert, cooperative and no distress Neurologic: intact Heart: regular rate and rhythm, no significant arrhythmias on monitor review.  Lungs: Breath sounds are clear.  Abdomen: soft, NT.  Extremities: Well perfused with no edema. Wound: The sternal incision is intact and dry, pacer wires out.   Lab Results: Recent Labs    06/18/20 0352 06/20/20 0407  WBC 15.6* 11.7*  HGB 15.4 15.3  HCT 47.4 46.4  PLT 243 290   BMET:  Recent Labs    06/18/20 0352 06/20/20 0407  NA 134* 137  K 4.3 3.7  CL 95* 94*  CO2 29 30  GLUCOSE 119* 127*  BUN 13 15  CREATININE 0.80 1.06  CALCIUM 9.0 9.2    PT/INR: No results for input(s): LABPROT, INR in the last 72 hours. ABG    Component Value Date/Time   PHART 7.339 (L) 06/16/2020 1748   HCO3 26.5 06/16/2020 1748   TCO2 28 06/16/2020 1748   ACIDBASEDEF 1.0 06/16/2020 1211   O2SAT 97.0 06/16/2020 1748   CBG (last 3)  Recent Labs    06/18/20 0653 06/18/20 1154 06/19/20 1230  GLUCAP 130* 87 94    Assessment/Plan: S/P Procedure(s) (LRB): CORONARY ARTERY BYPASS GRAFTING (CABG) LIMA to LAD.  (N/A) TRANSESOPHAGEAL ECHOCARDIOGRAM (TEE) (N/A)  -POD4 off-pump single vessel CABG with LIMA->LAD presenting with ACS related to restenosis of previous LAD stent.  EF 55-60%. Continue ASA, statin.  Pacer wires out. He is progressing well but feels he needs one more day in the hospital before returning home.  -Hypertension-  SBP 130's-150 past 24 hours.  On coreg, lisinopril  and amlodipine prior to admission.Coreg resumed yesterday, will add lisinopril today.   -DVT PPX- continue enoxaparin.   -Wt is well below pre-op. Will d/c lasix.    LOS: 11 days    Parke Poisson [phone] 06/20/2020  Plan d/c tomorrow  I have seen and examined Mike Frazier and agree with the above assessment  and plan.  Delight Ovens MD Beeper [phone] Office [phone] 06/20/2020 5:32 PM

## 2020-06-20 NOTE — Progress Notes (Signed)
Mobility Specialist - Progress Note   06/20/20 1315  Mobility  Activity Ambulated in hall  Level of Assistance Independent  Assistive Device None  Distance Ambulated (ft) 1600 ft  Mobility Response Tolerated well  Mobility performed by Mobility specialist  $Mobility charge 1 Mobility    Pre-mobility: 67 HR, 137/90 BP, 100% SpO2 Post-mobility: 57 HR, 150/94 BP, 100% SpO2   Mike Frazier Web designer Phone: [phone]

## 2020-06-20 NOTE — Plan of Care (Signed)
  Problem: Health Behavior/Discharge Planning: Goal: Ability to manage health-related needs will improve Outcome: Progressing   Problem: Clinical Measurements: Goal: Diagnostic test results will improve Outcome: Progressing Goal: Respiratory complications will improve Outcome: Progressing Goal: Cardiovascular complication will be avoided Outcome: Progressing   

## 2020-06-21 MED ORDER — TRAMADOL HCL 50 MG PO TABS: 50.0000 mg | ORAL_TABLET | Freq: Four times a day (QID) | ORAL | 0 refills | Status: AC | PRN

## 2020-06-21 MED FILL — traMADol HCL 50 MG TABS: 50 | 7 days supply | Qty: 30 | Fill #0

## 2020-06-21 MED FILL — Sodium Chloride IV Soln 0.9%: INTRAVENOUS | Qty: 3000 | Status: AC

## 2020-06-21 NOTE — Progress Notes (Signed)
      [address]       [name] [address]             [phone]        5 Days Post-Op Procedure(s) (LRB): CORONARY ARTERY BYPASS GRAFTING (CABG) LIMA to LAD. (N/A) TRANSESOPHAGEAL ECHOCARDIOGRAM (TEE) (N/A) Subjective: Feels good, no c/o.  He wants to return home today.   Objective: Vital signs in last 24 hours: Temp:  [98.3 F (36.8 C)-99 F (37.2 C)] 98.3 F (36.8 C) (06/23 0500) Pulse Rate:  [75-92] 77 (06/23 0500) Cardiac Rhythm: Normal sinus rhythm;Bundle branch block (06/23 0707) Resp:  [13-19] 13 (06/23 0500) BP: (131-152)/(78-94) 146/87 (06/23 0500) SpO2:  [92 %-98 %] 92 % (06/23 0500) Weight:  [97.6 kg] 97.6 kg (06/23 0500)  Hemodynamic parameters for last 24 hours:    Intake/Output from previous day: 06/22 0701 - 06/23 0700 In: 480 [P.O.:480] Out: 360 [Urine:360] Intake/Output this shift: No intake/output data recorded.  General appearance:alert, cooperative and no distress Neurologic:intact Heart:regular rate and rhythm, no arrhythmias on monitor review.  Lungs:Breath sounds are clear. Abdomen:soft, NT. Extremities:Well perfused with no edema. Wound:The sternal incision is intact and dry. Lab Results: Recent Labs    06/20/20 0407  WBC 11.7*  HGB 15.3  HCT 46.4  PLT 290   BMET:  Recent Labs    06/20/20 0407  NA 137  K 3.7  CL 94*  CO2 30  GLUCOSE 127*  BUN 15  CREATININE 1.06  CALCIUM 9.2    PT/INR: No results for input(s): LABPROT, INR in the last 72 hours. ABG    Component Value Date/Time   PHART 7.339 (L) 06/16/2020 1748   HCO3 26.5 06/16/2020 1748   TCO2 28 06/16/2020 1748   ACIDBASEDEF 1.0 06/16/2020 1211   O2SAT 97.0 06/16/2020 1748   CBG (last 3)  Recent Labs    06/18/20 1154 06/19/20 1230  GLUCAP 87 94    Assessment/Plan: S/P Procedure(s) (LRB): CORONARY ARTERY BYPASS GRAFTING (CABG) LIMA to LAD. (N/A) TRANSESOPHAGEAL ECHOCARDIOGRAM (TEE) (N/A)  -POD5 off-pump single vessel CABG  with LIMA->LAD presenting with ACS related to restenosis of previous LAD stent. EF 55-60%. Continue ASA, statin.  He is ready for discharge today. Instructions given.   -Hypertension- Continues to trend high. On his usual Coreg and lisinopril . Will resume his amlodipine at discharge.    LOS: 12 days    Leary Roca, [place] [phone] 06/21/2020

## 2020-06-21 NOTE — Plan of Care (Signed)
Continue to monitor

## 2020-06-21 NOTE — Progress Notes (Signed)
    CARDIOLOGY RECOMMENDATIONS:  Discharge is anticipated in the next 48 hours. Recommendations for medications and follow up: NO change to current medications.  Discharge Medications: Continue medications as they are currently listed in the [facility]. Exceptions to the above:  n/a  Follow Up: The patient's Primary Cardiologist is Lesleigh Noe, MD  Or APP   will see Mike Chard, NP on 7/12 (see d/c summ)    Signed,  Bryan Lemma, MD  12:10 PM 06/21/2020  [facility]

## 2020-06-21 NOTE — Progress Notes (Signed)
Discharge instructions reviewed with patient. Patient had questions about some refills of his medications. Per Joette Catching, [place], pharmacy showed refills. Explained to patient if he had any problems getting refills to call his MD today or in the morning. All questions answered and patient agreed.

## 2020-06-22 ENCOUNTER — Other Ambulatory Visit: Payer: Self-pay | Admitting: Interventional Cardiology

## 2020-06-22 ENCOUNTER — Telehealth: Payer: Self-pay | Admitting: Interventional Cardiology

## 2020-06-22 ENCOUNTER — Telehealth: Payer: Self-pay

## 2020-06-22 MED ORDER — ATORVASTATIN CALCIUM 80 MG PO TABS: 80.0000 mg | ORAL_TABLET | Freq: Every evening | ORAL | 1 refills | Status: DC

## 2020-06-22 MED ORDER — CARVEDILOL 6.25 MG PO TABS: 6.2500 mg | ORAL_TABLET | Freq: Two times a day (BID) | ORAL | 1 refills | Status: DC

## 2020-06-22 MED ORDER — LISINOPRIL 20 MG PO TABS: 20.0000 mg | ORAL_TABLET | Freq: Every day | ORAL | 1 refills | Status: DC

## 2020-06-22 MED ORDER — AMLODIPINE BESYLATE 5 MG PO TABS: 5.0000 mg | ORAL_TABLET | Freq: Every day | ORAL | 1 refills | Status: DC

## 2020-06-22 MED FILL — ATORVASTATIN 80 MG TABLET: 80 | 90 days supply | Qty: 90 | Fill #0

## 2020-06-22 MED FILL — LISINOPRIL 20 MG TABLET: 20 | 90 days supply | Qty: 90 | Fill #0

## 2020-06-22 MED FILL — CARVEDILOL 6.25 MG TABLET: 6.25 | 90 days supply | Qty: 180 | Fill #0

## 2020-06-22 MED FILL — AMLODIPINE BESYLATE 5 MG TA: 5 | 30 days supply | Qty: 30 | Fill #0

## 2020-06-22 NOTE — Telephone Encounter (Signed)
Transition Care Management Follow-up Telephone Call  Date of discharge and from where: 06/21/2020, [facility]  How have you been since you were released from the hospital? He stated that he is feeling okay.  He explained that the hospitalization scared him and he does not to make any mistakes going forward. He wants to make sure that he gets all of his medications and has already contacted the cardiologist to have his medications refilled at [facility] pharmacy.  Any questions or concerns?  concern regarding medications noted above.  Items Reviewed:  Did the pt receive and understand the discharge instructions provided?  yes, he had no questions about the instructions  He said that he has a scale but has to have a friend check it and he understands that he should be weighing himself daily. He explained that he has been trying to keep moving and not remain sedentary.  He also said that he has showered and his surgical sites are healing well with no drainage.   Medications obtained and verified? he said that he only has the tramadol, aspirin. He will need to get multivitamins and fish oil.  He said that he has some nitroglycerin but no refills. He stated that the doctor told him that he shouldn't need it anymore. Informed him that the amlodipine, atorvastatin, carvedilol and lisinopril will be ready for pick up this afternoon at [facility] pharmacy  and he said he would have someone get them for him. No other questions about his medications.   Any new allergies since your discharge?  none reported  Do you have support at home? Yes, his fiance.   Other (ie: DME, Home Health, etc)  No home health or DME ordered.   Functional Questionnaire: (I = Independent and D = Dependent) ADL's: independent   Follow up appointments reviewed:    PCP Hospital f/u appt confirmed?scheduled appt with Dr Earlene Plater - 07/06/2020 @ 1050   Specialist Hospital f/u appt confirmed?CTS- 06/28/2020; cardiology- 07/10/2020  Are  transportation arrangements needed? no  If their condition worsens, is the pt aware to call  their PCP or go to the ED?  yes  Was the patient provided with contact information for the PCP's office or ED? he has the number for [facility], provided him with the number for [facility]  Was the pt encouraged to call back with questions or concerns? yes

## 2020-06-22 NOTE — Telephone Encounter (Addendum)
*  STAT* If patient is at the pharmacy, call can be transferred to refill team.   1. Which medications need to be refilled? (please list name of each medication and dose if known) amLODipine (NORVASC) 5 MG tablet lisinopril (ZESTRIL) 20 MG tablet carvedilol (COREG) 6.25 MG tablet atorvastatin (LIPITOR) 80 MG tablet   2. Which pharmacy/location (including street and city if local pharmacy) is medication to be sent to? [facility] - [place], [place] - [address]  3. Do they need a 30 day or 90 day supply? 30 day PATIENT IS OUT OF MEDS.

## 2020-06-22 NOTE — Telephone Encounter (Signed)
Pt's medications were sent to pt's pharmacy as requested. Confirmation received.  

## 2020-06-28 ENCOUNTER — Encounter (INDEPENDENT_AMBULATORY_CARE_PROVIDER_SITE_OTHER): Payer: Self-pay

## 2020-06-28 ENCOUNTER — Other Ambulatory Visit: Payer: Self-pay

## 2020-06-28 DIAGNOSIS — Z4802 Encounter for removal of sutures: Secondary | ICD-10-CM

## 2020-07-01 NOTE — Progress Notes (Signed)
Cardiology Office Note   Date:  07/10/2020   ID:  Karron Alvizo, DOB [birth year], MRN [id number]  PCP:  Arvilla Market, DO  Cardiologist:  Dr. Katrinka Blazing, MD   Chief Complaint  Patient presents with   Follow-up    History of Present Illness: Mike Frazier is a 39 y.o. male who presents for post CABG follow-up, seen for Dr. Katrinka Blazing.  Mike Frazier has a history of CAD (NSTEMI 2017 w/ DES to LAD, NSTEMI 11/2018 s/p DES to LAD), HTN, HLD, pre-diabetes,formertobaccouse, asthma, obesity, THC use, and episodic noncompliance.  He was previously seen after non-STEMI with LHC in 2017 as above. He was seen in follow-up after that admission and had run out of his medications therefore Brilinta was transitioned to Plavix therapy.  Following this there was another lapse in follow-up when he had run out of his medications for several more months.  He was readmitted 11/2018 with another NSTEMI with minimal troponin elevation.  LHC showed 90% ISR of the proximal LAD treated with DES, LVEDP normal, EF 55 to 65%.  He was placed back on Brilinta.  And follow-up 12/2019 he presented for the evaluation of elevated BP (along with headache, fatigue and nasal congestion) however BP was stable during that evaluation at 130/82.  Follow-up Covid testing was positive.  Long-term Brilinta therapy was discussed with Dr. Katrinka Blazing and given his intermittent noncompliance, Plavix was felt to be more reasonable however patient preferred to stay on Brilinta.  He then presented 06/09/2020 with chest pain.  He was hypertensive on arrival 163/106.  At bedtime troponin trended upward at 8>> 31>> 49 with EKG changes including borderline elevation of ST segments in 1, aVL and V2 seen on previous tracing however more pronounced.  He ultimately underwent LHC which showed severe single-vessel CAD with severe diffuse in-stent restenosis of the proximal LAD, including the proximal and distal stent margins with mild  nonobstructive RCA stenosis and a patent left main as well as LCx.  T CTS was consulted for consideration of LIMA to LAD grafting which he accepted.  After sufficient Brilinta washout time, single-vessel CABG was performed 06/16/2020.  He did well in the postoperative course.  He remained relatively hypertensive on his PTA Coreg and lisinopril therefore amlodipine was added to his regimen.  Discharge medication plan includes amlodipine 5, ASA 81, atorvastatin 80, carvedilol 6.25, lisinopril 20, as needed SL NTG.  Today Mike Frazier returns for post hospital/CABG follow-up and is doing extremely well.  He reports he is walking 30 minutes several times per day without any issue.  He has stopped smoking and has changed his diet.  He is tolerating his medications well.  BP is normalized at 100/60 with home SBP's in the 110 range therefore we discussed discontinuing his amlodipine which he is happy about.  He has follow-up with TCTS 07/17/2020.  He has had no issues with his incision site including redness or drainage.  He reports the worst part of the whole experience for him was sleeping on his back and coughing.  He has only taken 1 dose of pain medication since being discharged.  Overall very proud of him.  Past Medical History:  Diagnosis Date   Asthma    Coronary artery disease    a. NSTEMI  s/p DES stent to LAD in 09/2016. b. NSTEMI s/p DES to LAD in 11/2018, EF 55-65%.   High cholesterol    History of noncompliance with medical treatment, presenting hazards to health  Hypertension    Migraine    "grew out of them; sports related" (12/17/2018)   NSTEMI (non-ST elevated myocardial infarction) (HCC) 08/2016; 12/16/2018   Pre-diabetes    Torsion, testicular     Past Surgical History:  Procedure Laterality Date   CARDIAC CATHETERIZATION N/A 09/30/2016   Procedure: Left Heart Cath and Coronary Angiography;  Surgeon: Lyn Records, MD;  Location: [facility] INVASIVE CV LAB;  Service:  Cardiovascular;  Laterality: N/A;   CARDIAC CATHETERIZATION N/A 09/30/2016   Procedure: Intravascular Pressure Wire/FFR Study;  Surgeon: Lyn Records, MD;  Location: [facility] INVASIVE CV LAB;  Service: Cardiovascular;  Laterality: N/A;   CARDIAC CATHETERIZATION N/A 09/30/2016   Procedure: Coronary Stent Intervention;  Surgeon: Lyn Records, MD;  Location: [facility] INVASIVE CV LAB;  Service: Cardiovascular;  Laterality: N/A;   CORONARY ANGIOPLASTY WITH STENT PLACEMENT  12/17/2018   CORONARY ARTERY BYPASS GRAFT N/A 06/16/2020   Procedure: CORONARY ARTERY BYPASS GRAFTING (CABG) LIMA to LAD.;  Surgeon: Delight Ovens, MD;  Location: [facility] OR;  Service: Open Heart Surgery;  Laterality: N/A;  OFF-PUMP   I & D EXTREMITY  10/21/2012   Procedure: IRRIGATION AND DEBRIDEMENT EXTREMITY;  Surgeon: Marlowe Shores, MD;  Location: [facility] OR;  Service: Orthopedics;  Laterality: Left;   INCISION / DRAINAGE HAND / FINGER Right    "bit by spider twice; once on left thumb; once on right middle finger" (12/17/2018   LEFT HEART CATH AND CORONARY ANGIOGRAPHY N/A 12/17/2018   Procedure: LEFT HEART CATH AND CORONARY ANGIOGRAPHY;  Surgeon: Runell Gess, MD;  Location: [facility] INVASIVE CV LAB;  Service: Cardiovascular;  Laterality: N/A;   LEFT HEART CATH AND CORONARY ANGIOGRAPHY N/A 06/12/2020   Procedure: LEFT HEART CATH AND CORONARY ANGIOGRAPHY;  Surgeon: Tonny Bollman, MD;  Location: [facility] INVASIVE CV LAB;  Service: Cardiovascular;  Laterality: N/A;   TEE WITHOUT CARDIOVERSION N/A 06/16/2020   Procedure: TRANSESOPHAGEAL ECHOCARDIOGRAM (TEE);  Surgeon: Delight Ovens, MD;  Location: [facility] OR;  Service: Open Heart Surgery;  Laterality: N/A;   TESTICLE TORSION REDUCTION  ~ 1997     Current Outpatient Medications  Medication Sig Dispense Refill   aspirin 81 MG EC tablet Take 1 tablet (81 mg total) by mouth daily. 30 tablet 3   atorvastatin (LIPITOR) 80 MG tablet Take 1 tablet (80 mg total) by mouth every evening. 90  tablet 1   carvedilol (COREG) 6.25 MG tablet Take 1 tablet (6.25 mg total) by mouth 2 (two) times daily with a meal. 180 tablet 1   lisinopril (ZESTRIL) 20 MG tablet Take 1 tablet (20 mg total) by mouth daily. 90 tablet 1   Multiple Vitamin (MULTIVITAMIN WITH MINERALS) TABS tablet Take 1 tablet by mouth daily.     nitroGLYCERIN (NITROSTAT) 0.4 MG SL tablet Place 1 tablet (0.4 mg total) under the tongue every 5 (five) minutes as needed for chest pain (up to 3 doses). 25 tablet 3   Omega-3 Fatty Acids (FISH OIL PO) Take 1 capsule by mouth daily.     No current facility-administered medications for this visit.    Allergies:   Codeine    Social History:  The patient  reports that he quit smoking about 18 months ago. His smoking use included cigarettes. He has a 9.20 pack-year smoking history. He has never used smokeless tobacco. He reports current alcohol use. He reports current drug use. Drug: Marijuana.   Family History:  The patient's family history includes Heart attack in his sister; Hypertension in his  maternal grandmother and mother.    ROS:  Please see the history of present illness.  Otherwise, review of systems are positive for none.   All other systems are reviewed and negative.    PHYSICAL EXAM: VS:  BP 100/60    Pulse 77    Ht 5\' 9"  (1.753 m)    Wt 220 lb 6.4 oz (100 kg)    SpO2 98%    BMI 32.55 kg/m  , BMI Body mass index is 32.55 kg/m.   General: Well developed, well nourished, NAD Neck: Negative for carotid bruits. No JVD Lungs:Clear to ausculation bilaterally. No wheezes, rales, or rhonchi. Breathing is unlabored. Cardiovascular: RRR with S1 S2. No murmurs, rubs, gallops, or LV heave appreciated. Mid sternal chest incision, stable with no s/s of redness or drainage.  Extremities: No edema. No clubbing or cyanosis. DP/PT pulses 2+ bilaterally Neuro: Alert and oriented. No focal deficits. No facial asymmetry. MAE spontaneously. Psych: Responds to questions  appropriately with normal affect.     EKG:  EKG is not ordered today.  Recent Labs: 01/20/2020: ALT 16; TSH 0.512 06/17/2020: Magnesium 1.9 06/20/2020: BUN 15; Creatinine, Ser 1.06; Hemoglobin 15.3; Platelets 290; Potassium 3.7; Sodium 137    Lipid Panel    Component Value Date/Time   CHOL 137 01/20/2020 0950   TRIG 105 01/20/2020 0950   HDL 49 01/20/2020 0950   CHOLHDL 2.8 01/20/2020 0950   CHOLHDL 3.1 12/16/2018 1830   VLDL 34 12/16/2018 1830   LDLCALC 69 01/20/2020 0950     Wt Readings from Last 3 Encounters:  07/10/20 220 lb 6.4 oz (100 kg)  06/21/20 215 lb 3.2 oz (97.6 kg)  01/20/20 223 lb 12.8 oz (101.5 kg)     Other studies Reviewed: Additional studies/ records that were reviewed today include:  Review of the above records demonstrates:    LHC 06/16/2020:  LEFT HEART CATH AND CORONARY ANGIOGRAPHY  Conclusion  1. Severe single vessel CAD with severe diffuse in-stent restenosis of the proximal LAD, including the proximal and distal stent margins 2. Mild nonobstructive RCA stenosis 3. Patent left main/left circumflex 4. Elevated LVEDP recorded at 27 mmHg but visually appears approximately 20 mmHg  Recommend: TCTS consult for consideration of LIMA-LAD grafting. If patient declines CABG would offer PCI of the LAD, but I think long-term patency rates would be low and he would be at high-risk of recurrent LAD-territory ischemia.    Coronary Diagrams  Diagnostic Dominance: Right       Echocardiogram 06/10/2020:  1. Left ventricular ejection fraction, by estimation, is 55 to 60%. The  left ventricle has normal function. The left ventricle has no regional  wall motion abnormalities. There is mild left ventricular hypertrophy.  Left ventricular diastolic parameters  were normal.  2. Right ventricular systolic function is normal. The right ventricular  size is normal.  3. The mitral valve is normal in structure. No evidence of mitral valve   regurgitation. No evidence of mitral stenosis.  4. The aortic valve is tricuspid. Aortic valve regurgitation is not  visualized. No aortic stenosis is present.  5. The inferior vena cava is normal in size with greater than 50%  respiratory variability, suggesting right atrial pressure of 3 mmHg.    ASSESSMENT AND PLAN:  1. CAD with single-vessel CABG 06/16/20:  -Underwent single-vessel CABG due to severe diffuse in-stent restenosis of the proximal LAD, including the proximal and distal stent margins with mild nonobstructive RCA stenosis and a patent left main as well as  LCx.  TCTS was consulted for LIMA to LAD CABG performed 06/16/2020 without complication after sufficient Brilinta washout time -Prior history of non-STEMI 2017 with DES to LAD, NSTEMI 11/2018 with DES to LAD -Sternal precautions reviewed, no signs of infection including redness or drainage at sternal incision -Okay to participate cardiac rehabilitation>> currently walking 30-minute sessions several times per day without anginal symptoms -BP stable -Continue ASA 81, atorvastatin 80, carvedilol 6.25  2. HTN: -Stable,  100/60 with home SBP's in the 110 range -Continue carvedilol, discontinue amlodipine  3. HLD: -Last LDL, 96 with a gaol of <70 -Continue high intensity statin -Repeat lipid panel at next office visit  4.  Tobacco use: -Reports cessation>> congratulated   Current medicines are reviewed at length with the patient today.  The patient does not have concerns regarding medicines.  The following changes have been made: Discontinue amlodipine  Labs/ tests ordered today include: None  No orders of the defined types were placed in this encounter.   Disposition:   FU with Dr. Excell Seltzerooper or APP in 6 weeks  Signed, Georgie ChardJill Velvie Thomaston, NP  07/10/2020 2:53 PM    [facility] [address], [place], [place]  [address] Phone: [phone]; Fax: [phone]

## 2020-07-05 ENCOUNTER — Other Ambulatory Visit: Payer: Self-pay | Admitting: Cardiothoracic Surgery

## 2020-07-05 DIAGNOSIS — Z951 Presence of aortocoronary bypass graft: Secondary | ICD-10-CM

## 2020-07-06 ENCOUNTER — Telehealth (INDEPENDENT_AMBULATORY_CARE_PROVIDER_SITE_OTHER): Payer: 59 | Admitting: Internal Medicine

## 2020-07-06 ENCOUNTER — Encounter: Payer: Self-pay | Admitting: Internal Medicine

## 2020-07-06 DIAGNOSIS — Z09 Encounter for follow-up examination after completed treatment for conditions other than malignant neoplasm: Secondary | ICD-10-CM | POA: Diagnosis not present

## 2020-07-06 DIAGNOSIS — Z7689 Persons encountering health services in other specified circumstances: Secondary | ICD-10-CM

## 2020-07-06 DIAGNOSIS — Z951 Presence of aortocoronary bypass graft: Secondary | ICD-10-CM | POA: Diagnosis not present

## 2020-07-06 DIAGNOSIS — I214 Non-ST elevation (NSTEMI) myocardial infarction: Secondary | ICD-10-CM

## 2020-07-06 NOTE — Progress Notes (Signed)
Virtual Visit via Telephone Note  I connected with Mike Frazier, on 07/06/2020 at 10:57 AM by telephone due to the COVID-19 pandemic and verified that I am speaking with the correct person using two identifiers.   Consent: I discussed the limitations, risks, security and privacy concerns of performing an evaluation and management service by telephone and the availability of in person appointments. I also discussed with the patient that there may be a patient responsible charge related to this service. The patient expressed understanding and agreed to proceed.  Location of Patient: Home   Location of Provider: Clinic   Persons participating in Telemedicine visit: Len Nykeem [facility] Dr. Earlene Plater    History of Present Illness: Patient has a visit to establish care. Was hospitalized from 6/11 to 6/23 for NSTEMI and CAD with CABG x1. Reports PMH of HTN and HLD. Sees cardiology for outpatient follow up on 7/12. Has not yet started cardiac rehab but that is planned. No chest pain and has not had to take any pain medications or nitroglycerin. Reports he feels like he is recovering very well. He went to cardiothoracic surgeons office to have his stiches removed.   Hospital Course:  Coronary bypass grafting was offered to the patient and he decided to proceed with surgery.  After sufficient time was allowed for Brilinta washout, he was prepared and taken to the operating room on 06/16/2020 where single-vessel, off-pump, coronary bypass grafting with the left internal mammary artery grafted to the left anterior descending coronary artery was accomplished without complication.  Following the procedure, he separated from cardiopulmonary bypass without any difficulty.  He was transferred to the cardiovascular ICU in stable condition.  He remained hemodynamically stable.  He was weaned from the ventilator and extubated routinely.  He was mobilized on the first postoperative day and made  excellent progress with his mobility.  Diet and activity were advanced over the next few days.  He maintained a stable sinus rhythm.  He was transferred to 4 E., progressive care, where his progress continued.  By the fifth postoperative day, he was ready for discharge home.  At that time, he was alert and oriented and tolerating a solid diet without any difficulty.  He was having appropriate bowel bladder function.  He was ambulating independently.  He was maintaining adequate oxygen saturation on room air.  His incisions were healing with no sign of complication. He tended to be hypertensive on his usual Coreg and lisinopril so his amlodipine will be resumed at discharge.     Past Medical History:  Diagnosis Date  . Asthma   . Coronary artery disease    a. NSTEMI  s/p DES stent to LAD in 09/2016. b. NSTEMI s/p DES to LAD in 11/2018, EF 55-65%.  . High cholesterol   . History of noncompliance with medical treatment, presenting hazards to health   . Hypertension   . Migraine    "grew out of them; sports related" (12/17/2018)  . NSTEMI (non-ST elevated myocardial infarction) (HCC) 08/2016; 12/16/2018  . Pre-diabetes   . Torsion, testicular    Allergies  Allergen Reactions  . Codeine Shortness Of Breath    Current Outpatient Medications on File Prior to Visit  Medication Sig Dispense Refill  . amLODipine (NORVASC) 5 MG tablet Take 1 tablet (5 mg total) by mouth daily. 90 tablet 1  . aspirin 81 MG EC tablet Take 1 tablet (81 mg total) by mouth daily. 30 tablet 3  . atorvastatin (LIPITOR) 80 MG tablet Take  1 tablet (80 mg total) by mouth every evening. 90 tablet 1  . carvedilol (COREG) 6.25 MG tablet Take 1 tablet (6.25 mg total) by mouth 2 (two) times daily with a meal. 180 tablet 1  . lisinopril (ZESTRIL) 20 MG tablet Take 1 tablet (20 mg total) by mouth daily. 90 tablet 1  . Multiple Vitamin (MULTIVITAMIN WITH MINERALS) TABS tablet Take 1 tablet by mouth daily.    . Omega-3 Fatty Acids  (FISH OIL PO) Take 1 capsule by mouth daily.    . nitroGLYCERIN (NITROSTAT) 0.4 MG SL tablet Place 1 tablet (0.4 mg total) under the tongue every 5 (five) minutes as needed for chest pain (up to 3 doses). (Patient not taking: Reported on 07/06/2020) 25 tablet 3   No current facility-administered medications on file prior to visit.    Observations/Objective: NAD. Speaking clearly.  Work of breathing normal.  Alert and oriented. Mood appropriate.   Assessment and Plan: 1. Encounter to establish care Reviewed patient's PMH, social history, surgical history, and medications.   2. Hospital discharge follow-up  3. NSTEMI (non-ST elevated myocardial infarction) (HCC) No further chest pain. Is on ASA therapy, Lipitor, Ace inhibitor, and beta blocker. Follow up with cardiology as scheduled.   4. S/P CABG x 1 Plan for cardiac rehab.    Follow Up Instructions: Annual exam    I discussed the assessment and treatment plan with the patient. The patient was provided an opportunity to ask questions and all were answered. The patient agreed with the plan and demonstrated an understanding of the instructions.   The patient was advised to call back or seek an in-person evaluation if the symptoms worsen or if the condition fails to improve as anticipated.     I provided 22 minutes total of non-face-to-face time during this encounter including median intraservice time, reviewing previous notes, investigations, ordering medications, medical decision making, coordinating care and patient verbalized understanding at the end of the visit.    Marcy Siren, D.O. [facility] at [facility]  07/06/2020, 10:57 AM

## 2020-07-10 ENCOUNTER — Ambulatory Visit (INDEPENDENT_AMBULATORY_CARE_PROVIDER_SITE_OTHER): Payer: 59 | Admitting: Cardiology

## 2020-07-10 ENCOUNTER — Other Ambulatory Visit: Payer: Self-pay

## 2020-07-10 ENCOUNTER — Encounter: Payer: Self-pay | Admitting: Cardiology

## 2020-07-10 VITALS — BP 100/60 | HR 77 | Ht 69.0 in | Wt 220.4 lb

## 2020-07-10 DIAGNOSIS — I10 Essential (primary) hypertension: Secondary | ICD-10-CM | POA: Diagnosis not present

## 2020-07-10 DIAGNOSIS — I2510 Atherosclerotic heart disease of native coronary artery without angina pectoris: Secondary | ICD-10-CM

## 2020-07-10 DIAGNOSIS — Z720 Tobacco use: Secondary | ICD-10-CM

## 2020-07-10 DIAGNOSIS — E785 Hyperlipidemia, unspecified: Secondary | ICD-10-CM | POA: Diagnosis not present

## 2020-07-10 DIAGNOSIS — Z951 Presence of aortocoronary bypass graft: Secondary | ICD-10-CM

## 2020-07-10 NOTE — Patient Instructions (Signed)
Medication Instructions:  Your physician has recommended you make the following change in your medication:  1. STOP AMLODIPINE   *If you need a refill on your cardiac medications before your next appointment, please call your pharmacy*   Lab Work: NONE If you have labs (blood work) drawn today and your tests are completely normal, you will receive your results only by: Marland Kitchen MyChart Message (if you have MyChart) OR . A paper copy in the mail If you have any lab test that is abnormal or we need to change your treatment, we will call you to review the results.   Testing/Procedures: NONE   Follow-Up: At [facility], you and your health needs are our priority.  As part of our continuing mission to provide you with exceptional heart care, we have created designated Provider Care Teams.  These Care Teams include your primary Cardiologist (physician) and Advanced Practice Providers (APPs -  Physician Assistants and Nurse Practitioners) who all work together to provide you with the care you need, when you need it.  We recommend signing up for the patient portal called "MyChart".  Sign up information is provided on this After Visit Summary.  MyChart is used to connect with patients for Virtual Visits (Telemedicine).  Patients are able to view lab/test results, encounter notes, upcoming appointments, etc.  Non-urgent messages can be sent to your provider as well.   To learn more about what you can do with MyChart, go to [email or web address].    Your next appointment:   6 week(s)  The format for your next appointment:   In Person  Provider:   You may see DR. Excell Seltzer or one of the following Advanced Practice Providers on your designated Care Team:    Tereso Newcomer, PA-C  [place], [place]

## 2020-07-17 ENCOUNTER — Other Ambulatory Visit: Payer: Self-pay

## 2020-07-17 ENCOUNTER — Ambulatory Visit
Admission: RE | Admit: 2020-07-17 | Discharge: 2020-07-17 | Disposition: A | Discharge status: Home / Self Care | Payer: 59 | Source: Ambulatory Visit | Attending: Cardiothoracic Surgery | Admitting: Cardiothoracic Surgery

## 2020-07-17 ENCOUNTER — Ambulatory Visit (INDEPENDENT_AMBULATORY_CARE_PROVIDER_SITE_OTHER): Payer: Self-pay | Admitting: Surgical

## 2020-07-17 VITALS — BP 145/94 | HR 71 | Resp 18 | Ht 69.0 in | Wt 219.0 lb

## 2020-07-17 DIAGNOSIS — Z951 Presence of aortocoronary bypass graft: Secondary | ICD-10-CM

## 2020-07-17 NOTE — Assessment & Plan Note (Signed)
Progressing well 1 month post surgery

## 2020-07-17 NOTE — Patient Instructions (Signed)
Routine activity and driving progression

## 2020-07-17 NOTE — Progress Notes (Signed)
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  SURGICAL PROCEDURE:  Off pump coronary artery bypass grafting with left internal mammary to the left anterior descending coronary artery.  SURGEON:  Sheliah Plane, MD  FIRST ASSISTANT:  Jillyn Hidden, PA  History of Present Illness:    Patient is a 39 year old male status post the above described procedure seen in the office on today's date and routine postsurgical follow-up.  He reports that he feels well.  He does have some mild soreness associated with the sternotomy incision.  He denies fevers, chills or other significant constitutional symptoms.  He denies chest pain or shortness of breath.  He is not having any lower extremity edema or palpitations.  He drove to the office on today's day without difficulty.  Overall he is pleased with his progress.      Past Medical History:  Diagnosis Date   Asthma    Coronary artery disease    a. NSTEMI  s/p DES stent to LAD in 09/2016. b. NSTEMI s/p DES to LAD in 11/2018, EF 55-65%.   High cholesterol    History of noncompliance with medical treatment, presenting hazards to health    Hypertension    Migraine    "grew out of them; sports related" (12/17/2018)   NSTEMI (non-ST elevated myocardial infarction) (HCC) 08/2016; 12/16/2018   Pre-diabetes    Torsion, testicular      Social History   Tobacco Use  Smoking Status Former Smoker   Packs/day: 0.40   Years:  23.00   Pack years: 9.20   Types: Cigarettes   Quit date: 12/17/2018   Years since quitting: 1.5  Smokeless Tobacco Never Used    Social History   Substance and Sexual Activity  Alcohol Use Yes   Comment: 12/17/2018 "maybe 6 beer/smonth; if that"     Allergies  Allergen Reactions   Codeine Shortness Of Breath    Current Outpatient Medications  Medication Sig Dispense Refill   aspirin 81 MG EC tablet Take 1 tablet (81 mg total) by mouth daily. 30 tablet 3   atorvastatin (LIPITOR) 80 MG tablet Take 1 tablet (80 mg total) by mouth every evening. 90 tablet 1   carvedilol (COREG) 6.25 MG tablet Take 1 tablet (6.25 mg total) by mouth 2 (two) times daily with a meal. 180 tablet 1   lisinopril (ZESTRIL) 20 MG tablet Take 1 tablet (20 mg total) by mouth daily. 90 tablet 1   Multiple Vitamin (MULTIVITAMIN WITH MINERALS) TABS tablet Take 1 tablet by mouth daily.     nitroGLYCERIN (NITROSTAT) 0.4 MG SL tablet Place 1 tablet (0.4 mg total) under the tongue every 5 (five) minutes as needed for chest pain (up to 3 doses). 25 tablet 3   Omega-3 Fatty Acids (FISH OIL PO) Take 1 capsule by mouth daily.     No current facility-administered medications for this visit.       Physical Exam:  BP (!) 145/94 (BP Location: Right Arm, Patient Position: Sitting, Cuff Size: Normal)    Pulse 71    Resp 18    Ht 5\' 9"  (1.753 m)    Wt 219 lb (99.3 kg)    SpO2 98% Comment: RA   BMI 32.34 kg/m   General appearance: alert, cooperative and no distress Heart: regular rate and rhythm Lungs: clear to auscultation bilaterally Abdomen: Benign exam Extremities: No edema Wound: well healed   Diagnostic Studies & Laboratory data:     Recent Radiology Findings:   DG Chest 2 View  Result Date: 07/17/2020 CLINICAL DATA:  Status post coronary bypass grafting EXAM: CHEST - 2 VIEW COMPARISON:  06/20/2020 FINDINGS: Postsurgical changes are again seen. Cardiac shadow is stable. The lungs are clear  bilaterally. Previously seen basilar atelectatic changes have resolved. No bony abnormality is noted. IMPRESSION: No acute abnormality seen. Electronically Signed   By: 06/22/2020 M.D.   On: 07/17/2020 13:08      Recent Lab Findings: Lab Results  Component Value Date   WBC 11.7 (H) 06/20/2020   HGB 15.3 06/20/2020   HCT 46.4 06/20/2020   PLT 290 06/20/2020   GLUCOSE 127 (H) 06/20/2020   CHOL 137 01/20/2020   TRIG 105 01/20/2020   HDL 49 01/20/2020   LDLCALC 69 01/20/2020   ALT 16 01/20/2020   AST 16 01/20/2020   NA 137 06/20/2020   K 3.7 06/20/2020   CL 94 (L) 06/20/2020   CREATININE 1.06 06/20/2020   BUN 15 06/20/2020   CO2 30 06/20/2020   TSH 0.512 01/20/2020   INR 1.1 06/16/2020   HGBA1C 6.2 (H) 06/15/2020      Assessment / Plan: Patient is doing quite well.  There are no specific cardiothoracic surgical issues.  He will continue his long-term follow-up with cardiology.  I instructed him on advancement in routine activities and driving progressions per protocols.  We will see him again on a as needed basis.  I did not make any changes to his current medication regimen.      Medication Changes: No orders of the defined types were placed in this encounter.     06/17/2020, PA-C 07/17/2020 1:28 PM

## 2020-08-21 ENCOUNTER — Ambulatory Visit: Payer: 59 | Admitting: Cardiovascular Disease

## 2020-08-25 NOTE — Progress Notes (Signed)
Cardiology Office Note:    Date:  08/30/2020   ID:  Mike Frazier, DOB 1981-07-18, MRN [id number]  PCP:  Arvilla Market, DO  Cardiologist:  Lesleigh Noe, MD   Referring MD: Leary Roca*   Chief Complaint  Patient presents with  . Coronary Artery Disease    History of Present Illness:    Mike Frazier is a 39 y.o. male with a hx of CAD (NSTEMI 2017 w/ DES to LAD, NSTEMI 11/2018 s/p DES to LAD), HTN, HLD, pre-diabetes,formertobaccouse, asthma, obesity, THC use, and episodic noncompliance. Now s/p CABG 06/16/2020 for LAD recurrent restenosis.  Underwent successful LIMA to LAD 06/16/2020.  No recurrence of angina since that time.  Compliant with the current medical regimen.  Risk factors for future additional trouble were discussed and include: Prediabetes, obesity, hypertension, and hyperlipidemia.  He has successfully stop smoking at this point.  Past Medical History:  Diagnosis Date  . Asthma   . Coronary artery disease    a. NSTEMI  s/p DES stent to LAD in 09/2016. b. NSTEMI s/p DES to LAD in 11/2018, EF 55-65%.  . High cholesterol   . History of noncompliance with medical treatment, presenting hazards to health   . Hypertension   . Migraine    "grew out of them; sports related" (12/17/2018)  . NSTEMI (non-ST elevated myocardial infarction) (HCC) 08/2016; 12/16/2018  . Pre-diabetes   . Torsion, testicular     Past Surgical History:  Procedure Laterality Date  . CARDIAC CATHETERIZATION N/A 09/30/2016   Procedure: Left Heart Cath and Coronary Angiography;  Surgeon: Lyn Records, MD;  Location: [facility] INVASIVE CV LAB;  Service: Cardiovascular;  Laterality: N/A;  . CARDIAC CATHETERIZATION N/A 09/30/2016   Procedure: Intravascular Pressure Wire/FFR Study;  Surgeon: Lyn Records, MD;  Location: [facility] INVASIVE CV LAB;  Service: Cardiovascular;  Laterality: N/A;  . CARDIAC CATHETERIZATION N/A 09/30/2016   Procedure: Coronary Stent Intervention;  Surgeon:  Lyn Records, MD;  Location: [facility] INVASIVE CV LAB;  Service: Cardiovascular;  Laterality: N/A;  . CORONARY ANGIOPLASTY WITH STENT PLACEMENT  12/17/2018  . CORONARY ARTERY BYPASS GRAFT N/A 06/16/2020   Procedure: CORONARY ARTERY BYPASS GRAFTING (CABG) LIMA to LAD.;  Surgeon: Delight Ovens, MD;  Location: [facility] OR;  Service: Open Heart Surgery;  Laterality: N/A;  OFF-PUMP  . I & D EXTREMITY  10/21/2012   Procedure: IRRIGATION AND DEBRIDEMENT EXTREMITY;  Surgeon: Marlowe Shores, MD;  Location: [facility] OR;  Service: Orthopedics;  Laterality: Left;  . INCISION / DRAINAGE HAND / FINGER Right    "bit by spider twice; once on left thumb; once on right middle finger" (12/17/2018  . LEFT HEART CATH AND CORONARY ANGIOGRAPHY N/A 12/17/2018   Procedure: LEFT HEART CATH AND CORONARY ANGIOGRAPHY;  Surgeon: Runell Gess, MD;  Location: [facility] INVASIVE CV LAB;  Service: Cardiovascular;  Laterality: N/A;  . LEFT HEART CATH AND CORONARY ANGIOGRAPHY N/A 06/12/2020   Procedure: LEFT HEART CATH AND CORONARY ANGIOGRAPHY;  Surgeon: Tonny Bollman, MD;  Location: [facility] INVASIVE CV LAB;  Service: Cardiovascular;  Laterality: N/A;  . TEE WITHOUT CARDIOVERSION N/A 06/16/2020   Procedure: TRANSESOPHAGEAL ECHOCARDIOGRAM (TEE);  Surgeon: Delight Ovens, MD;  Location: [facility] OR;  Service: Open Heart Surgery;  Laterality: N/A;  . TESTICLE TORSION REDUCTION  ~ 1997    Current Medications: Current Meds  Medication Sig  . aspirin 81 MG EC tablet Take 1 tablet (81 mg total) by mouth daily.  Marland Kitchen atorvastatin (LIPITOR) 80 MG  tablet Take 1 tablet (80 mg total) by mouth every evening.  . carvedilol (COREG) 6.25 MG tablet Take 1 tablet (6.25 mg total) by mouth 2 (two) times daily with a meal.  . lisinopril (ZESTRIL) 20 MG tablet Take 1 tablet (20 mg total) by mouth daily.  . Multiple Vitamin (MULTIVITAMIN WITH MINERALS) TABS tablet Take 1 tablet by mouth daily.  . nitroGLYCERIN (NITROSTAT) 0.4 MG SL tablet Place 1 tablet (0.4 mg total)  under the tongue every 5 (five) minutes as needed for chest pain (up to 3 doses).  . Omega-3 Fatty Acids (FISH OIL PO) Take 1 capsule by mouth daily.     Allergies:   Codeine   Social History   Socioeconomic History  . Marital status: Single    Spouse name: Not on file  . Number of children: Not on file  . Years of education: Not on file  . Highest education level: Not on file  Occupational History  . Occupation: works at [employer]  Tobacco Use  . Smoking status: Former Smoker    Packs/day: 0.40    Years: 23.00    Pack years: 9.20    Types: Cigarettes    Quit date: 12/17/2018    Years since quitting: 1.7  . Smokeless tobacco: Never Used  Vaping Use  . Vaping Use: Never used  Substance and Sexual Activity  . Alcohol use: Yes    Comment: 12/17/2018 "maybe 6 beer/smonth; if that"  . Drug use: Yes    Types: Marijuana    Comment: 12/17/2018 "maybe 2 times/wk"  . Sexual activity: Yes  Other Topics Concern  . Not on file  Social History Narrative  . Not on file   Social Determinants of Health   Financial Resource Strain:   . Difficulty of Paying Living Expenses: Not on file  Food Insecurity:   . Worried About Programme researcher, broadcasting/film/videounning Out of Food in the Last Year: Not on file  . Ran Out of Food in the Last Year: Not on file  Transportation Needs:   . Lack of Transportation (Medical): Not on file  . Lack of Transportation (Non-Medical): Not on file  Physical Activity:   . Days of Exercise per Week: Not on file  . Minutes of Exercise per Session: Not on file  Stress:   . Feeling of Stress : Not on file  Social Connections:   . Frequency of Communication with Friends and Family: Not on file  . Frequency of Social Gatherings with Friends and Family: Not on file  . Attends Religious Services: Not on file  . Active Member of Clubs or Organizations: Not on file  . Attends BankerClub or Organization Meetings: Not on file  . Marital Status: Not on file     Family History: The  patient's family history includes Heart attack in his sister; Hypertension in his maternal grandmother and mother.  ROS:   Please see the history of present illness.    Frequent urination at night.  This is relatively new.  All other systems reviewed and are negative.  EKGs/Labs/Other Studies Reviewed:    The following studies were reviewed today: CARDIAC CATH 05/2020: Diagnostic Dominance: Right    EKG:  EKG borderline short PR interval, small inferior Q waves, and diffuse ST elevation in many leads especially precordial.  This was performed on 06/19/2020 after coronary bypass surgery.  Recent Labs: 01/20/2020: ALT 16; TSH 0.512 06/17/2020: Magnesium 1.9 06/20/2020: BUN 15; Creatinine, Ser 1.06; Hemoglobin 15.3; Platelets 290; Potassium 3.7; Sodium  137  Recent Lipid Panel    Component Value Date/Time   CHOL 137 01/20/2020 0950   TRIG 105 01/20/2020 0950   HDL 49 01/20/2020 0950   CHOLHDL 2.8 01/20/2020 0950   CHOLHDL 3.1 12/16/2018 1830   VLDL 34 12/16/2018 1830   LDLCALC 69 01/20/2020 0950    Physical Exam:    VS:  BP 114/78   Pulse 86   Ht 5\' 9"  (1.753 m)   Wt 218 lb 3.2 oz (99 kg)   SpO2 98%   BMI 32.22 kg/m     Wt Readings from Last 3 Encounters:  08/30/20 218 lb 3.2 oz (99 kg)  07/17/20 219 lb (99.3 kg)  07/10/20 220 lb 6.4 oz (100 kg)     GEN: Moderate obesity. No acute distress HEENT: Normal NECK: No JVD. LYMPHATICS: No lymphadenopathy CARDIAC:  RRR without murmur, gallop, or edema. VASCULAR:  Normal Pulses. No bruits. RESPIRATORY:  Clear to auscultation without rales, wheezing or rhonchi  ABDOMEN: Soft, non-tender, non-distended, No pulsatile mass, MUSCULOSKELETAL: No deformity  SKIN: Warm and dry NEUROLOGIC:  Alert and oriented x 3 PSYCHIATRIC:  Normal affect   ASSESSMENT:    1. Coronary artery disease involving native coronary artery of native heart without angina pectoris   2. Essential hypertension   3. Tobacco abuse   4. Hyperlipidemia with  target LDL less than 70   5. Non-ST elevation myocardial infarction (NSTEMI), subsequent episode of care (HCC)   6. Educated about COVID-19 virus infection   7. Elevated hemoglobin A1c   8. Hyperglycemia   9. Frequent urination at night    PLAN:    In order of problems listed above:  1. Secondary prevention discussed in great detail.  150 minutes more of moderate activity is recommended.  LDL less than 70, blood pressure 130/80 mmHg or less, and hemoglobin A1c less than 7 were all discussed. 2. Currently taking carvedilol and Zestril with good blood pressure effect.  Continue the same dosing. 3. Smoking cessation is encouraged. 4. LDL cholesterol when last evaluated 9 in January 2021. 5. No significant decrease in LV function was noted 6. He has not been vaccinated.  He is considering becoming vaccinated. 7. Hemoglobin A1c will be evaluated again in light of nocturia.  PSA will also be performed. 8. Blood sugar will be obtained as well. 9. PSA to rule out prostate cancer.  Overall education and awareness concerning primary/secondary risk prevention was discussed in detail: LDL less than 70, hemoglobin A1c less than 7, blood pressure target less than 130/80 mmHg, >150 minutes of moderate aerobic activity per week, avoidance of smoking, weight control (via diet and exercise), and continued surveillance/management of/for obstructive sleep apnea.    Medication Adjustments/Labs and Tests Ordered: Current medicines are reviewed at length with the patient today.  Concerns regarding medicines are outlined above.  Orders Placed This Encounter  Procedures  . HgB A1c  . Basic metabolic panel  . PSA   No orders of the defined types were placed in this encounter.   Patient Instructions  Medication Instructions:  Your physician recommends that you continue on your current medications as directed. Please refer to the Current Medication list given to you today.  *If you need a refill on your  cardiac medications before your next appointment, please call your pharmacy*   Lab Work: BMET, A1C and PSA today  If you have labs (blood work) drawn today and your tests are completely normal, you will receive your results only by: February 2021  MyChart Message (if you have MyChart) OR . A paper copy in the mail If you have any lab test that is abnormal or we need to change your treatment, we will call you to review the results.   Testing/Procedures: None   Follow-Up: At [facility], you and your health needs are our priority.  As part of our continuing mission to provide you with exceptional heart care, we have created designated Provider Care Teams.  These Care Teams include your primary Cardiologist (physician) and Advanced Practice Providers (APPs -  Physician Assistants and Nurse Practitioners) who all work together to provide you with the care you need, when you need it.  We recommend signing up for the patient portal called "MyChart".  Sign up information is provided on this After Visit Summary.  MyChart is used to connect with patients for Virtual Visits (Telemedicine).  Patients are able to view lab/test results, encounter notes, upcoming appointments, etc.  Non-urgent messages can be sent to your provider as well.   To learn more about what you can do with MyChart, go to [email or web address].    Your next appointment:   3 month(s)  The format for your next appointment:   In Person  Provider:   You may see Lesleigh Noe, MD or one of the following Advanced Practice Providers on your designated Care Team:    Norma Fredrickson, NP  Nada Boozer, NP  Georgie Chard, NP    Other Instructions      Signed, Lesleigh Noe, MD  08/30/2020 12:43 PM    [facility]

## 2020-08-30 ENCOUNTER — Encounter: Payer: Self-pay | Admitting: Interventional Cardiology

## 2020-08-30 ENCOUNTER — Other Ambulatory Visit: Payer: Self-pay

## 2020-08-30 ENCOUNTER — Ambulatory Visit (INDEPENDENT_AMBULATORY_CARE_PROVIDER_SITE_OTHER): Payer: 59 | Admitting: Interventional Cardiology

## 2020-08-30 VITALS — BP 114/78 | HR 86 | Ht 69.0 in | Wt 218.2 lb

## 2020-08-30 DIAGNOSIS — I10 Essential (primary) hypertension: Secondary | ICD-10-CM

## 2020-08-30 DIAGNOSIS — R739 Hyperglycemia, unspecified: Secondary | ICD-10-CM

## 2020-08-30 DIAGNOSIS — I2510 Atherosclerotic heart disease of native coronary artery without angina pectoris: Secondary | ICD-10-CM

## 2020-08-30 DIAGNOSIS — E785 Hyperlipidemia, unspecified: Secondary | ICD-10-CM

## 2020-08-30 DIAGNOSIS — R7309 Other abnormal glucose: Secondary | ICD-10-CM

## 2020-08-30 DIAGNOSIS — Z720 Tobacco use: Secondary | ICD-10-CM

## 2020-08-30 DIAGNOSIS — I214 Non-ST elevation (NSTEMI) myocardial infarction: Secondary | ICD-10-CM

## 2020-08-30 DIAGNOSIS — Z7189 Other specified counseling: Secondary | ICD-10-CM

## 2020-08-30 DIAGNOSIS — R351 Nocturia: Secondary | ICD-10-CM

## 2020-08-30 NOTE — Patient Instructions (Signed)
Medication Instructions:  Your physician recommends that you continue on your current medications as directed. Please refer to the Current Medication list given to you today.  *If you need a refill on your cardiac medications before your next appointment, please call your pharmacy*   Lab Work: BMET, A1C and PSA today  If you have labs (blood work) drawn today and your tests are completely normal, you will receive your results only by: Marland Kitchen MyChart Message (if you have MyChart) OR . A paper copy in the mail If you have any lab test that is abnormal or we need to change your treatment, we will call you to review the results.   Testing/Procedures: None   Follow-Up: At [facility], you and your health needs are our priority.  As part of our continuing mission to provide you with exceptional heart care, we have created designated Provider Care Teams.  These Care Teams include your primary Cardiologist (physician) and Advanced Practice Providers (APPs -  Physician Assistants and Nurse Practitioners) who all work together to provide you with the care you need, when you need it.  We recommend signing up for the patient portal called "MyChart".  Sign up information is provided on this After Visit Summary.  MyChart is used to connect with patients for Virtual Visits (Telemedicine).  Patients are able to view lab/test results, encounter notes, upcoming appointments, etc.  Non-urgent messages can be sent to your provider as well.   To learn more about what you can do with MyChart, go to [email or web address].    Your next appointment:   3 month(s)  The format for your next appointment:   In Person  Provider:   You may see Lesleigh Noe, MD or one of the following Advanced Practice Providers on your designated Care Team:    Norma Fredrickson, NP  Nada Boozer, NP  Georgie Chard, NP    Other Instructions

## 2020-08-31 ENCOUNTER — Other Ambulatory Visit: Payer: Self-pay | Admitting: *Deleted

## 2020-08-31 ENCOUNTER — Other Ambulatory Visit: Payer: Self-pay | Admitting: Interventional Cardiology

## 2020-08-31 ENCOUNTER — Telehealth: Payer: Self-pay | Admitting: Interventional Cardiology

## 2020-08-31 DIAGNOSIS — R7309 Other abnormal glucose: Secondary | ICD-10-CM

## 2020-08-31 LAB — HEMOGLOBIN A1C
Est. average glucose Bld gHb Est-mCnc: 289 mg/dL
Hgb A1c MFr Bld: 11.7 % — ABNORMAL HIGH (ref 4.8–5.6)

## 2020-08-31 LAB — BASIC METABOLIC PANEL
BUN/Creatinine Ratio: 11 (ref 9–20)
BUN: 13 mg/dL (ref 6–24)
CO2: 26 mmol/L (ref 20–29)
Calcium: 10.1 mg/dL (ref 8.7–10.2)
Chloride: 91 mmol/L — ABNORMAL LOW (ref 96–106)
Creatinine, Ser: 1.16 mg/dL (ref 0.76–1.27)
GFR calc Af Amer: 90 mL/min/{1.73_m2} (ref >59)
GFR calc non Af Amer: 78 mL/min/{1.73_m2} (ref >59)
Glucose: 451 mg/dL — ABNORMAL HIGH (ref 65–99)
Potassium: 4.9 mmol/L (ref 3.5–5.2)
Sodium: 133 mmol/L — ABNORMAL LOW (ref 134–144)

## 2020-08-31 LAB — PSA: Prostate Specific Ag, Serum: 0.3 ng/mL (ref 0.0–4.0)

## 2020-08-31 MED ORDER — METFORMIN HCL 500 MG PO TABS: 500.0000 mg | ORAL_TABLET | Freq: Two times a day (BID) | ORAL | 3 refills | Status: DC

## 2020-08-31 MED FILL — METFORMIN HCL 500 MG TABS: 500 | 30 days supply | Qty: 60 | Fill #0

## 2020-08-31 NOTE — Telephone Encounter (Signed)
PT AWARE OF LAB RESULTS AND RECOMMENDATIONS  AND AGREES WITH PLAN ./CY   Let the patient know yes, diabetic. Start therapy as already recommended, Nutrition consult to educate about Diabetic diet, and stress importance of therapy A copy will be sent to Arvilla Market, DO   Lyn Records, MD  08/30/2020 10:21 PM EDT     Let the patient know he is definitely diabetic. Start Metformin 500 mg BID and have him see PCP immediately. A copy will be sent to Arvilla Market, DO

## 2020-08-31 NOTE — Telephone Encounter (Signed)
New Message:    Pt said he saw his lab results on My Chart. He said he did not understand it, he would like for somebody to explain it to him please.

## 2020-09-21 ENCOUNTER — Encounter: Payer: 59 | Attending: Internal Medicine | Admitting: Dietician

## 2020-09-21 ENCOUNTER — Encounter: Payer: Self-pay | Admitting: Dietician

## 2020-09-21 ENCOUNTER — Other Ambulatory Visit: Payer: Self-pay

## 2020-09-21 DIAGNOSIS — E119 Type 2 diabetes mellitus without complications: Secondary | ICD-10-CM | POA: Diagnosis present

## 2020-09-21 DIAGNOSIS — E118 Type 2 diabetes mellitus with unspecified complications: Secondary | ICD-10-CM | POA: Diagnosis present

## 2020-09-21 NOTE — Progress Notes (Signed)
Patient was seen on 09/21/2020 for the first of a series of three diabetes self-management courses at [facility].  Patient Education Plan per assessed needs and concerns is to attend three course education program for Diabetes Self Management Education.  The following learning objectives were met by the patient during this class:  Describe diabetes, types of diabetes and pathophysiology  State some common risk factors for diabetes  Defines the role of glucose and insulin  Describe the relationship between diabetes and cardiovascular and other risks  State the members of the Healthcare Team  States the rationale for glucose monitoring and when to test  State their individual Scott the importance of logging glucose readings and how to interpret the readings  Identifies A1C target  Explain the correlation between A1c and eAG values  State symptoms and treatment of high blood glucose and low blood glucose  Explain proper technique for glucose testing and identify proper sharps disposal  Handouts given during class include:  How to Thrive:  A Guide for Your Journey with Diabetes by the ADA  Meal Plan Card and carbohydrate content list  Dietary intake form  Low Sodium Flavoring Tips  Types of Fats  Dining Out  Label reading  Snack list  Planning a balanced meal  The diabetes portion plate  Diabetes Resources  A1c to eAG Conversion Chart  Blood Glucose Log  Diabetes Recommended Care Schedule  Support Group  Diabetes Success Plan  Core Class Satisfaction Survey   Follow-Up Plan:  Attend core 2

## 2020-09-26 ENCOUNTER — Telehealth: Payer: 59 | Admitting: Internal Medicine

## 2020-09-26 ENCOUNTER — Telehealth: Payer: Self-pay

## 2020-09-26 NOTE — Telephone Encounter (Signed)
Contacted patient to notify them of walk in appointments with the covering provider.

## 2020-09-27 ENCOUNTER — Ambulatory Visit: Payer: 59 | Admitting: Pharmacist

## 2020-09-27 ENCOUNTER — Ambulatory Visit (INDEPENDENT_AMBULATORY_CARE_PROVIDER_SITE_OTHER): Payer: 59 | Admitting: Physician Assistant

## 2020-09-27 ENCOUNTER — Other Ambulatory Visit: Payer: Self-pay

## 2020-09-27 VITALS — BP 136/93 | HR 72 | Temp 97.5°F | Resp 17 | Wt 225.0 lb

## 2020-09-27 DIAGNOSIS — E119 Type 2 diabetes mellitus without complications: Secondary | ICD-10-CM

## 2020-09-27 LAB — GLUCOSE, POCT (MANUAL RESULT ENTRY): POC Glucose: 151 mg/dl — AB (ref 70–99)

## 2020-09-27 MED ORDER — ONETOUCH DELICA LANCETS 33G MISC: 0 refills | Status: AC

## 2020-09-27 MED ORDER — ONETOUCH VERIO W/DEVICE KIT: PACK | 0 refills | Status: AC

## 2020-09-27 MED ORDER — ONETOUCH VERIO VI STRP: ORAL_STRIP | 0 refills | Status: AC

## 2020-09-27 MED ORDER — ONETOUCH DELICA LANCING DEV MISC: 0 refills | Status: AC

## 2020-09-27 MED FILL — ONETOUCH DELICA PLUS LANCET: 30 days supply | Qty: 100 | Fill #0

## 2020-09-27 MED FILL — ONETOUCH VERIO REFLECT W/DE: W/DEVICE | 30 days supply | Qty: 1 | Fill #0

## 2020-09-27 MED FILL — ONE TOUCH VERIO TEST STRIP: 30 days supply | Qty: 100 | Fill #0

## 2020-09-27 NOTE — Progress Notes (Signed)
 Established Patient Office Visit  Subjective:  Patient ID: Mike Frazier, male    DOB: 11/26/1981  Age: 39 y.o. MRN: [id number]  CC:  Chief Complaint  Patient presents with  . Diabetes    tolerating Metformin. denies nausea, vomiting, polyuria, polydipsia, numbness/tingling in his feet.    HPI Mike Frazier reports that he has been working hard to manage his blood glucose levels.  States that he is eating a lower carb diet, has stopped all sugary drinks/ fruit juices.  Has not been checking his BG at home due to lack of monitor.  Reports that his vision has improved as his blood sugars become more controlled, states that he ever had a professional eye exam.  States that he is still hesitant to become vaccinated against Covid 19.    No other concerns at this time    Past Medical History:  Diagnosis Date  . Asthma   . Coronary artery disease    a. NSTEMI  s/p DES stent to LAD in 09/2016. b. NSTEMI s/p DES to LAD in 11/2018, EF 55-65%.  . High cholesterol   . History of noncompliance with medical treatment, presenting hazards to health   . Hypertension   . Migraine    "grew out of them; sports related" (12/17/2018)  . NSTEMI (non-ST elevated myocardial infarction) (HCC) 08/2016; 12/16/2018  . Pre-diabetes   . Torsion, testicular     Past Surgical History:  Procedure Laterality Date  . CARDIAC CATHETERIZATION N/A 09/30/2016   Procedure: Left Heart Cath and Coronary Angiography;  Surgeon: Henry W Smith, MD;  [facility];  Service: Cardiovascular;  Laterality: N/A;  . CARDIAC CATHETERIZATION N/A 09/30/2016   Procedure: Intravascular Pressure Wire/FFR Study;  Surgeon: Henry W Smith, MD;  [facility]: [facility];  Service: Cardiovascular;  Laterality: N/A;  . CARDIAC CATHETERIZATION N/A 09/30/2016   Procedure: Coronary Stent Intervention;  Surgeon: Henry W Smith, MD;  [facility];  Service: Cardiovascular;  Laterality: N/A;  . CORONARY  ANGIOPLASTY WITH STENT PLACEMENT  12/17/2018  . CORONARY ARTERY BYPASS GRAFT N/A 06/16/2020   Procedure: CORONARY ARTERY BYPASS GRAFTING (CABG) LIMA to LAD.;  Surgeon: Gerhardt, Edward B, MD;  Location: [facility];  Service: Open Heart Surgery;  Laterality: N/A;  OFF-PUMP  . I & D EXTREMITY  10/21/2012   Procedure: IRRIGATION AND DEBRIDEMENT EXTREMITY;  Surgeon: Matthew A Weingold, MD;  Location: [facility];  Service: Orthopedics;  Laterality: Left;  . INCISION / DRAINAGE HAND / FINGER Right    "bit by spider twice; once on left thumb; once on right middle finger" (12/17/2018  . LEFT HEART CATH AND CORONARY ANGIOGRAPHY N/A 12/17/2018   Procedure: LEFT HEART CATH AND CORONARY ANGIOGRAPHY;  Surgeon: Berry, Jonathan J, MD;  [facility];  Service: Cardiovascular;  Laterality: N/A;  . LEFT HEART CATH AND CORONARY ANGIOGRAPHY N/A 06/12/2020   Procedure: LEFT HEART CATH AND CORONARY ANGIOGRAPHY;  Surgeon: Cooper, Michael, MD;  [facility];  Service: Cardiovascular;  Laterality: N/A;  . TEE WITHOUT CARDIOVERSION N/A 06/16/2020   Procedure: TRANSESOPHAGEAL ECHOCARDIOGRAM (TEE);  Surgeon: Gerhardt, Edward B, MD;  Location: [facility];  Service: Open Heart Surgery;  Laterality: N/A;  . TESTICLE TORSION REDUCTION  ~ 1997    Family History  Problem Relation Age of Onset  . Hypertension Mother   . Heart attack Sister        2 MI before age 42  . Hypertension Maternal Grandmother       Social History   Socioeconomic History  . Marital status: Single    Spouse name: Not on file  . Number of children: Not on file  . Years of education: Not on file  . Highest education level: Not on file  Occupational History  . Occupation: works at [employer]  . Smoking status: Former Smoker    Packs/day: 0.40    Years: 23.00    Pack years: 9.20    Types: Cigarettes    Quit date: 12/17/2018    Years since quitting: 1.7  . Smokeless tobacco: Never Used  Vaping Use  .  Vaping Use: Never used  Substance and Sexual Activity  . Alcohol use: Yes    Comment: 12/17/2018 "maybe 6 beer/smonth; if that"  . Drug use: Yes    Types: Marijuana    Comment: 12/17/2018 "maybe 2 times/wk"  . Sexual activity: Yes  Other Topics Concern  . Not on file  Social History Narrative  . Not on file   Social Determinants of Health   Financial Resource Strain:   . Difficulty of Paying Living Expenses: Not on file  Food Insecurity:   . Worried About Charity fundraiser in the Last Year: Not on file  . Ran Out of Food in the Last Year: Not on file  Transportation Needs:   . Lack of Transportation (Medical): Not on file  . Lack of Transportation (Non-Medical): Not on file  Physical Activity:   . Days of Exercise per Week: Not on file  . Minutes of Exercise per Session: Not on file  Stress:   . Feeling of Stress : Not on file  Social Connections:   . Frequency of Communication with Friends and Family: Not on file  . Frequency of Social Gatherings with Friends and Family: Not on file  . Attends Religious Services: Not on file  . Active Member of Clubs or Organizations: Not on file  . Attends Archivist Meetings: Not on file  . Marital Status: Not on file  Intimate Partner Violence:   . Fear of Current or Ex-Partner: Not on file  . Emotionally Abused: Not on file  . Physically Abused: Not on file  . Sexually Abused: Not on file    Outpatient Medications Prior to Visit  Medication Sig Dispense Refill  . aspirin 81 MG EC tablet Take 1 tablet (81 mg total) by mouth daily. 30 tablet 3  . atorvastatin (LIPITOR) 80 MG tablet Take 1 tablet (80 mg total) by mouth every evening. 90 tablet 1  . carvedilol (COREG) 6.25 MG tablet Take 1 tablet (6.25 mg total) by mouth 2 (two) times daily with a meal. 180 tablet 1  . lisinopril (ZESTRIL) 20 MG tablet Take 1 tablet (20 mg total) by mouth daily. 90 tablet 1  . metFORMIN (GLUCOPHAGE) 500 MG tablet Take 1 tablet (500 mg  total) by mouth 2 (two) times daily with a meal. 60 tablet 3  . Multiple Vitamin (MULTIVITAMIN WITH MINERALS) TABS tablet Take 1 tablet by mouth daily.    . nitroGLYCERIN (NITROSTAT) 0.4 MG SL tablet Place 1 tablet (0.4 mg total) under the tongue every 5 (five) minutes as needed for chest pain (up to 3 doses). 25 tablet 3  . Omega-3 Fatty Acids (FISH OIL PO) Take 1 capsule by mouth daily.     No facility-administered medications prior to visit.    Allergies  Allergen Reactions  . Codeine Shortness Of Breath    ROS  Review of Systems  Constitutional: Negative.   HENT: Negative.   Eyes: Negative.   Respiratory: Negative.   Cardiovascular: Negative.   Gastrointestinal: Negative.   Endocrine: Negative.   Genitourinary: Negative.   Musculoskeletal: Negative.   Skin: Negative.   Allergic/Immunologic: Negative.   Neurological: Negative.   Hematological: Negative.   Psychiatric/Behavioral: Negative.       Objective:    Physical Exam Vitals and nursing note reviewed.  Constitutional:      Appearance: Normal appearance.  HENT:     Head: Normocephalic and atraumatic.     Right Ear: External ear normal.     Left Ear: External ear normal.     Nose: Nose normal.     Mouth/Throat:     Mouth: Mucous membranes are moist.     Pharynx: Oropharynx is clear.  Eyes:     Extraocular Movements: Extraocular movements intact.     Conjunctiva/sclera: Conjunctivae normal.     Pupils: Pupils are equal, round, and reactive to light.  Cardiovascular:     Rate and Rhythm: Normal rate and regular rhythm.     Pulses: Normal pulses.     Heart sounds: Normal heart sounds.  Pulmonary:     Effort: Pulmonary effort is normal.     Breath sounds: Normal breath sounds.  Abdominal:     General: Abdomen is flat.     Palpations: Abdomen is soft.  Musculoskeletal:        General: Normal range of motion.     Cervical back: Normal range of motion and neck supple.  Skin:    General: Skin is warm and  dry.  Neurological:     General: No focal deficit present.     Mental Status: He is alert and oriented to person, place, and time.  Psychiatric:        Mood and Affect: Mood normal.        Behavior: Behavior normal.        Thought Content: Thought content normal.        Judgment: Judgment normal.     BP (!) 136/93   Pulse 72   Temp (!) 97.5 F (36.4 C) (Temporal)   Resp 17   Wt 225 lb (102.1 kg)   SpO2 96%   BMI 33.23 kg/m  Wt Readings from Last 3 Encounters:  09/27/20 225 lb (102.1 kg)  08/30/20 218 lb 3.2 oz (99 kg)  07/17/20 219 lb (99.3 kg)     Health Maintenance Due  Topic Date Due  . TETANUS/TDAP  Never done    There are no preventive care reminders to display for this patient.  Lab Results  Component Value Date   TSH 0.512 01/20/2020   Lab Results  Component Value Date   WBC 11.7 (H) 06/20/2020   HGB 15.3 06/20/2020   HCT 46.4 06/20/2020   MCV 87.2 06/20/2020   PLT 290 06/20/2020   Lab Results  Component Value Date   NA 133 (L) 08/30/2020   K 4.9 08/30/2020   CO2 26 08/30/2020   GLUCOSE 451 (H) 08/30/2020   BUN 13 08/30/2020   CREATININE 1.16 08/30/2020   BILITOT 1.2 01/20/2020   ALKPHOS 68 01/20/2020   AST 16 01/20/2020   ALT 16 01/20/2020   PROT 7.1 01/20/2020   ALBUMIN 4.9 01/20/2020   CALCIUM 10.1 08/30/2020   ANIONGAP 13 06/20/2020   Lab Results  Component Value Date   CHOL 137 01/20/2020   Lab Results  Component Value Date     HDL 49 01/20/2020   Lab Results  Component Value Date   LDLCALC 69 01/20/2020   Lab Results  Component Value Date   TRIG 105 01/20/2020   Lab Results  Component Value Date   CHOLHDL 2.8 01/20/2020   Lab Results  Component Value Date   HGBA1C 11.7 (H) 08/30/2020      Assessment & Plan:   Problem List Items Addressed This Visit    None    Visit Diagnoses    New onset type 2 diabetes mellitus ([place])    -  Primary   Relevant Medications   Blood Glucose Monitoring Suppl (ONETOUCH VERIO)  w/Device KIT   glucose blood (ONETOUCH VERIO) test strip   Lancet Devices (ONE TOUCH DELICA LANCING DEV) MISC   OneTouch Delica Lancets 33A MISC   Other Relevant Orders   Glucose (CBG) (Completed)   Microalbumin/Creatinine Ratio, Urine   Ambulatory referral to Ophthalmology    1. New onset type 2 diabetes mellitus ([place]) Patient education given on Mediterranean style eating, congratulated patient on efforts, encourage patient to check blood glucose on a daily basis.  Patient education given on coronavirus vaccines, patient has appointment tomorrow with clinic pharmacist, encouraged him to address questions he may have concerning vaccination.   - Glucose (CBG) - Microalbumin/Creatinine Ratio, Urine - Ambulatory referral to Ophthalmology - Blood Glucose Monitoring Suppl (ONETOUCH VERIO) w/Device KIT; Use to check fasting blood sugars daily. Dx: E11.9  Dispense: 1 kit; Refill: 0 - glucose blood (ONETOUCH VERIO) test strip; Use to check fasting blood sugars daily. Dx: E11.9  Dispense: 100 each; Refill: 0 - Lancet Devices (ONE TOUCH DELICA LANCING DEV) MISC; Use to check fasting blood sugars daily. Dx: E11.9  Dispense: 1 each; Refill: 0 - OneTouch Delica Lancets 25K MISC; Use to check fasting blood sugars daily. Dx: E11.9  Dispense: 100 each; Refill: 0   Meds ordered this encounter  Medications  . Blood Glucose Monitoring Suppl (ONETOUCH VERIO) w/Device KIT    Sig: Use to check fasting blood sugars daily. Dx: E11.9    Dispense:  1 kit    Refill:  0  . glucose blood (ONETOUCH VERIO) test strip    Sig: Use to check fasting blood sugars daily. Dx: E11.9    Dispense:  100 each    Refill:  0  . Lancet Devices (ONE TOUCH DELICA LANCING DEV) MISC    Sig: Use to check fasting blood sugars daily. Dx: E11.9    Dispense:  1 each    Refill:  0  . OneTouch Delica Lancets 53Z MISC    Sig: Use to check fasting blood sugars daily. Dx: E11.9    Dispense:  100 each    Refill:  0    I have reviewed  the patient's medical history (PMH, PSH, Social History, Family History, Medications, and allergies) , and have been updated if relevant. I spent 30 minutes reviewing chart and  face to face time with patient.    Follow-up: Return in about 2 months (around 11/27/2020) for DM f/up(in person).    Loraine Grip Mayers, PA-C

## 2020-09-27 NOTE — Patient Instructions (Signed)
Keep up the great work!  You will have a 2 month follow up with Dr. Juleen China, she will check your A1C at that time.  I have sent a script for a blood glucose monitoring kit to your pharmacy  Kennieth Rad, PA-C Physician Assistant [facility] [email or web address]    Mediterranean Diet A Mediterranean diet refers to food and lifestyle choices that are based on the traditions of countries located on the [place]. This way of eating has been shown to help prevent certain conditions and improve outcomes for people who have chronic diseases, like kidney disease and heart disease. What are tips for following this plan? Lifestyle  Cook and eat meals together with your family, when possible.  Drink enough fluid to keep your urine clear or pale yellow.  Be physically active every day. This includes: ? Aerobic exercise like running or swimming. ? Leisure activities like gardening, walking, or housework.  Get 7-8 hours of sleep each night.  If recommended by your health care provider, drink red wine in moderation. This means 1 glass a day for nonpregnant women and 2 glasses a day for men. A glass of wine equals 5 oz (150 mL). Reading food labels   Check the serving size of packaged foods. For foods such as rice and pasta, the serving size refers to the amount of cooked product, not dry.  Check the total fat in packaged foods. Avoid foods that have saturated fat or trans fats.  Check the ingredients list for added sugars, such as corn syrup. Shopping  At the grocery store, buy most of your food from the areas near the walls of the store. This includes: ? Fresh fruits and vegetables (produce). ? Grains, beans, nuts, and seeds. Some of these may be available in unpackaged forms or large amounts (in bulk). ? Fresh seafood. ? Poultry and eggs. ? Low-fat dairy products.  Buy whole ingredients instead of prepackaged  foods.  Buy fresh fruits and vegetables in-season from local farmers markets.  Buy frozen fruits and vegetables in resealable bags.  If you do not have access to quality fresh seafood, buy precooked frozen shrimp or canned fish, such as tuna, salmon, or sardines.  Buy small amounts of raw or cooked vegetables, salads, or olives from the deli or salad bar at your store.  Stock your pantry so you always have certain foods on hand, such as olive oil, canned tuna, canned tomatoes, rice, pasta, and beans. Cooking  Cook foods with extra-virgin olive oil instead of using butter or other vegetable oils.  Have meat as a side dish, and have vegetables or grains as your main dish. This means having meat in small portions or adding small amounts of meat to foods like pasta or stew.  Use beans or vegetables instead of meat in common dishes like chili or lasagna.  Experiment with different cooking methods. Try roasting or broiling vegetables instead of steaming or sauteing them.  Add frozen vegetables to soups, stews, pasta, or rice.  Add nuts or seeds for added healthy fat at each meal. You can add these to yogurt, salads, or vegetable dishes.  Marinate fish or vegetables using olive oil, lemon juice, garlic, and fresh herbs. Meal planning   Plan to eat 1 vegetarian meal one day each week. Try to work up to 2 vegetarian meals, if possible.  Eat seafood 2 or more times a week.  Have healthy snacks readily available, such as: ? Vegetable sticks with hummus. ? [place]  yogurt. ? Fruit and nut trail mix.  Eat balanced meals throughout the week. This includes: ? Fruit: 2-3 servings a day ? Vegetables: 4-5 servings a day ? Low-fat dairy: 2 servings a day ? Fish, poultry, or lean meat: 1 serving a day ? Beans and legumes: 2 or more servings a week ? Nuts and seeds: 1-2 servings a day ? Whole grains: 6-8 servings a day ? Extra-virgin olive oil: 3-4 servings a day  Limit red meat and sweets  to only a few servings a month What are my food choices?  Mediterranean diet ? Recommended  Grains: Whole-grain pasta. Brown rice. Bulgar wheat. Polenta. Couscous. Whole-wheat bread. [place].  Vegetables: Artichokes. Beets. Broccoli. Cabbage. Carrots. Eggplant. Green beans. Chard. Kale. Spinach. Onions. Leeks. Peas. Squash. Tomatoes. Peppers. Radishes.  Fruits: Apples. Apricots. Avocado. Berries. Bananas. Cherries. Dates. Figs. Grapes. Lemons. Melon. Oranges. Peaches. Plums. Pomegranate.  Meats and other protein foods: Beans. Almonds. Sunflower seeds. Pine nuts. Peanuts. [place]. Salmon. Scallops. Shrimp. [place]. Tilapia. Clams. Oysters. Eggs.  Dairy: Low-fat milk. Cheese. Greek yogurt.  Beverages: Water. Red wine. Herbal tea.  Fats and oils: Extra virgin olive oil. Avocado oil. Grape seed oil.  Sweets and desserts: [place] yogurt with honey. Baked apples. Poached pears. Trail mix.  Seasoning and other foods: Basil. Cilantro. Coriander. Cumin. Mint. Parsley. Sage. Rosemary. Tarragon. Garlic. Oregano. Thyme. Pepper. Balsalmic vinegar. Tahini. Hummus. Tomato sauce. Olives. Mushrooms. ? Limit these  Grains: Prepackaged pasta or rice dishes. Prepackaged cereal with added sugar.  Vegetables: Deep fried potatoes (french fries).  Fruits: Fruit canned in syrup.  Meats and other protein foods: Beef. Pork. Lamb. Poultry with skin. Hot dogs. [place] Salines.  Dairy: Ice cream. Sour cream. Whole milk.  Beverages: Juice. Sugar-sweetened soft drinks. Beer. Liquor and spirits.  Fats and oils: Butter. Canola oil. Vegetable oil. Beef fat (tallow). Lard.  Sweets and desserts: Cookies. Cakes. Pies. Candy.  Seasoning and other foods: Mayonnaise. Premade sauces and marinades. The items listed may not be a complete list. Talk with your dietitian about what dietary choices are right for you. Summary  The Mediterranean diet includes both food and lifestyle choices.  Eat a variety of fresh fruits and  vegetables, beans, nuts, seeds, and whole grains.  Limit the amount of red meat and sweets that you eat.  Talk with your health care provider about whether it is safe for you to drink red wine in moderation. This means 1 glass a day for nonpregnant women and 2 glasses a day for men. A glass of wine equals 5 oz (150 mL). This information is not intended to replace advice given to you by your health care provider. Make sure you discuss any questions you have with your health care provider. Document Revised: 08/15/2016 Document Reviewed: 08/08/2016 Elsevier Patient Education  [place].

## 2020-09-28 ENCOUNTER — Encounter: Payer: Self-pay | Admitting: Dietician

## 2020-09-28 ENCOUNTER — Encounter: Payer: 59 | Admitting: Dietician

## 2020-09-28 ENCOUNTER — Encounter: Payer: Self-pay | Admitting: Pharmacist

## 2020-09-28 ENCOUNTER — Ambulatory Visit: Payer: 59 | Attending: Internal Medicine | Admitting: Pharmacist

## 2020-09-28 ENCOUNTER — Other Ambulatory Visit: Payer: Self-pay | Admitting: Family Medicine

## 2020-09-28 ENCOUNTER — Other Ambulatory Visit: Payer: Self-pay

## 2020-09-28 DIAGNOSIS — Z79899 Other long term (current) drug therapy: Secondary | ICD-10-CM

## 2020-09-28 DIAGNOSIS — E119 Type 2 diabetes mellitus without complications: Secondary | ICD-10-CM

## 2020-09-28 DIAGNOSIS — E118 Type 2 diabetes mellitus with unspecified complications: Secondary | ICD-10-CM | POA: Diagnosis not present

## 2020-09-28 LAB — MICROALBUMIN / CREATININE URINE RATIO
Creatinine, Urine: 138.6 mg/dL
Microalb/Creat Ratio: 10 mg/g creat (ref 0–29)
Microalbumin, Urine: 14.1 ug/mL

## 2020-09-28 MED ORDER — METFORMIN HCL 500 MG PO TABS: 1000.0000 mg | ORAL_TABLET | Freq: Two times a day (BID) | ORAL | 2 refills | Status: DC

## 2020-09-28 NOTE — Progress Notes (Signed)
Patient was seen on 09/28/2020 for the second of a series of three diabetes self-management courses at [facility]. The following learning objectives were met by the patient during this class:   Describe the role of different macronutrients on glucose  Explain how carbohydrates affect blood glucose  State what foods contain the most carbohydrates  Demonstrate carbohydrate counting  Demonstrate how to read Nutrition Facts food label  Describe effects of various fats on heart health  Describe the importance of good nutrition for health and healthy eating strategies  Describe techniques for managing your shopping, cooking and meal planning  List strategies to follow meal plan when dining out  Describe the effects of alcohol on glucose and how to use it safely  Goals:  Follow Diabetes Meal Plan as instructed  Aim to spread carbs evenly throughout the day  Aim for 3 meals per day and snacks as needed Include lean protein foods to meals/snacks  Monitor glucose levels as instructed by your doctor   Follow-Up Plan:  Attend Core 3  Work towards following your personal food plan.

## 2020-09-28 NOTE — Progress Notes (Signed)
Patient was educated on the use of the One Touch blood glucose meter. Reviewed necessary supplies and operation of the meter. Also reviewed goal blood glucose levels. Patient was able to demonstrate use. All questions and concerns were addressed.  Will have patient collect data and return for a full diabetes visit in two weeks.  Butch Penny, PharmD, CPP Clinical Pharmacist [facility] [phone]

## 2020-09-29 MED FILL — METFORMIN HCL 500 MG TABS: 500 | 30 days supply | Qty: 120 | Fill #0

## 2020-10-05 ENCOUNTER — Encounter: Payer: 59 | Attending: Internal Medicine | Admitting: Dietician

## 2020-10-05 ENCOUNTER — Other Ambulatory Visit: Payer: Self-pay

## 2020-10-05 ENCOUNTER — Encounter: Payer: Self-pay | Admitting: Dietician

## 2020-10-05 DIAGNOSIS — E118 Type 2 diabetes mellitus with unspecified complications: Secondary | ICD-10-CM | POA: Insufficient documentation

## 2020-10-05 MED FILL — LISINOPRIL 20 MG TABLET: 20 | 90 days supply | Qty: 90 | Fill #1

## 2020-10-05 NOTE — Progress Notes (Signed)
Patient was seen on 10/05/2020 for the third of a series of three diabetes self-management courses at the [facility].    State the amount of activity recommended for healthy living  Describe activities suitable for individual needs  Identify ways to regularly incorporate activity into daily life  Identify barriers to activity and ways to over come these barriers  Identify diabetes medications being personally used and their primary action for lowering glucose and possible side effects  Describe role of stress on blood glucose and develop strategies to address psychosocial issues  Identify diabetes complications and ways to prevent them  Explain how to manage diabetes during illness  Evaluate success in meeting personal goal  Establish 2-3 goals that they will plan to diligently work on  Goals:   I will count my carb choices at most meals and snacks, and keep up with my meals everyday starting next week  I will be active 30 minutes or more 7 times a week  I will take my diabetes medications as scheduled  I will eat less unhealthy fats by eating less fatty meats  I will test my glucose at least 3 times a day, 7 days a week  To help manage stress I will have some "me time" at least 2 times a week  Your patient has identified these potential barriers to change:  Stress  Your patient has identified their diabetes self-care support plan as  Family Support    Plan:  Attend Support Group as desired

## 2020-10-13 ENCOUNTER — Ambulatory Visit: Payer: 59 | Admitting: Pharmacist

## 2020-10-30 MED FILL — CARVEDILOL 6.25 MG TABLET: 6.25 | 90 days supply | Qty: 180 | Fill #1

## 2020-11-22 MED FILL — ATORVASTATIN CALCIUM 80 MG: 80 | 90 days supply | Qty: 90 | Fill #1

## 2020-11-22 MED FILL — METFORMIN HCL 500 MG TABS: 500 | 90 days supply | Qty: 180 | Fill #1

## 2020-11-27 ENCOUNTER — Ambulatory Visit: Payer: 59 | Admitting: Internal Medicine

## 2020-12-04 ENCOUNTER — Encounter: Payer: Self-pay | Admitting: Internal Medicine

## 2020-12-04 ENCOUNTER — Ambulatory Visit (INDEPENDENT_AMBULATORY_CARE_PROVIDER_SITE_OTHER): Payer: 59 | Admitting: Internal Medicine

## 2020-12-04 ENCOUNTER — Other Ambulatory Visit: Payer: Self-pay

## 2020-12-04 VITALS — BP 154/89 | HR 75 | Temp 97.5°F | Resp 17 | Wt 226.0 lb

## 2020-12-04 DIAGNOSIS — Z1159 Encounter for screening for other viral diseases: Secondary | ICD-10-CM | POA: Diagnosis not present

## 2020-12-04 DIAGNOSIS — I10 Essential (primary) hypertension: Secondary | ICD-10-CM

## 2020-12-04 DIAGNOSIS — E119 Type 2 diabetes mellitus without complications: Secondary | ICD-10-CM | POA: Diagnosis not present

## 2020-12-04 LAB — GLUCOSE, POCT (MANUAL RESULT ENTRY): POC Glucose: 171 mg/dl — AB (ref 70–99)

## 2020-12-04 LAB — POCT GLYCOSYLATED HEMOGLOBIN (HGB A1C): Hemoglobin A1C: 6.7 % — AB (ref 4.0–5.6)

## 2020-12-04 NOTE — Progress Notes (Signed)
Subjective:    Mike Frazier - 39 y.o. male MRN [id number]  Date of birth: April 27, 1981  HPI  Mike Frazier is here for follow up of T2DM.  Diabetes mellitus, Type 2 Disease Monitoring             Blood Sugar Ranges: Fasting - <140s              Polyuria: no              Visual problems: no   Urine Microalbumin 10 ---on Ace inhibitor   Last A1C: 11.7 (08/30/20)   Medications: Metformin 1000 mg BID  Medication Compliance: yes  Medication Side Effects             Hypoglycemia: no      Health Maintenance:  Health Maintenance Due  Topic Date Due  . Hepatitis C Screening  Never done  . COVID-19 Vaccine (1) Never done  . TETANUS/TDAP  Never done    -  reports that he quit smoking about 1 years ago. His smoking use included cigarettes. He has a 9.20 pack-year smoking history. He has never used smokeless tobacco. - Review of Systems: Per HPI. - Past Medical History: Patient Active Problem List   Diagnosis Date Noted  . 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 CAD (coronary artery disease) 06/14/2019  . CAD in native artery 12/18/2018  . Obesity 12/18/2018  . Hyperlipidemia with target LDL less than 70 12/12/2016  . Unstable angina (HCC) 10/01/2016  . Status post coronary artery stent placement   . NSTEMI (non-ST elevated myocardial infarction) (HCC) 09/30/2016  . Essential hypertension 09/28/2016   - Medications: reviewed and updated   Objective:   Physical Exam BP (!) 165/110   Pulse 75   Temp (!) 97.5 F (36.4 C) (Temporal)   Resp 17   Wt 226 lb (102.5 kg)   SpO2 98%   BMI 33.37 kg/m  Physical Exam Constitutional:      General: He is not in acute distress.    Appearance: He is not diaphoretic.  Cardiovascular:     Rate and Rhythm: Normal rate.  Pulmonary:     Effort: Pulmonary effort is normal. No respiratory distress.  Musculoskeletal:        General: Normal range of motion.  Skin:    General: Skin is warm and dry.  Neurological:     Mental Status: He is alert  and oriented to person, place, and time.  Psychiatric:        Mood and Affect: Affect normal.        Judgment: Judgment normal.            Assessment & Plan:   1. Type 2 diabetes mellitus without complication, without long-term current use of insulin (HCC) A1c significantly improved from 11.7>6.7 with addition of Metformin and adherence to carb modified diet and daily exercise regimen. Congratulated patient and encouraged continue compliance. Will plan to monitor A1c in 3 months and if stable, discussed we could consider Metformin dose decrease.  Counseled on Diabetic diet, my plate method, 578 minutes of moderate intensity exercise/week Blood sugar logs with fasting goals of 80-120 mg/dl, random of less than 469 and in the event of sugars less than 60 mg/dl or greater than 629 mg/dl encouraged to notify the clinic. Advised on the need for annual eye exams, annual foot exams, Pneumonia vaccine. - Glucose (CBG) - HgB A1c - Basic Metabolic Panel  2. Need for hepatitis C screening test - HCV  Ab w/Rflx to Verification  3. Essential hypertension BP was repeated several times several minutes apart and was elevated significantly each time. Patient asymptomatic. I discussed desiring to increase Lisinopril dose from 20 to 40 mg and having patient return for lab monitoring and BP check. However, patient has appointment with cardiology 12/10 and would like to wait for f/u prior to dose change. Will defer to cardiology at upcoming visit for HTN management. Discussed red flag symptoms that would warrant urgent medical attention in the interim.    Marcy Siren, D.O. 12/04/2020, 9:28 AM Primary Care at [facility]

## 2020-12-05 LAB — HEPATITIS C ANTIBODY: Hep C Virus Ab: <0.1 s/co ratio (ref 0.0–0.9)

## 2020-12-06 NOTE — Progress Notes (Signed)
Cardiology Office Note:    Date:  12/08/2020   ID:  Mike Frazier, DOB Feb 04, 1981, MRN [id number]  PCP:  Nicolette Bang, DO  Cardiologist:  Sinclair Grooms, MD   Referring MD: Caryl Never*   Chief Complaint  Patient presents with  . Coronary Artery Disease  . Hyperlipidemia  . Hypertension    History of Present Illness:    Mike Frazier is a 39 y.o. male with a hx of CAD (NSTEMI 2017 w/ DES to LAD, NSTEMI 11/2018 s/p DES to LAD), HTN, HLD, pre-diabetes,formertobaccouse, asthma, obesity, THC use,andepisodic noncompliance. Now s/p CABG 06/16/2020 for LAD recurrent restenosis.  Mike Frazier is doing much better.  Sugars are coming under control.  A1c done 4 days ago is 6.7 down from 11.  LDL cholesterol in January was 69.  BUN and creatinine and potassium are normal.  He is having GI side effects to otherwise no complaints.  Past Medical History:  Diagnosis Date  . Asthma   . Coronary artery disease    a. NSTEMI  s/p DES stent to LAD in 09/2016. b. NSTEMI s/p DES to LAD in 11/2018, EF 55-65%.  . High cholesterol   . History of noncompliance with medical treatment, presenting hazards to health   . Hypertension   . Migraine    "grew out of them; sports related" (12/17/2018)  . NSTEMI (non-ST elevated myocardial infarction) ([place]) 08/2016; 12/16/2018  . Pre-diabetes   . Torsion, testicular     Past Surgical History:  Procedure Laterality Date  . CARDIAC CATHETERIZATION N/A 09/30/2016   Procedure: Left Heart Cath and Coronary Angiography;  Surgeon: Belva Crome, MD;  Location: [place] [facility];  Service: Cardiovascular;  Laterality: N/A;  . CARDIAC CATHETERIZATION N/A 09/30/2016   Procedure: Intravascular Pressure Wire/FFR Study;  Surgeon: Belva Crome, MD;  Location: [place] [facility];  Service: Cardiovascular;  Laterality: N/A;  . CARDIAC CATHETERIZATION N/A 09/30/2016   Procedure: Coronary Stent Intervention;  Surgeon: Belva Crome, MD;  Location:  [place] [facility];  Service: Cardiovascular;  Laterality: N/A;  . CORONARY ANGIOPLASTY WITH STENT PLACEMENT  12/17/2018  . CORONARY ARTERY BYPASS GRAFT N/A 06/16/2020   Procedure: CORONARY ARTERY BYPASS GRAFTING (CABG) [place] to LAD.;  Surgeon: Grace Isaac, MD;  Location: [facility] OR;  Service: Open Heart Surgery;  Laterality: N/A;  OFF-PUMP  . I & D EXTREMITY  10/21/2012   Procedure: IRRIGATION AND DEBRIDEMENT EXTREMITY;  Surgeon: Schuyler Amor, MD;  Location: [place];  Service: Orthopedics;  Laterality: Left;  . INCISION / DRAINAGE HAND / FINGER Right    "bit by spider twice; once on left thumb; once on right middle finger" (12/17/2018  . LEFT HEART CATH AND CORONARY ANGIOGRAPHY N/A 12/17/2018   Procedure: LEFT HEART CATH AND CORONARY ANGIOGRAPHY;  Surgeon: Lorretta Harp, MD;  Location: [place] [facility];  Service: Cardiovascular;  Laterality: N/A;  . LEFT HEART CATH AND CORONARY ANGIOGRAPHY N/A 06/12/2020   Procedure: LEFT HEART CATH AND CORONARY ANGIOGRAPHY;  Surgeon: Sherren Mocha, MD;  Location: [place] [facility];  Service: Cardiovascular;  Laterality: N/A;  . TEE WITHOUT CARDIOVERSION N/A 06/16/2020   Procedure: TRANSESOPHAGEAL ECHOCARDIOGRAM (TEE);  Surgeon: Grace Isaac, MD;  Location: [place];  Service: Open Heart Surgery;  Laterality: N/A;  . TESTICLE TORSION REDUCTION  ~ 1997    Current Medications: Current Meds  Medication Sig  . aspirin 81 MG EC tablet Take 1 tablet (81 mg total) by mouth daily.  Marland Kitchen  atorvastatin (LIPITOR) 80 MG tablet Take 1 tablet (80 mg total) by mouth every evening.  . Blood Glucose Monitoring Suppl (ONETOUCH VERIO) w/Device KIT Use to check fasting blood sugars daily. Dx: E11.9  . carvedilol (COREG) 6.25 MG tablet Take 1 tablet (6.25 mg total) by mouth 2 (two) times daily with a meal.  . glucose blood (ONETOUCH VERIO) test strip Use to check fasting blood sugars daily. Dx: E11.9  . Lancet Devices (ONE TOUCH DELICA LANCING DEV) MISC Use to check  fasting blood sugars daily. Dx: E11.9  . lisinopril (ZESTRIL) 20 MG tablet Take 1 tablet (20 mg total) by mouth daily.  . metFORMIN (GLUCOPHAGE) 500 MG tablet Take 2 tablets (1,000 mg total) by mouth 2 (two) times daily with a meal.  . Multiple Vitamin (MULTIVITAMIN WITH MINERALS) TABS tablet Take 1 tablet by mouth daily.  . nitroGLYCERIN (NITROSTAT) 0.4 MG SL tablet Place 1 tablet (0.4 mg total) under the tongue every 5 (five) minutes as needed for chest pain (up to 3 doses).  . Omega-3 Fatty Acids (FISH OIL PO) Take 1 capsule by mouth daily.  Glory Rosebush Delica Lancets 09N MISC Use to check fasting blood sugars daily. Dx: E11.9     Allergies:   Codeine   Social History   Socioeconomic History  . Marital status: Single    Spouse name: Not on file  . Number of children: Not on file  . Years of education: Not on file  . Highest education level: Not on file  Occupational History  . Occupation: works at [employer]  . Smoking status: Former Smoker    Packs/day: 0.40    Years: 23.00    Pack years: 9.20    Types: Cigarettes    Quit date: 12/17/2018    Years since quitting: 1.9  . Smokeless tobacco: Never Used  Vaping Use  . Vaping Use: Never used  Substance and Sexual Activity  . Alcohol use: Yes    Comment: 12/17/2018 "maybe 6 beer/smonth; if that"  . Drug use: Yes    Types: Marijuana    Comment: 12/17/2018 "maybe 2 times/wk"  . Sexual activity: Yes  Other Topics Concern  . Not on file  Social History Narrative  . Not on file   Social Determinants of Health   Financial Resource Strain: Not on file  Food Insecurity: Not on file  Transportation Needs: Not on file  Physical Activity: Not on file  Stress: Not on file  Social Connections: Not on file     Family History: The patient's family history includes Heart attack in his sister; Hypertension in his maternal grandmother and mother.  ROS:   Please see the history of present illness.     Abdominal upset from time to time he feels related to Glucophage intake.  All other systems reviewed and are negative.  EKGs/Labs/Other Studies Reviewed:    The following studies were reviewed today: No new data  EKG:  EKG not repeated  Recent Labs: 01/20/2020: ALT 16; TSH 0.512 06/17/2020: Magnesium 1.9 06/20/2020: Hemoglobin 15.3; Platelets 290 08/30/2020: BUN 13; Creatinine, Ser 1.16; Potassium 4.9; Sodium 133  Recent Lipid Panel    Component Value Date/Time   CHOL 137 01/20/2020 0950   TRIG 105 01/20/2020 0950   HDL 49 01/20/2020 0950   CHOLHDL 2.8 01/20/2020 0950   CHOLHDL 3.1 12/16/2018 1830   VLDL 34 12/16/2018 1830   LDLCALC 69 01/20/2020 0950    Physical Exam:    VS:  BP 122/80   Pulse 68   Ht '5\' 9"'  (1.753 m)   Wt 226 lb (102.5 kg)   SpO2 97%   BMI 33.37 kg/m     Wt Readings from Last 3 Encounters:  12/08/20 226 lb (102.5 kg)  12/04/20 226 lb (102.5 kg)  09/27/20 225 lb (102.1 kg)     GEN: Obese. No acute distress HEENT: Normal NECK: No JVD. LYMPHATICS: No lymphadenopathy CARDIAC:  RRR without murmur, gallop, or edema. VASCULAR:  Normal Pulses. No bruits. RESPIRATORY:  Clear to auscultation without rales, wheezing or rhonchi  ABDOMEN: Soft, non-tender, non-distended, No pulsatile mass, MUSCULOSKELETAL: No deformity  SKIN: Warm and dry NEUROLOGIC:  Alert and oriented x 3 PSYCHIATRIC:  Normal affect   ASSESSMENT:    1. Coronary artery disease involving coronary bypass graft of native heart without angina pectoris   2. Tobacco abuse   3. Hyperlipidemia with target LDL less than 70   4. Elevated hemoglobin A1c   5. Essential (primary) hypertension   6. Educated about COVID-19 virus infection    PLAN:    In order of problems listed above:  1. Extensive secondary prevention conversation including emphasis on metrics outlined below. 2. He has discontinued smoking in so far not returned to use. 3. Low intensity statin therapy, 80 mg/day  Target LDL  is 70 or less. 4. Hemoglobin A1c is markedly improved, now less than 7 on Metformin and dietary changes. 5. Target blood pressure 130/80.  Continue carvedilol 6.25 mg twice daily and Zestril 20 mg/day. 6. Vaccinated and boosted.  Clinical follow-up in 6 months.  Overall education and awareness concerning secondary risk prevention was discussed in detail: LDL less than 70, hemoglobin A1c less than 7, blood pressure target less than 130/80 mmHg, >150 minutes of moderate aerobic activity per week, avoidance of smoking, weight control (via diet and exercise), and continued surveillance/management of/for obstructive sleep apnea.    Medication Adjustments/Labs and Tests Ordered: Current medicines are reviewed at length with the patient today.  Concerns regarding medicines are outlined above.  No orders of the defined types were placed in this encounter.  No orders of the defined types were placed in this encounter.   Patient Instructions  Medication Instructions:  Your physician recommends that you continue on your current medications as directed. Please refer to the Current Medication list given to you today.  *If you need a refill on your cardiac medications before your next appointment, please call your pharmacy*   Lab Work: None If you have labs (blood work) drawn today and your tests are completely normal, you will receive your results only by: Marland Kitchen MyChart Message (if you have MyChart) OR . A paper copy in the mail If you have any lab test that is abnormal or we need to change your treatment, we will call you to review the results.   Testing/Procedures: None   Follow-Up: At [facility], you and your health needs are our priority.  As part of our continuing mission to provide you with exceptional heart care, we have created designated Provider Care Teams.  These Care Teams include your primary Cardiologist (physician) and Advanced Practice Providers (APPs -  Physician Assistants  and Nurse Practitioners) who all work together to provide you with the care you need, when you need it.  We recommend signing up for the patient portal called "MyChart".  Sign up information is provided on this After Visit Summary.  MyChart is used to connect with patients for Virtual Visits (Telemedicine).  Patients are able to view lab/test results, encounter notes, upcoming appointments, etc.  Non-urgent messages can be sent to your provider as well.   To learn more about what you can do with MyChart, go to [email or web address].    Your next appointment:   6 month(s)  The format for your next appointment:   In Person  Provider:   You may see Sinclair Grooms, MD or one of the following Advanced Practice Providers on your designated Care Team:    Truitt Merle, NP  Cecilie Kicks, NP  Kathyrn Drown, NP    Other Instructions      Signed, Sinclair Grooms, MD  12/08/2020 11:57 AM    [place]

## 2020-12-08 ENCOUNTER — Encounter: Payer: Self-pay | Admitting: Interventional Cardiology

## 2020-12-08 ENCOUNTER — Ambulatory Visit (INDEPENDENT_AMBULATORY_CARE_PROVIDER_SITE_OTHER): Payer: 59 | Admitting: Interventional Cardiology

## 2020-12-08 ENCOUNTER — Other Ambulatory Visit: Payer: Self-pay

## 2020-12-08 VITALS — BP 122/80 | HR 68 | Ht 69.0 in | Wt 226.0 lb

## 2020-12-08 DIAGNOSIS — Z720 Tobacco use: Secondary | ICD-10-CM

## 2020-12-08 DIAGNOSIS — I10 Essential (primary) hypertension: Secondary | ICD-10-CM

## 2020-12-08 DIAGNOSIS — I25810 Atherosclerosis of coronary artery bypass graft(s) without angina pectoris: Secondary | ICD-10-CM

## 2020-12-08 DIAGNOSIS — E785 Hyperlipidemia, unspecified: Secondary | ICD-10-CM

## 2020-12-08 DIAGNOSIS — R7309 Other abnormal glucose: Secondary | ICD-10-CM

## 2020-12-08 DIAGNOSIS — Z7189 Other specified counseling: Secondary | ICD-10-CM

## 2020-12-08 NOTE — Patient Instructions (Signed)

## 2021-03-03 IMAGING — DX DG CHEST 1V PORT
1 series · 1 of 1 positions shown · non-contrast
Comparison: 06/17/2020

CLINICAL DATA: Post CABG

EXAM:
PORTABLE CHEST 1 VIEW

[chest ap]
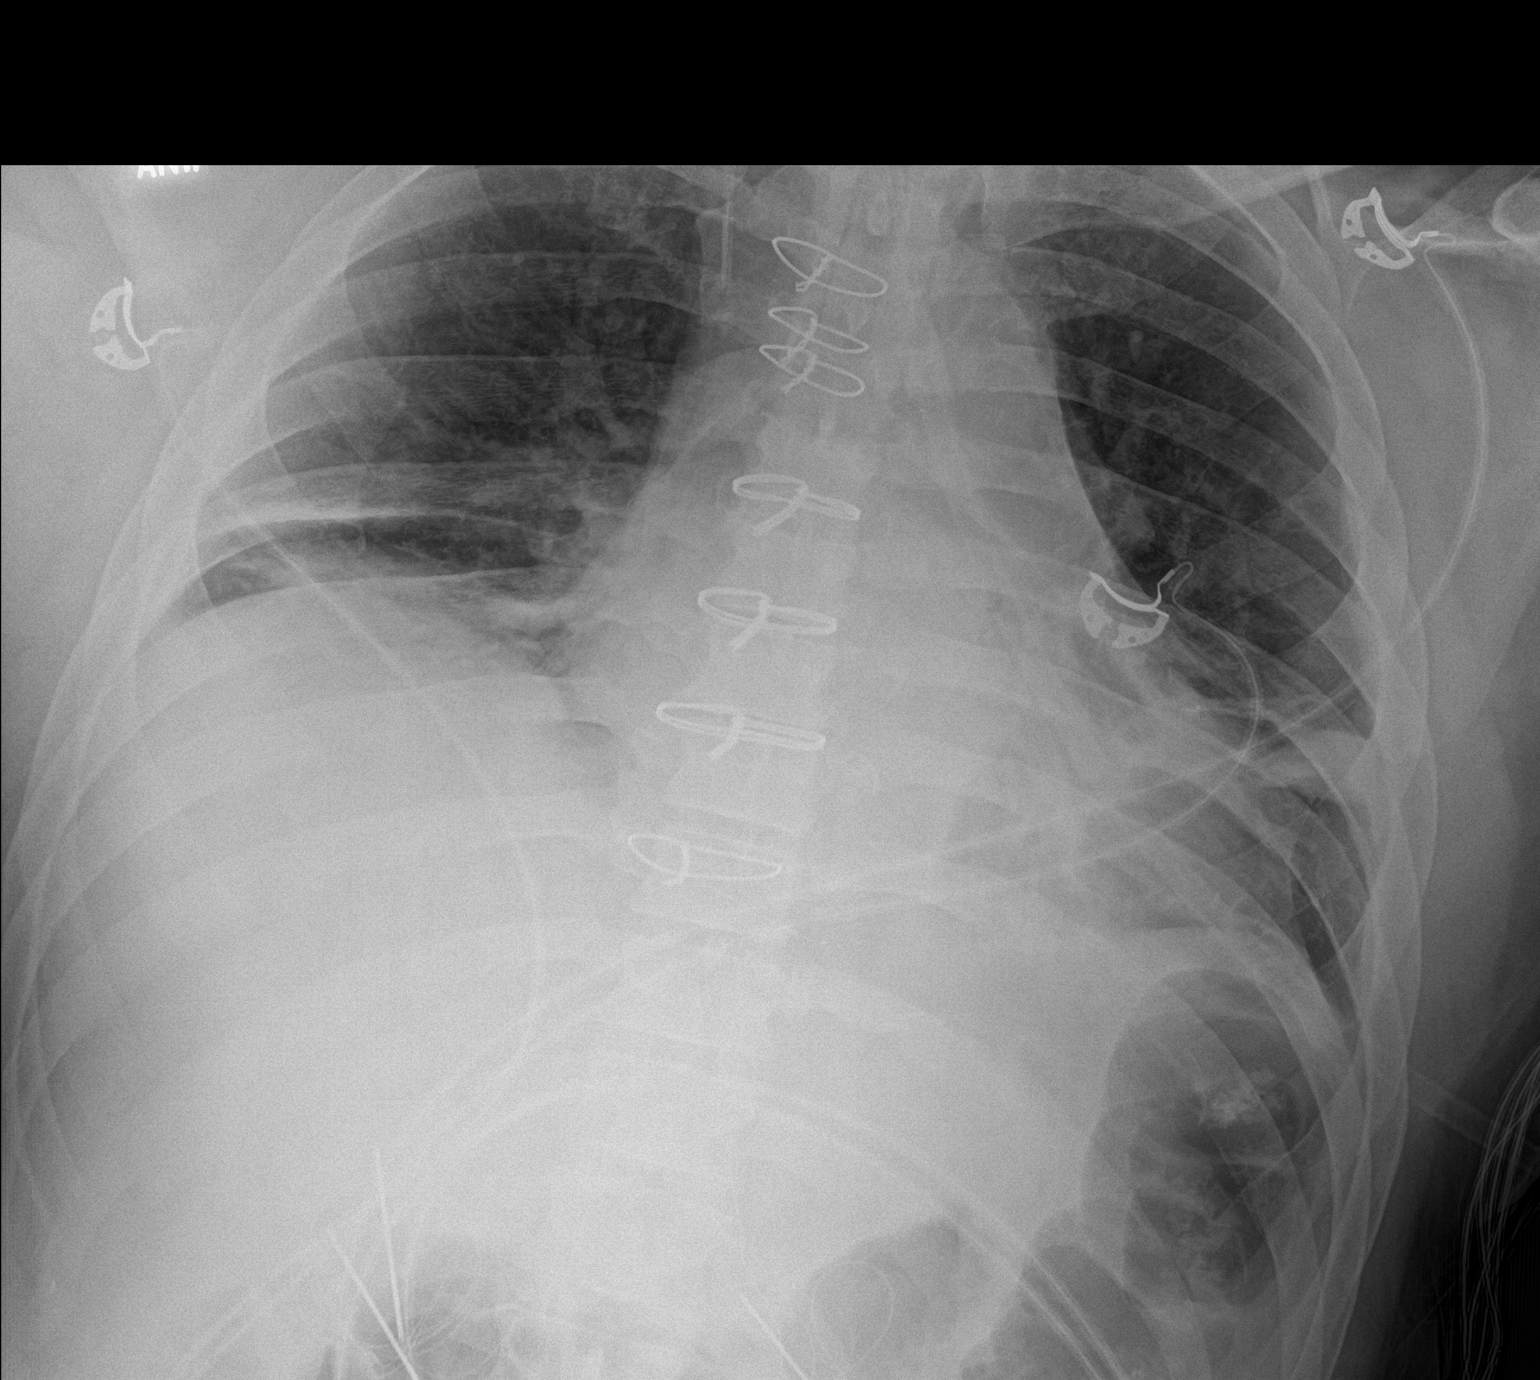

[1 of 1 positions shown; findings below may reference images not displayed]

FINDINGS: Interval removal of left-sided chest tube and mediastinal drain.
Prior median sternotomy. Unchanged right IJ sheath. Low lung volumes
with platelike atelectasis in the bilateral lung bases. Improving
aeration of the left lung compared to prior. No pneumothorax.
IMPRESSION: 1. Interval removal of left-sided chest tube and mediastinal drain.
No pneumothorax.
2. Improving aeration of the left lung compared to prior.

## 2021-04-04 ENCOUNTER — Ambulatory Visit (INDEPENDENT_AMBULATORY_CARE_PROVIDER_SITE_OTHER): Payer: 59 | Admitting: Internal Medicine

## 2021-04-04 ENCOUNTER — Other Ambulatory Visit: Payer: Self-pay

## 2021-04-04 ENCOUNTER — Encounter: Payer: Self-pay | Admitting: Internal Medicine

## 2021-04-04 VITALS — BP 133/89 | HR 83 | Wt 229.6 lb

## 2021-04-04 DIAGNOSIS — E119 Type 2 diabetes mellitus without complications: Secondary | ICD-10-CM

## 2021-04-04 DIAGNOSIS — I10 Essential (primary) hypertension: Secondary | ICD-10-CM

## 2021-04-04 LAB — POCT GLYCOSYLATED HEMOGLOBIN (HGB A1C): Hemoglobin A1C: 6.9 % — AB (ref 4.0–5.6)

## 2021-04-04 LAB — GLUCOSE, POCT (MANUAL RESULT ENTRY): POC Glucose: 157 mg/dl — AB (ref 70–99)

## 2021-04-04 NOTE — Progress Notes (Signed)
Subjective:    Mike Frazier - 40 y.o. male MRN [id number]  Date of birth: 03-14-1981  HPI  Mike Frazier is here for f/u chronic medical conditions.  Diabetes mellitus, Type 2 Disease Monitoring             Blood Sugar Ranges: <140s              Polyuria: no             Visual problems: no   Urine Microalbumin 10 (Spet 2021) --on Ace inhibitor   Last A1C: 6.7 (Dec 2021)   Medications: Metformin 1000 mg BID  Medication Compliance: yes  Medication Side Effects             Hypoglycemia: no    Chronic HTN Disease Monitoring:  Home BP Monitoring - Not monitoring  Chest pain- no  Dyspnea- no Headache - no  Medications: Coreg 6.25 mg BID, Lisinopril 20 mg   Compliance- yes Lightheadedness- no  Edema- no        Health Maintenance:  Health Maintenance Due  Topic Date Due  . COVID-19 Vaccine (1) Never done  . TETANUS/TDAP  Never done    -  reports that he quit smoking about 2 years ago. His smoking use included cigarettes. He has a 9.20 pack-year smoking history. He has never used smokeless tobacco. - Review of Systems: Per HPI. - Past Medical History: Patient Active Problem List   Diagnosis Date Noted  . 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 CAD (coronary artery disease) 06/14/2019  . CAD in native artery 12/18/2018  . Obesity 12/18/2018  . Hyperlipidemia with target LDL less than 70 12/12/2016  . Unstable angina (HCC) 10/01/2016  . Status post coronary artery stent placement   . NSTEMI (non-ST elevated myocardial infarction) (HCC) 09/30/2016  . Essential hypertension 09/28/2016   - Medications: reviewed and updated   Objective:   Physical Exam BP 133/89 (BP Location: Left Arm, Patient Position: Sitting)   Pulse 83   Wt 229 lb 9.6 oz (104.1 kg)   SpO2 95%   BMI 33.91 kg/m  Physical Exam Constitutional:      General: He is not in acute distress.    Appearance: He is not diaphoretic.  HENT:     Head: Normocephalic and atraumatic.  Eyes:      Conjunctiva/sclera: Conjunctivae normal.  Cardiovascular:     Rate and Rhythm: Normal rate and regular rhythm.     Heart sounds: Normal heart sounds. No murmur heard.   Pulmonary:     Effort: Pulmonary effort is normal. No respiratory distress.     Breath sounds: Normal breath sounds.  Musculoskeletal:        General: Normal range of motion.  Skin:    General: Skin is warm and dry.  Neurological:     Mental Status: He is alert and oriented to person, place, and time.  Psychiatric:        Mood and Affect: Affect normal.        Judgment: Judgment normal.            Assessment & Plan:   1. Type 2 diabetes mellitus without complication, without long-term current use of insulin (HCC) A1c well controlled with result of 6.9%. Continue current regimen.  Counseled on Diabetic diet, my plate method, 124 minutes of moderate intensity exercise/week Blood sugar logs with fasting goals of 80-120 mg/dl, random of less than 580 and in the event of sugars less than 60 mg/dl or  greater than 400 mg/dl encouraged to notify the clinic. Advised on the need for annual eye exams, annual foot exams, Pneumonia vaccine. - POCT glycosylated hemoglobin (Hb A1C) - POCT glucose (manual entry)  2. Essential hypertension BP well controlled. Asymptomatic. Continue current regimen.     Marcy Siren, D.O. 04/04/2021, 9:02 AM Primary Care at [facility]

## 2021-04-04 NOTE — Progress Notes (Signed)
Diabetes and HTN f/u

## 2021-04-26 ENCOUNTER — Ambulatory Visit: Payer: 59 | Admitting: Internal Medicine

## 2021-06-03 NOTE — Progress Notes (Deleted)
Cardiology Office Note:    Date:  06/03/2021   ID:  Mike Frazier, DOB [birth year], MRN [id number]  PCP:  Arvilla Market, DO  Cardiologist:  Lesleigh Noe, MD   Referring MD: Leary Roca*   No chief complaint on file.   History of Present Illness:    Mike Frazier is a 40 y.o. 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  ***  Past Medical History:  Diagnosis Date  . Asthma   . Coronary artery disease    a. NSTEMI  s/p DES stent to LAD in 09/2016. b. NSTEMI s/p DES to LAD in 11/2018, EF 55-65%.  . High cholesterol   . History of noncompliance with medical treatment, presenting hazards to health   . Hypertension   . Migraine    "grew out of them; sports related" (12/17/2018)  . NSTEMI (non-ST elevated myocardial infarction) (HCC) 08/2016; 12/16/2018  . Pre-diabetes   . Torsion, testicular     Past Surgical History:  Procedure Laterality Date  . CARDIAC CATHETERIZATION N/A 09/30/2016   Procedure: Left Heart Cath and Coronary Angiography;  Surgeon: Lyn Records, MD;  Location: [facility] INVASIVE CV LAB;  Service: Cardiovascular;  Laterality: N/A;  . CARDIAC CATHETERIZATION N/A 09/30/2016   Procedure: Intravascular Pressure Wire/FFR Study;  Surgeon: Lyn Records, MD;  Location: [facility] INVASIVE CV LAB;  Service: Cardiovascular;  Laterality: N/A;  . CARDIAC CATHETERIZATION N/A 09/30/2016   Procedure: Coronary Stent Intervention;  Surgeon: Lyn Records, MD;  Location: [facility] INVASIVE CV LAB;  Service: Cardiovascular;  Laterality: N/A;  . CORONARY ANGIOPLASTY WITH STENT PLACEMENT  12/17/2018  . CORONARY ARTERY BYPASS GRAFT N/A 06/16/2020   Procedure: CORONARY ARTERY BYPASS GRAFTING (CABG) LIMA to LAD.;  Surgeon: Delight Ovens, MD;  Location: [facility] OR;  Service: Open Heart Surgery;   Laterality: N/A;  OFF-PUMP  . I & D EXTREMITY  10/21/2012   Procedure: IRRIGATION AND DEBRIDEMENT EXTREMITY;  Surgeon: Marlowe Shores, MD;  Location: [facility] OR;  Service: Orthopedics;  Laterality: Left;  . INCISION / DRAINAGE HAND / FINGER Right    "bit by spider twice; once on left thumb; once on right middle finger" (12/17/2018  . LEFT HEART CATH AND CORONARY ANGIOGRAPHY N/A 12/17/2018   Procedure: LEFT HEART CATH AND CORONARY ANGIOGRAPHY;  Surgeon: Runell Gess, MD;  Location: [facility] INVASIVE CV LAB;  Service: Cardiovascular;  Laterality: N/A;  . LEFT HEART CATH AND CORONARY ANGIOGRAPHY N/A 06/12/2020   Procedure: LEFT HEART CATH AND CORONARY ANGIOGRAPHY;  Surgeon: Tonny Bollman, MD;  Location: [facility] INVASIVE CV LAB;  Service: Cardiovascular;  Laterality: N/A;  . TEE WITHOUT CARDIOVERSION N/A 06/16/2020   Procedure: TRANSESOPHAGEAL ECHOCARDIOGRAM (TEE);  Surgeon: Delight Ovens, MD;  Location: [facility] OR;  Service: Open Heart Surgery;  Laterality: N/A;  . TESTICLE TORSION REDUCTION  ~ 1997    Current Medications: No outpatient medications have been marked as taking for the 06/04/21 encounter (Appointment) with Lyn Records, MD.     Allergies:   Codeine   Social History   Socioeconomic History  . Marital status: Single    Spouse name: Not on file  . Number of children: Not on file  . Years of education: Not on file  . Highest education level: Not on file  Occupational History  . Occupation: works at [employer]  Tobacco Use  . Smoking status: Former Smoker    Packs/day: 0.40    Years: 23.00    Pack years: 9.20    Types: Cigarettes    Quit date: 12/17/2018    Years since quitting: 2.4  . Smokeless tobacco: Never Used  Vaping Use  . Vaping Use: Never used  Substance and Sexual Activity  . Alcohol use: Yes    Comment: 12/17/2018 "maybe 6 beer/smonth; if that"  . Drug use: Yes    Types: Marijuana    Comment: 12/17/2018 "maybe 2 times/wk"  . Sexual activity: Yes   Other Topics Concern  . Not on file  Social History Narrative  . Not on file   Social Determinants of Health   Financial Resource Strain: Not on file  Food Insecurity: Not on file  Transportation Needs: Not on file  Physical Activity: Not on file  Stress: Not on file  Social Connections: Not on file     Family History: The patient's family history includes Heart attack in his sister; Hypertension in his maternal grandmother and mother.  ROS:   Please see the history of present illness.    *** All other systems reviewed and are negative.  EKGs/Labs/Other Studies Reviewed:    The following studies were reviewed today: ***  EKG:  EKG ***  Recent Labs: 06/17/2020: Magnesium 1.9 06/20/2020: Hemoglobin 15.3; Platelets 290 08/30/2020: BUN 13; Creatinine, Ser 1.16; Potassium 4.9; Sodium 133  Recent Lipid Panel    Component Value Date/Time   CHOL 137 01/20/2020 0950   TRIG 105 01/20/2020 0950   HDL 49 01/20/2020 0950   CHOLHDL 2.8 01/20/2020 0950   CHOLHDL 3.1 12/16/2018 1830   VLDL 34 12/16/2018 1830   LDLCALC 69 01/20/2020 0950    Physical Exam:    VS:  There were no vitals taken for this visit.    Wt Readings from Last 3 Encounters:  04/04/21 229 lb 9.6 oz (104.1 kg)  12/08/20 226 lb (102.5 kg)  12/04/20 226 lb (102.5 kg)     GEN: ***. No acute distress HEENT: Normal NECK: No JVD. LYMPHATICS: No lymphadenopathy CARDIAC: *** murmur. RRR *** gallop, or edema. VASCULAR: *** Normal Pulses. No bruits. RESPIRATORY:  Clear to auscultation without rales, wheezing or rhonchi  ABDOMEN: Soft, non-tender, non-distended, No pulsatile mass, MUSCULOSKELETAL: No deformity  SKIN: Warm and dry NEUROLOGIC:  Alert and oriented x 3 PSYCHIATRIC:  Normal affect   ASSESSMENT:    1. Coronary artery disease involving coronary bypass graft of native heart without angina pectoris   2. Hyperlipidemia with target LDL less than 70   3. Essential hypertension   4. Essential (primary)  hypertension   5. Tobacco abuse   6. Elevated hemoglobin A1c    PLAN:    In order of problems listed above:  1. ***   Medication Adjustments/Labs and Tests Ordered: Current medicines are reviewed at length with the patient today.  Concerns regarding medicines are outlined above.  No orders of the defined types were placed in this encounter.  No orders of the defined types were placed in this encounter.   There are no Patient Instructions on file for this visit.   Signed, Lesleigh Noe, MD  06/03/2021 10:13 AM    [facility]

## 2021-06-04 ENCOUNTER — Ambulatory Visit: Payer: 59 | Admitting: Interventional Cardiology

## 2021-06-04 DIAGNOSIS — I25810 Atherosclerosis of coronary artery bypass graft(s) without angina pectoris: Secondary | ICD-10-CM

## 2021-06-04 DIAGNOSIS — E785 Hyperlipidemia, unspecified: Secondary | ICD-10-CM

## 2021-06-04 DIAGNOSIS — Z720 Tobacco use: Secondary | ICD-10-CM

## 2021-06-04 DIAGNOSIS — R7309 Other abnormal glucose: Secondary | ICD-10-CM

## 2021-06-04 DIAGNOSIS — I10 Essential (primary) hypertension: Secondary | ICD-10-CM

## 2021-06-21 ENCOUNTER — Other Ambulatory Visit: Payer: Self-pay

## 2021-06-21 ENCOUNTER — Telehealth: Payer: Self-pay | Admitting: Internal Medicine

## 2021-06-21 DIAGNOSIS — E119 Type 2 diabetes mellitus without complications: Secondary | ICD-10-CM

## 2021-06-21 DIAGNOSIS — I10 Essential (primary) hypertension: Secondary | ICD-10-CM

## 2021-06-21 DIAGNOSIS — E785 Hyperlipidemia, unspecified: Secondary | ICD-10-CM

## 2021-06-21 MED ORDER — LISINOPRIL 20 MG PO TABS: ORAL_TABLET | Freq: Every day | ORAL | 0 refills | Status: AC

## 2021-06-21 MED ORDER — ATORVASTATIN CALCIUM 80 MG PO TABS: ORAL_TABLET | ORAL | 0 refills | Status: AC

## 2021-06-21 MED ORDER — METFORMIN HCL 500 MG PO TABS: ORAL_TABLET | Freq: Two times a day (BID) | ORAL | 0 refills | Status: AC

## 2021-06-21 MED ORDER — CARVEDILOL 6.25 MG PO TABS: ORAL_TABLET | Freq: Two times a day (BID) | ORAL | 0 refills | Status: AC

## 2021-06-21 NOTE — Telephone Encounter (Signed)
1) Medication(s) Requested (by name):  lisinopril (ZESTRIL) 20 MG tablet  carvedilol (COREG) 6.25 MG tablet   atorvastatin (LIPITOR) 80 MG tablet  metFORMIN (GLUCOPHAGE) 500 MG tablet    Pharmacy [facility] [address] [place], [place] [address] [phone]

## 2021-06-21 NOTE — Progress Notes (Signed)
Lisinopril, metformin, carvedilol and atorvastatin refilled

## 2021-09-19 ENCOUNTER — Other Ambulatory Visit: Payer: Self-pay

## 2021-09-29 NOTE — Progress Notes (Signed)
Erroneous encounter

## 2021-10-03 ENCOUNTER — Encounter: Payer: 59 | Admitting: Family

## 2021-10-03 DIAGNOSIS — I10 Essential (primary) hypertension: Secondary | ICD-10-CM

## 2021-10-03 DIAGNOSIS — E119 Type 2 diabetes mellitus without complications: Secondary | ICD-10-CM

## 2021-10-04 ENCOUNTER — Ambulatory Visit: Payer: 59 | Admitting: Internal Medicine
